# Patient Record
Sex: Male | Born: 1940 | Race: White | Hispanic: No | Marital: Married
Health system: Midwestern US, Community
[De-identification: ages and names within clinical notes are randomized; demographics above are authoritative.]

## PROBLEM LIST (undated history)

## (undated) DIAGNOSIS — J439 Emphysema, unspecified: Secondary | ICD-10-CM

## (undated) DIAGNOSIS — C61 Malignant neoplasm of prostate: Secondary | ICD-10-CM

## (undated) DIAGNOSIS — J449 Chronic obstructive pulmonary disease, unspecified: Secondary | ICD-10-CM

## (undated) DIAGNOSIS — I499 Cardiac arrhythmia, unspecified: Secondary | ICD-10-CM

## (undated) DIAGNOSIS — I4891 Unspecified atrial fibrillation: Secondary | ICD-10-CM

## (undated) DIAGNOSIS — R0600 Dyspnea, unspecified: Secondary | ICD-10-CM

## (undated) DIAGNOSIS — N390 Urinary tract infection, site not specified: Secondary | ICD-10-CM

## (undated) DIAGNOSIS — R3915 Urgency of urination: Secondary | ICD-10-CM

## (undated) HISTORY — PX: TONSILLECTOMY: SUR1361

## (undated) HISTORY — PX: EYE SURGERY: SHX253

## (undated) HISTORY — PX: RADIOACTIVE SEED IMPLANT: SHX5150

## (undated) HISTORY — PX: HAND SURGERY: SHX662

## (undated) HISTORY — PX: COLONOSCOPY: SHX174

---

## 2007-10-07 LAB — PSA SCREENING (SCREENING): Prostate Specific Ag: 3.06

## 2009-02-21 LAB — PSA SCREENING (SCREENING)

## 2009-06-19 LAB — CBC W/O DIFF
HCT: 46.5 % (ref 37.0–49.0)
HGB: 15.9 g/dL (ref 13.0–16.0)
MCH: 31.8 PG (ref 25.0–35.0)
MCHC: 34.2 g/dL (ref 31.0–37.0)
MCV: 93.1 FL (ref 78.0–98.0)
MPV: 7 FL — ABNORMAL LOW (ref 7.4–10.4)
PLATELET: 269 10*3/uL (ref 130–400)
RBC: 4.99 M/uL (ref 4.50–5.30)
RDW: 13.4 % (ref 11.5–14.5)
WBC: 5.9 10*3/uL (ref 4.5–13.0)

## 2009-06-19 LAB — METABOLIC PANEL, BASIC
Anion gap: 6 mmol/L (ref 5–15)
BUN/Creatinine ratio: 17 (ref 12–20)
BUN: 15 MG/DL (ref 7–18)
CO2: 27 MMOL/L (ref 21–32)
Calcium: 9 MG/DL (ref 8.4–10.4)
Chloride: 104 MMOL/L (ref 100–108)
Creatinine: 0.9 MG/DL (ref 0.6–1.3)
GFR est AA: >60 mL/min/{1.73_m2} (ref >60)
GFR est non-AA: >60 mL/min/{1.73_m2} (ref >60)
Glucose: 93 MG/DL (ref 74–99)
Potassium: 4.1 MMOL/L (ref 3.5–5.5)
Sodium: 137 MMOL/L (ref 136–145)

## 2009-06-25 NOTE — Op Note (Unsigned)
[facility]   [address] [place], [place] [address]     OPERATIVE REPORT    PATIENT: Cory Meyers, Cory Meyers  MRN [id number] DATE: 06/24/2009  BILLING: [id number] LOCATION: [id number]  ATTENDING: Donah Driver, MD  SURGEON: Donah Driver, MD      PREOPERATIVE DIAGNOSIS: Adenocarcinoma of the prostate, Gleason 6.    POSTOPERATIVE DIAGNOSIS: Adenocarcinoma of the prostate, Gleason 6.    PROCEDURE: Transperineal, percutaneous, ultrasound-guided radioactive seed  implant prostate utilizing I-125 seeds.    SURGEON: Donah Driver, MD    ANESTHESIA: General.    COMPLICATIONS: None.    SPECIMENS: None.    ESTIMATED BLOOD LOSS: Less than 25 mL.    DRAINS: None.    INTRAOPERATIVE FINDINGS: I-125 prostate seed implant performed. The office  volume was 46 cc, the OR volume was 41.6 cc. A total of 84 seeds were  placed via 26 needles for a total energy of 32.256 millicuries. There were  no seeds noted in the bladder, and there is good seed distribution noted on  biplanar fluoroscopy and prostate ultrasound. The patient tolerated the  procedure well.    INDICATIONS: This is an 69 year old male who has undergone evaluation by  his primary urologist, Dr. Mathews Argyle, and has decided on radioactive seed  implant. The patient now desires the seed implant today. He has been on  hormonal therapy to shrink his prostate and now presents for seed implant.    PROCEDURE IN DETAIL: The patient was taken to the cystoscopy suite, placed  on the cysto table in the supine position. General anesthesia was  administered. After adequate anesthesia, the patient was placed in the  dorsal lithotomy position, prepped and draped in the usual sterile fashion  using adequate padding for the extended dorsal lithotomy position and after  ensuring that there were no pressure points, transrectal ultrasonography  was performed using the [facility] transrectal ultrasound machine. After adequate   adjusting of the ultrasound armatures, and placing the needle guides for  the subsequent perineal placement, transverse imaging of the prostate was  performed from the base of the prostate to the apex. This gave [place] an  adequate volume and then longitudinal measurements were taken in 3 separate  positionings of the prostate to get the adequate numbers for dosimetry.    After adequate dosimetry, the 84 implants were prepared. Using the  ultrasound in longitudinal and transverse plane, the needles were first  placed peripherally in the peripheral zone of the prostate. Care was taken  to avoid rectal injury. After adequate needle placement, the seeds were  then implanted by Dr. Delton Prairie, per his separate dictation. Following this,  internal seeds were placed in a U-shape fashion with care to avoid the  urethra. The seeds were then implanted there as well. After adequate seed  placement, fluoroscopic visualization of the seeds was performed. There was  adequate seed placement with no obvious cold spots. With the Foley catheter  in place, dynamic cystogram was performed showing no evidence of seeds in  the bladder. The bladder was then drained. The Foley catheter was removed  and the patient was taken to the recovery room in stable condition.                               Date:______Time:______Signature________________________________   Donah Driver, MD    WHR:wmx  D: 06/24/2009 5:45 P T: 06/25/2009 2:48 A  Job#: [id number]  CScriptDoc #: [id number]  cc: Page Spiro, MD   Donah Driver, MD

## 2009-10-10 LAB — PSA SCREENING (SCREENING): Prostate Specific Ag: 0.03

## 2010-01-20 LAB — PSA SCREENING (SCREENING): Prostate Specific Ag: 0.13

## 2010-05-05 LAB — PSA, DIAGNOSTIC (PROSTATE SPECIFIC AG): Prostate Specific Ag: 0.18

## 2010-07-31 ENCOUNTER — Telehealth

## 2010-07-31 NOTE — Telephone Encounter (Signed)
Needs order for PSA mailed to patient

## 2010-08-13 LAB — PSA, DIAGNOSTIC (PROSTATE SPECIFIC AG): Prostate Specific Ag: 0.27

## 2010-08-25 LAB — AMB POC URINALYSIS DIP STICK AUTO W/O MICRO
Bilirubin (UA POC): NEGATIVE
Blood (UA POC): NEGATIVE
Glucose (UA POC): NEGATIVE
Ketones (UA POC): NEGATIVE
Nitrites (UA POC): NEGATIVE
Protein (UA POC): NEGATIVE mg/dL
Specific gravity (UA POC): 1.025 (ref 1.001–1.035)
Urobilinogen (UA POC): 0.2
pH (UA POC): 5.5 (ref 4.6–8.0)

## 2010-08-25 MED ORDER — TAMSULOSIN SR 0.4 MG 24 HR CAP
0.4 mg | ORAL_CAPSULE | Freq: Every day | ORAL | Status: DC
Start: 2010-08-25 — End: 2011-02-18

## 2010-08-25 NOTE — Patient Instructions (Signed)
MyChart Activation    Thank you for requesting access to MyChart. Please follow the instructions below to securely access and download your online medical record. MyChart allows you to send messages to your doctor, view your test results, renew your prescriptions, schedule appointments, and more.    How Do I Sign Up?    1. In your internet browser, go to [email or web address]  2. Click on the First Time User? Click Here link in the Sign In box. You will be redirect to the New Member Sign Up page.  3. Enter your MyChart Access Code exactly as it appears below. You will not need to use this code after you???ve completed the sign-up process. If you do not sign up before the expiration date, you must request a new code.    MyChart Access Code: [email or web address]  Expires: 11/23/2010 11:01 AM (This is the date your MyChart access code will expire)    4. Enter the last four digits of your Social Security Number (xxxx) and Date of Birth (mm/dd/yyyy) as indicated and click Submit. You will be taken to the next sign-up page.  5. Create a MyChart ID. This will be your MyChart login ID and cannot be changed, so think of one that is secure and easy to remember.  6. Create a MyChart password. You can change your password at any time.  7. Enter your Password Reset Question and Answer. This can be used at a later time if you forget your password.   8. Enter your e-mail address. You will receive e-mail notification when new information is available in MyChart.  9. Click Sign Up. You can now view and download portions of your medical record.  10. Click the Download Summary menu link to download a portable copy of your medical information.    Additional Information    If you have questions, please call [phone]. Remember, MyChart is NOT to be used for urgent needs. For medical emergencies, dial 911.

## 2010-08-25 NOTE — Progress Notes (Signed)
Assessment/Plan:             1. Malignant neoplasm of prostate  AMB POC URINALYSIS DIP STICK AUTO W/O MICRO   2. Urinary urgency  tamsulosin (FLOMAX) 0.4 mg capsule   3. Impotence of organic origin  AMB POC URINALYSIS DIP STICK AUTO W/O MICRO   4. Urine leukocytes  CULTURE, URINE     Return to clinic 3 month(s) for PSA    Discussion:   Can stop ditropan and reduced flomax to every day.        Subjective:   Cory Meyers is a 70 y.o. Caucasian male who is seen for follow up of CAP s/p I 125 seeds on 06/24/09.  Received LHRH 22.5mg  on 05/27/10.    Since the last visit, voiding without problems.    Associated Signs/Symptoms: nocturia x  1.  Minimal urgency symptoms.  On flomax bid and ditropan XL 5mg  every day.    Used Staxyn for ED but caused ? Tachycardia.      Past Medical History   Diagnosis Date   ??? Hypercholesteremia    ??? Hypertension    ??? COPD (chronic obstructive pulmonary disease)    ??? Malignant neoplasm of prostate 04/01/09           ??? Emphysema    ??? Recurrent UTI    ??? Slow urinary stream    ??? Urinary frequency      Past Surgical History   Procedure Date   ??? Hx septoplasty    ??? Pr finger tendon transfer,4-5 fingrs    ??? Biopsy prostate 04/01/09   ??? Pr surg expos,prostate,radioactiv insrt    ??? Hx tonsillectomy      No family history on file.  History     Social History   ??? Marital Status: Unknown     Spouse Name: N/A     Number of Children: N/A   ??? Years of Education: N/A     Occupational History   ??? Not on file.     Social History Main Topics   ??? Smoking status: Former Smoker -- 3.0 packs/day for 38 years     Quit date: 06/09/1995   ??? Smokeless tobacco: Never Used   ??? Alcohol Use: Yes   ??? Drug Use: No   ??? Sexually Active: Not on file     Other Topics Concern   ??? Not on file     Social History Narrative   ??? No narrative on file     Current Outpatient Prescriptions   Medication Sig   ??? diltiazem hcl 120 mg Tb24 Take  by mouth.     ??? diltiazem CD (CARDIZEM CD) 120 mg ER capsule Take  by mouth daily.      ??? tamsulosin (FLOMAX) 0.4 mg capsule Take 1 Cap by mouth daily.   ??? irbesartan (AVAPRO) 150 mg tablet Take 150 mg by mouth nightly.     ??? ezetimibe-simvastatin (VYTORIN 10/40) 10-40 mg per tablet Take 1 Tab by mouth nightly.     ??? tiotropium (SPIRIVA WITH HANDIHALER) 18 mcg inhalation capsule Take 1 Cap by inhalation daily.     ??? ASCORBATE CALCIUM (VITAMIN C PO) Take  by mouth.     ??? GLUC SU/CHONDRO SU A/VIT C/MN (GLUCOSAMINE CHONDROITIN MAXSTR PO) Take  by mouth.     ??? CA CITRATE/MGOX/VIT D3/B6/MIN (CALCIUM CITRATE + D WITH MAG PO) Take  by mouth.     ??? FOLIC ACID PO Take  by mouth.     ???  MULTIVITS-MINERALS/FA/LYCOPENE (ONE-A-DAY MEN'S PO) Take  by mouth.     ??? MILK THISTLE PO Take  by mouth.       No Known Allergies    Review of Systems    Constitutional: negative for fever, malaise/fatigue, weakness and weight loss  Eyes: negative for blurred vision and double vision  Ears, nose, mouth, throat, and face: negative for congestion and headaches  Respiratory: negative for cough, shortness of breath or sputum production  Cardiovascular: negative for chest pain and leg swelling  Gastrointestinal: negative for abdominal pain, diarrhea, nausea and vomiting  Skin: negative for itching and rash  Hematologic/lymphatic: negative for easy bruising/bleeding  Musculoskeletal:negative for joint pain and myalgias  Neurological: negative for dizziness, focal weakness and LOC      Objective:     Estimated Body mass index is 26.58 kg/(m^2) as calculated from the following:    Height as of this encounter: 5\' 9" (1.753 m).    Weight as of this encounter: 180 lb(81.647 kg).   BP 140/78   Ht 5\' 9"  (1.753 m)   Wt 180 lb (81.647 kg)   BMI 26.58 kg/m2  General appearance: alert, cooperative, no distress, appears stated age  Head: Normocephalic, without obvious abnormality, atraumatic  Male genitalia: normal  Rectal: Normal tone, fla firm prostate, no masses or tenderness  Extremities: extremities normal, atraumatic, no cyanosis or edema   Skin: Skin color, texture, turgor normal. No rashes or lesions    Results for orders placed in visit on 08/25/10   AMB POC URINALYSIS DIP STICK AUTO W/O MICRO       Component Value Range    Color Yellow  (none)     Clarity Clear  (none)     Glucose Negative  (none)     Bilirubin Negative  (none)     Ketones Negative  (none)     Spec.Grav. 1.025  1.001 - 1.035     Blood Negative  (none)     pH 5.5  4.6 - 8.0     Protein Neg  NWG:NFAOZHYQ(MV/HQ)    Urobilinogen 0.2 mg/dL      Nitrites Negative  (none)     Leukocyte esterase Trace  (none)          PSA /TESTOSTERONE - BSHSI PSA   Latest Ref Rng    02/21/2009 6.63(A)   06/19/2009    10/10/2009 0.03   01/20/2010 0.13   08/13/10    0.27    This note has been sent to the referring physician, with findings and recommendations.      Page Spiro, MD, MD, FACS          CC:  Page Spiro, MD  [facility] [address]  [place] [address]

## 2010-08-27 MED ORDER — NITROFURANTOIN (25% MACROCRYSTAL FORM) 100 MG CAP
100 mg | ORAL_CAPSULE | Freq: Two times a day (BID) | ORAL | Status: AC
Start: 2010-08-27 — End: 2010-09-03

## 2010-08-27 NOTE — Telephone Encounter (Signed)
Enterococcus UTI,  macrobid bid x 7 days.  Rx faxed to pharmacy of choice, please notify patient to pick up and take as directed

## 2010-08-28 NOTE — Telephone Encounter (Signed)
Pt told

## 2010-09-25 NOTE — Telephone Encounter (Signed)
Patient called wanting to let us know that he had a bladder infection and that he went to [facility]  Was given Cipro and they will forward a copy of the result to [place]

## 2010-10-03 ENCOUNTER — Telehealth

## 2010-10-03 NOTE — Telephone Encounter (Signed)
Pt called and left a message stating that the Cipro did not help with his UTI, he is still having same sx's (back pain, bladder pressure).  Tried calling pt back, will try again on Monday - pt needs a repeat culture per Dr Verdie Mosher (his uti was sensitive to Cipro and the Cipro should have worked).

## 2010-10-06 NOTE — Telephone Encounter (Signed)
Spoke to patient and faxed over a request for urine culture to be done at [facility].  Patient is concern about how freq. He is going to the bathroom.  I instructed the patient get a urine culture at Dr. Theodora Blow request and try AZO for symptoms until we can get the results back.  Patient understood.

## 2010-10-10 MED ORDER — AMPICILLIN 500 MG CAP
500 mg | ORAL_CAPSULE | Freq: Four times a day (QID) | ORAL | Status: AC
Start: 2010-10-10 — End: 2010-10-20

## 2010-10-10 NOTE — Telephone Encounter (Signed)
Called pt with urine cx results reviewed by Dr Janalyn Rouse - faxed Ampicillin 500 mg qid x 10 days.

## 2010-10-22 LAB — AMB POC URINALYSIS DIP STICK AUTO W/O MICRO
Bilirubin (UA POC): NEGATIVE
Blood (UA POC): NEGATIVE
Glucose (UA POC): NEGATIVE
Ketones (UA POC): NEGATIVE
Leukocyte esterase (UA POC): NEGATIVE
Nitrites (UA POC): NEGATIVE
Protein (UA POC): NEGATIVE mg/dL
Specific gravity (UA POC): 1.02 (ref 1.001–1.035)
Urobilinogen (UA POC): 1
pH (UA POC): 7 (ref 4.6–8.0)

## 2010-10-22 LAB — AMB POC PVR, MEAS,POST-VOID RES,US,NON-IMAGING: PVR: 60 cc

## 2010-10-22 MED ORDER — OXYBUTYNIN CHLORIDE 5 MG TAB
5 mg | ORAL_TABLET | Freq: Two times a day (BID) | ORAL | Status: DC
Start: 2010-10-22 — End: 2010-12-29

## 2010-10-22 NOTE — Progress Notes (Signed)
Progress Notes     Page Spiro, MD  01/29/10 11:15 AM  Signed  70 y.o. White Male follow up for his prostate cancer.  H/o elevated PSA 6.63 on 02/21/09. Underwent PNBx by Dr. Lillia Corporal on 04/01/09 and found to have CaP gleason 3+3 from left mid gland, clinical T1c.  S/p I 152 seeds on 06/24/09.   C/o persistent increased urinary frequency/urgency with bothersome urgency to urinate or deficate.  Nocturia x 1 On flomax bid.        Had enterococcus UTI's in March treated with macrobid and treated for another UTI per PCP approx 3 weeks ago for dysuria/urgency .  S/p unremarkable  diagnostic cysto 10/18/2009.            Past Medical History    Diagnosis  Date    ???  Hypercholesteremia      ???  Hypertension      ???  COPD      ???  Malignant neoplasm of prostate  04-01-09        Gleason 6(3+3) T1c    ???  Emphysema      ???  Recurrent UTI      ???  Slow urinary stream      ???  Urinary frequency                Past Surgical History    Procedure  Date    ???  Hx septoplasty      ???  Pr finger tendon transfer,4-5 fingrs          right    ???  Biopsy prostate  04-01-09        Gleason 6(3+3)Left mid, Dr Onalee Hua    ???  Pr surg expos,prostate,radioactiv insrt          [facility], I-125 seed implant, Dr Janalyn Rouse    ???  Hx tonsillectomy                Family History    Problem  Relation  Age of Onset    ???  Heart Disease  Father      ???  Stroke  Father      ???  Cancer  Mother      ???  Cancer  Sister      ???  Cancer, Breast  Sister                History    Social History    ???  Marital Status:  N/A        Spouse Name:  N/A        Number of Children:  N/A    ???  Years of Education:  N/A    Occupational History    ???  Not on file.    Social History Main Topics    ???  Tobacco Use:  Not on file    ???  Alcohol Use:  Not on file    ???  Drug Use:  Not on file    ???  Sexually Active:  Not on file    Other Topics  Concern    ???  Not on file    Social History Narrative    ???  No narrative on file              Current outpatient prescriptions    Medication  Sig  Dispense  Refill     ???  irbesartan (AVAPRO) 150 mg PO TABS  Take 150 mg by  Mouth Once a Day.          ???  tamsulosin (FLOMAX) 0.4 mg PO CP24  Take 1 Cap by Mouth 2 Times Daily After Meals. One capsule daily 30 minutes after supper   180 Cap   3    ???  Ezetimibe-Simvastatin (VYTORIN 10/40) 10-40 mg PO TABS  Take 1 Tab by Mouth.          ???  tiotropium (SPIRIVA WITH HANDIHALER) 18 mcg INH CpDv  Take 1 Cap inhaled by mouth Once a Day.          ???  ASCORBIC ACID (VITAMIN C PO)  Take  by Mouth.          ???  Glucosamine-Chondroit-Vit C-Mn (GLUCOSAMINE CHONDROITIN MAXSTR) 500-400 mg PO CAPS  Take 1 Cap by Mouth Twice Daily.          ???  CA CITRATE/MGOX/VIT D3/B6/MIN (CALCIUM CITRATE + D WITH MAG PO)  Take  by Mouth.          ???  FOLIC ACID PO  Take  by Mouth.          ???  MULTIVITAMIN (ONE-A-DAY MENS PO)  Take  by Mouth.          ???  MILK THISTLE PO  Take  by Mouth.                        No Known Allergies          Review of System:      Constitutional: negative for fever, malaise/fatigue, weakness and weight loss  Eyes: negative for blurred vision and double vision  Ears, nose, mouth, throat, and face: negative for congestion and headaches  Respiratory: negative for cough, shortness of breath or sputum production  Cardiovascular: negative for chest pain and leg swelling  Gastrointestinal: negative for abdominal pain, diarrhea, nausea and vomiting  Integument/breast: negative for itching and rash  Hematologic/lymphatic: negative for easy bruising/bleeding  Musculoskeletal:negative for falls and myalgias  Neurological: negative for dizziness, focal weakness and LOC          Physical Exam:      Constitutional: WDWN, Pleasant and appropriate affect and No acute distress  HEENT:  Symmetric and Supple  Resp: Normal effort  CV:  No peripheral swelling noted  Abdomen:  No abdominal masses or tenderness.  GU: normal circ phallus and testes  DRE: firm nontender prostate, 20g   Skin: Normal color and texture and No rashes or erythema noted   Neuro/Psych:  Alert and Oriented x3, affect appropriate.                  Data:  PSA DIAGNOSTIC    Date  Value  Range  Status    01/20/2010  0.13   0.0-4.0 (ng/mL)  Final    10/10/2009  0.03   0.0-4.0 (ng/mL)  Final    02/21/2009  6.63*  0.0-4.0 (ng/mL)  Final    10/07/2007  3.06   0.0-4.0 (ng/mL)  Final          PSA  0.03       Results for orders placed in visit on 01/29/10    URINALYSIS    Component  Value  Range    ???  SPECIMEN TYPE  urine       ???  URINE COLOR  yellow       ???  URINE TURBIDITY          ???  URINE GLUCOSE  neg   Negative - Negative (mg/dL)    ???  URINE BILIRUBIN  neg   Negative - Negative (Positive/Negative)    ???  URINE KETONES  neg   Negative - Negative (mg/dL)    ???  URINE SPECIFIC GRAVITY  1.020   1.003 - 1.030     ???  URINE OCCULT BLOOD  neg   Negative - Negative (Positive/Negative)    ???  URINE pH  7.5 (*)  5.0 - 7.0     ???  URINE PROTEIN SCREEN  neg   Negative - Negative (mg/dL)    ???  URINE UROBILINOGEN  1.0   0.3 - 2.0 (mg/dL)    ???  URINE NITRITE  neg   Negative - Negative (Positive/Negative)    ???  URINE LEUKOCYTE ESTERASE  neg   Negative - Negative (Positive/Negative)    UVA SYMPTOMS SCORE    Component  Value  Range    ???  IBE UVA  2   0 - 5     ???  FREQUENCY UVA  4   0 - 5     ???  INTERMITTENCY UVA  3   0 - 5     ???  URGENCY UVA  5   0 - 5     ???  WEAK STREAM UVA  1   0 - 5     ???  STRANGURIA UVA  0   0 - 5     ???  NOCTURIA UVA  1   0 - >=5 (times)    ???  TOTAL UVA SYMPTOMS SCORE  16   0 - 35     ???  BOTHER UVA  4   0 - 6     PR MEAS, POST-VOID RES, [place], NON-IMAGING    Component  Value  Range    ???  PVR  33 ml             08/26/09 CULTURE FLAG (Abnormal): FINAL REPORT: GREATER THAN 100,000 ORG/ML ENTEROCOCCUS SPECIES ORGANISM: ENTEROCOCCUS SPECIES      01/13/10  Urine cultureEnterococcus          Impression/Plan:      1.  Malignant neoplasm of prostate (185)   Machine Read Dip W/O Micro    2.  Urinary urgency (788.63A)   Machine Read Dip W/O Micro, UVA Symptoms Score, Measure RU Non-Image U/S     3.  Urinary frequency (788.41)   Machine Read Dip W/O Micro, UVA Symptoms Score, Measure RU Non-Image U/S          CaP gleason 3+3  1/12 cores from left mid, clinical T1c  S/p I 125 seeds on 06/24/09.   postop LUTS on flomax bid.  Will add vesicare 5mg  qd for irritative symptoms  F/u in 3 months with repeat PSA      H/o recurrent enterococcus UT, unremarkable cysto 10/18/09.  Will check CT urogram                 CC:  Diona Fanti  [address]  [place] [address]

## 2010-10-22 NOTE — Progress Notes (Signed)
Assessment/Plan:             1. Recurrent UTI  CULTURE, URINE   2. Dysuria  AMB POC URINALYSIS DIP STICK AUTO W/O MICRO, AMB POC PVR, MEAS,POST-VOID RES,US,NON-IMAGING, CULTURE, URINE   3. Urinary frequency  AMB POC URINALYSIS DIP STICK AUTO W/O MICRO, AMB POC PVR, MEAS,POST-VOID RES,US,NON-IMAGING   4. Urgency of urination  AMB POC URINALYSIS DIP STICK AUTO W/O MICRO, AMB POC PVR, MEAS,POST-VOID RES,US,NON-IMAGING   5. Personal history of malignant neoplasm of prostate         Recurrent enterococcus UTI's.  Finished ampicillin.  Repeat urine culture today.  Can use ditropan 5mg  bid for urgency symptoms.  Continue flomax every day.  Prior negative CTU and cysto in 2011.    F/u in July with repeat PSA as planned.        Subjective:   Cory Meyers is a 70 y.o. Caucasian male who is seen for follow up of CAP s/p I 125 seeds on 06/24/09.  Received LHRH 22.5mg  on 05/27/10.    Postop irritative voiding symptoms had imrproved with nocturia down to 1x/night.  Minimal urgency symptoms.  Had stopped ditropan and redcued  flomax daily    Now presents with recurrent UTI's.  C/o increased urinary urgency with urge incontinence.  Dysuria resolved after finishing ampicillin.  H/o recurrent enterococcus UTI treated with macrobid in March and finished ampicillin yesterday for positive urine culture 10/06/10.  Negative office cysto 10/18/09 and had CT in 02/2010 which only showed right renal cyst        Past Medical History   Diagnosis Date   ??? Hypercholesteremia    ??? Hypertension    ??? COPD (chronic obstructive pulmonary disease)    ??? 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 Emphysema    ??? Recurrent UTI    ??? Slow urinary stream    ??? Urinary frequency      Past Surgical History   Procedure Date   ??? Hx septoplasty    ??? Pr finger tendon transfer,4-5 fingrs    ??? Biopsy prostate 04/01/09     Gleason 6(3+3)Left mid, Dr Onalee Hua    ??? Pr surg expos,prostate,radioactiv insrt       [facility], I-125 seed implant, Dr Janalyn Rouse    ??? Hx tonsillectomy      No family history on file.  History     Social History   ??? Marital Status: Unknown     Spouse Name: N/A     Number of Children: N/A   ??? Years of Education: N/A     Occupational History   ??? Not on file.     Social History Main Topics   ??? Smoking status: Former Smoker -- 3.0 packs/day for 38 years     Quit date: 06/09/1995   ??? Smokeless tobacco: Never Used   ??? Alcohol Use: Yes   ??? Drug Use: No   ??? Sexually Active: Not on file     Other Topics Concern   ??? Not on file     Social History Narrative   ??? No narrative on file     Current Outpatient Prescriptions   Medication Sig   ??? simvastatin (ZOCOR) 40 mg tablet Take  by mouth nightly.     ??? losartan (COZAAR) 50 mg tablet Take  by mouth daily.     ??? oxybutynin (DITROPAN) 5 mg tablet Take  1 Tab by mouth two (2) times a day.   ??? tiotropium (SPIRIVA WITH HANDIHALER) 18 mcg inhalation capsule Take 1 Cap by inhalation daily.     ??? ASCORBATE CALCIUM (VITAMIN C PO) Take  by mouth.     ??? GLUC SU/CHONDRO SU A/VIT C/MN (GLUCOSAMINE CHONDROITIN MAXSTR PO) Take  by mouth.     ??? CA CITRATE/MGOX/VIT D3/B6/MIN (CALCIUM CITRATE + D WITH MAG PO) Take  by mouth.     ??? FOLIC ACID PO Take  by mouth.     ??? MULTIVITS-MINERALS/FA/LYCOPENE (ONE-A-DAY MEN'S PO) Take  by mouth.     ??? MILK THISTLE PO Take  by mouth.     ??? tamsulosin (FLOMAX) 0.4 mg capsule Take 1 Cap by mouth daily.     No Known Allergies    Review of Systems    Constitutional: negative for fever, malaise/fatigue, weakness and weight loss  Eyes: negative for blurred vision and double vision  Ears, nose, mouth, throat, and face: negative for congestion and headaches  Respiratory: negative for cough, shortness of breath or sputum production  Cardiovascular: negative for chest pain and leg swelling  Gastrointestinal: negative for abdominal pain, diarrhea, nausea and vomiting  Skin: negative for itching and rash   Hematologic/lymphatic: negative for easy bruising/bleeding  Musculoskeletal:negative for joint pain and myalgias  Neurological: negative for dizziness, focal weakness and LOC      Objective:     Estimated Body mass index is 25.84 kg/(m^2) as calculated from the following:    Height as of this encounter: 5\' 9" (1.753 m).    Weight as of this encounter: 175 lb(79.379 kg).   BP 152/86   Ht 5\' 9"  (1.753 m)   Wt 175 lb (79.379 kg)   BMI 25.84 kg/m2  General appearance: alert, cooperative, no distress, appears stated age  Head: Normocephalic, without obvious abnormality, atraumatic  Abdom: soft and benign  Extremities: extremities normal, atraumatic, no cyanosis or edema  Skin: Skin color, texture, turgor normal. No rashes or lesions    Results for orders placed in visit on 10/22/10   AMB POC URINALYSIS DIP STICK AUTO W/O MICRO       Component Value Range    Color Yellow  (none)     Clarity Clear  (none)     Glucose Negative  (none)     Bilirubin Negative  (none)     Ketones Negative  (none)     Spec.Grav. 1.020  1.001 - 1.035     Blood Negative  (none)     pH 7.0  4.6 - 8.0     Protein Neg  ZOX:[id number](JW/JX)    Urobilinogen 1 mg/dL      Nitrites Negative  (none)     Leukocyte esterase Negative  (none)    AMB POC PVR, MEAS,POST-VOID RES,US,NON-IMAGING       Component Value Range    PVR 60           PSA /TESTOSTERONE - BSHSI PSA   Latest Ref Rng    02/21/2009 6.63(A)   06/19/2009    10/10/2009 0.03   01/20/2010 0.13    08/13/10    0.27    This note has been sent to the referring physician, with findings and recommendations.      Page Spiro, MD, MD, FACS          CC:  Andee Lineman, MD  [address] [place] [address]

## 2010-12-19 ENCOUNTER — Telehealth

## 2010-12-19 NOTE — Telephone Encounter (Signed)
Patient called me back faxed order to [facility] at [phone]

## 2010-12-19 NOTE — Telephone Encounter (Signed)
Patient called wanting to know if he can get his PSA drawn on his appointment date on July 23 or will need to get it before hand.  I called patient and left a message to return my call on where he wants to go for his PSA

## 2010-12-22 LAB — PSA SCREENING (SCREENING): Prostate Specific Ag: 0.92

## 2010-12-29 LAB — AMB POC URINALYSIS DIP STICK AUTO W/O MICRO
Bilirubin (UA POC): NEGATIVE
Glucose (UA POC): NEGATIVE
Ketones (UA POC): NEGATIVE
Leukocyte esterase (UA POC): NEGATIVE
Nitrites (UA POC): NEGATIVE
Protein (UA POC): NEGATIVE mg/dL
Specific gravity (UA POC): 1.025 (ref 1.001–1.035)
Urobilinogen (UA POC): 1
pH (UA POC): 6.5 (ref 4.6–8.0)

## 2010-12-29 LAB — AMB POC PVR, MEAS,POST-VOID RES,US,NON-IMAGING: PVR: 83 cc

## 2010-12-29 MED ORDER — SOLIFENACIN 10 MG TAB
10 mg | ORAL_TABLET | Freq: Every day | ORAL | Status: AC
Start: 2010-12-29 — End: 2011-01-28

## 2010-12-29 NOTE — Patient Instructions (Signed)
MyChart Activation    Thank you for requesting access to MyChart. Please follow the instructions below to securely access and download your online medical record. MyChart allows you to send messages to your doctor, view your test results, renew your prescriptions, schedule appointments, and more.    How Do I Sign Up?    1. In your internet browser, go to [email or web address]  2. Click on the First Time User? Click Here link in the Sign In box. You will be redirect to the New Member Sign Up page.  3. Enter your MyChart Access Code exactly as it appears below. You will not need to use this code after you???ve completed the sign-up process. If you do not sign up before the expiration date, you must request a new code.    MyChart Access Code: Not generated  Current MyChart Status: Active (This is the date your MyChart access code will expire)    4. Enter the last four digits of your Social Security Number (xxxx) and Date of Birth (mm/dd/yyyy) as indicated and click Submit. You will be taken to the next sign-up page.  5. Create a MyChart ID. This will be your MyChart login ID and cannot be changed, so think of one that is secure and easy to remember.  6. Create a MyChart password. You can change your password at any time.  7. Enter your Password Reset Question and Answer. This can be used at a later time if you forget your password.   8. Enter your e-mail address. You will receive e-mail notification when new information is available in MyChart.  9. Click Sign Up. You can now view and download portions of your medical record.  10. Click the Download Summary menu link to download a portable copy of your medical information.    Additional Information    If you have questions, please visit the Frequently Asked Questions section of the MyChart website at [email or web address] Remember, MyChart is NOT to be used for urgent needs. For medical emergencies, dial 911.

## 2010-12-29 NOTE — Progress Notes (Signed)
Assessment/Plan:             1. Personal history of malignant neoplasm of prostate  AMB POC URINALYSIS DIP STICK AUTO W/O MICRO   2. Recurrent UTI  AMB POC URINALYSIS DIP STICK AUTO W/O MICRO   3. Microscopic hematuria     4. Urinary frequency  AMB POC PVR, MEAS,POST-VOID RES,US,NON-IMAGING, solifenacin (VESICARE) 10 mg tablet   5. Urinary urgency  AMB POC URINALYSIS DIP STICK AUTO W/O MICRO, AMB POC PVR, MEAS,POST-VOID RES,US,NON-IMAGING, solifenacin (VESICARE) 10 mg tablet   6. Dysuria  CULTURE, URINE       h/o enterococcus UTI's.   No recurrence over past year.  Mild dysuria. Repeat culture todaay  Persistent bothersome urgency symptoms on ditropan.  Will try switching to vesicare 10mg  daily  Continue flomax 0.4mg  every day.  Prior negative CTU and cysto in 2011.    S/p I 125 seeds 06/24/09.  PSA rise.  Will follow conservatively for now and repeat PSA in 4 months.  Will consider PNBx for local recurrence if persistent rise.        Subjective:   Cory Meyers is a 70 y.o. Caucasian male who is seen for follow up of CAP s/p I 125 seeds on 06/24/09.  Received LHRH 22.5mg  on 05/27/10.  Pt has had bothersom urgency, dysuria, urge incontinence and sense PVR.  Day freq q < one hour, most frequent when first gets up, in afternoon voiding q 2 hours, nocturia X1 and dry when wakes in am. No documented UTI since  09/2010. Moderate urinary stream, No straining to start stream.     Currently on ditropan 5mg  daily and reduced  flomax to 0.4mg  daily    H/o recurrent enterococcus UTI treated with macrobid in March and finished ampicillin yesterday for positive urine culture 10/06/10.  Negative office cysto 10/18/09 and had CT in 02/2010 which only showed right renal cyst      Past Medical History   Diagnosis Date   ??? Hypercholesteremia    ??? Hypertension    ??? COPD (chronic obstructive pulmonary disease)    ??? 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 Emphysema    ??? Recurrent UTI       negative CTU and cysto in 2011   ??? Slow urinary stream    ??? Urinary frequency    ??? Melanoma in situ of back      Past Surgical History   Procedure Date   ??? Hx septoplasty    ??? Pr finger tendon transfer,4-5 fingrs    ??? Biopsy prostate 04/01/09     Gleason 6(3+3)Left mid, Dr Onalee Hua    ??? Pr surg expos,prostate,radioactiv insrt      [facility], I-125 seed implant, Dr Janalyn Rouse    ??? Hx tonsillectomy    ??? Hx other surgical 12-25-10     Melanoma removed from back     No family history on file.  History     Social History   ??? Marital Status: Married     Spouse Name: N/A     Number of Children: N/A   ??? Years of Education: N/A     Occupational History   ??? Not on file.     Social History Main Topics   ??? Smoking status: Former Smoker -- 3.0 packs/day for 38 years     Quit date: 06/09/1995   ??? Smokeless tobacco: Never Used   ???  Alcohol Use: Yes      A lttle everyday.   ??? Drug Use: No   ??? Sexually Active: Not on file     Other Topics Concern   ??? Not on file     Social History Narrative   ??? No narrative on file     Current Outpatient Prescriptions   Medication Sig   ??? solifenacin (VESICARE) 10 mg tablet Take 1 Tab by mouth daily for 30 doses.   ??? simvastatin (ZOCOR) 40 mg tablet Take  by mouth nightly.     ??? losartan (COZAAR) 50 mg tablet Take  by mouth daily.     ??? tamsulosin (FLOMAX) 0.4 mg capsule Take 1 Cap by mouth daily.   ??? tiotropium (SPIRIVA WITH HANDIHALER) 18 mcg inhalation capsule Take 1 Cap by inhalation daily.     ??? ASCORBATE CALCIUM (VITAMIN C PO) Take  by mouth.     ??? GLUC SU/CHONDRO SU A/VIT C/MN (GLUCOSAMINE CHONDROITIN MAXSTR PO) Take  by mouth.     ??? CA CITRATE/MGOX/VIT D3/B6/MIN (CALCIUM CITRATE + D WITH MAG PO) Take  by mouth.     ??? FOLIC ACID PO Take  by mouth.     ??? MULTIVITS-MINERALS/FA/LYCOPENE (ONE-A-DAY MEN'S PO) Take  by mouth.     ??? MILK THISTLE PO Take  by mouth.       No Known Allergies    Review of Systems    Constitutional: negative for fever, malaise/fatigue, weakness and weight loss   Eyes: negative for blurred vision and double vision  Ears, nose, mouth, throat, and face: negative for congestion and headaches  Respiratory: negative for cough, shortness of breath or sputum production  Cardiovascular: negative for chest pain and leg swelling  Gastrointestinal: negative for abdominal pain, diarrhea, nausea and vomiting  Skin: negative for itching and rash  Hematologic/lymphatic: negative for easy bruising/bleeding  Musculoskeletal:negative for joint pain and myalgias  Neurological: negative for dizziness, focal weakness and LOC      Objective:     Estimated Body mass index is 24.02 kg/(m^2) as calculated from the following:    Height as of this encounter: 5' 9.5"(1.765 m).    Weight as of this encounter: 165 lb(74.844 kg).   BP 144/86   Ht 5' 9.5" (1.765 m)   Wt 165 lb (74.844 kg)   BMI 24.02 kg/m2      General appearance: alert, cooperative, no distress, appears stated age  Head: Normocephalic, without obvious abnormality, atraumatic  Chest: normal resp effort  Abdom: soft and benign  DRE: 20g firm smooth gland  Extremities: extremities normal, atraumatic, no cyanosis or edema  Skin: Skin color, texture, turgor normal. No rashes or lesions  Neuro:  AAO x 3      Results for orders placed in visit on 12/29/10   AMB POC URINALYSIS DIP STICK AUTO W/O MICRO       Component Value Range    Color Yellow  (none)     Clarity Clear  (none)     Glucose Negative  (none)     Bilirubin Negative  (none)     Ketones Negative  (none)     Spec.Grav. 1.025  1.001 - 1.035     Blood 1+  (none)     pH 6.5  4.6 - 8.0     Protein Neg  LOV:FIEPPIRJ(JO/AC)    Urobilinogen 1 mg/dL      Nitrites Negative  (none)     Leukocyte esterase Negative  (none)  AMB POC PVR, MEAS,POST-VOID RES,US,NON-IMAGING       Component Value Range    PVR 83       Component       Prostate Specific Ag   Latest Ref Rng          08/13/2010      12:00 AM 0.27   05/05/2010      12:00 AM 0.18   01/20/2010      4:10 PM 0.13   10/10/2009      4:10 PM 0.03    02/21/2009      4:09 PM 6.63(A)   10/07/2007      4:09 PM 3.06         This note has been sent to the referring physician, with findings and recommendations.    Mathews Argyle, MD, FACS          CC:  Wynelle Link JR  [address]  [place] [address]

## 2010-12-31 ENCOUNTER — Telehealth

## 2010-12-31 MED ORDER — CIPROFLOXACIN 500 MG TAB
500 mg | ORAL_TABLET | Freq: Two times a day (BID) | ORAL | Status: AC
Start: 2010-12-31 — End: 2011-01-07

## 2010-12-31 NOTE — Telephone Encounter (Signed)
Recurrent enterococcus UTI's.  cipro bid x 7 days.  May need repeat cysto under anesthesia and retrograde pyelograms if UTi recurs again.

## 2011-01-01 NOTE — Telephone Encounter (Signed)
Pt told

## 2011-01-29 MED ORDER — SOLIFENACIN 10 MG TAB
10 mg | ORAL_TABLET | Freq: Every day | ORAL | Status: DC
Start: 2011-01-29 — End: 2011-02-18

## 2011-01-29 NOTE — Telephone Encounter (Signed)
Patient called requesting a 90 day script be called into [facility] mail order pharmacy. Vesicar 10 mg e-faxed to [facility] per Dr. Theodora Blow order. Patient made aware.

## 2011-02-18 LAB — AMB POC URINALYSIS DIP STICK AUTO W/O MICRO
Bilirubin (UA POC): NEGATIVE
Blood (UA POC): NEGATIVE
Glucose (UA POC): NEGATIVE
Ketones (UA POC): NEGATIVE
Leukocyte esterase (UA POC): NEGATIVE
Nitrites (UA POC): NEGATIVE
Protein (UA POC): NEGATIVE mg/dL
Specific gravity (UA POC): 1.02 (ref 1.001–1.035)
Urobilinogen (UA POC): 0.2
pH (UA POC): 6.5 (ref 4.6–8.0)

## 2011-02-18 LAB — AMB POC UROFLOWMETRY
AVG FLOW RATE: 2 ml/Seconds
FLOW TIME: 10 Seconds
MAX FLOW RATE: 1 ml/Seconds
TIME TO MAX, POC: 0 Seconds
VOIDED VOLUME: 21 ml
VOIDING TIME: 14 Seconds

## 2011-02-18 LAB — AMB POC PVR, MEAS,POST-VOID RES,US,NON-IMAGING: PVR: 463 cc

## 2011-02-18 MED ORDER — TAMSULOSIN SR 0.4 MG 24 HR CAP
0.4 mg | ORAL_CAPSULE | Freq: Two times a day (BID) | ORAL | Status: DC
Start: 2011-02-18 — End: 2012-05-09

## 2011-02-18 NOTE — Patient Instructions (Signed)
MyChart Activation    Thank you for requesting access to MyChart. Please follow the instructions below to securely access and download your online medical record. MyChart allows you to send messages to your doctor, view your test results, renew your prescriptions, schedule appointments, and more.    How Do I Sign Up?    1. In your internet browser, go to [email or web address]  2. Click on the First Time User? Click Here link in the Sign In box. You will be redirect to the New Member Sign Up page.  3. Enter your MyChart Access Code exactly as it appears below. You will not need to use this code after you???ve completed the sign-up process. If you do not sign up before the expiration date, you must request a new code.    MyChart Access Code: Not generated  Current MyChart Status: Active (This is the date your MyChart access code will expire)    4. Enter the last four digits of your Social Security Number (xxxx) and Date of Birth (mm/dd/yyyy) as indicated and click Submit. You will be taken to the next sign-up page.  5. Create a MyChart ID. This will be your MyChart login ID and cannot be changed, so think of one that is secure and easy to remember.  6. Create a MyChart password. You can change your password at any time.  7. Enter your Password Reset Question and Answer. This can be used at a later time if you forget your password.   8. Enter your e-mail address. You will receive e-mail notification when new information is available in MyChart.  9. Click Sign Up. You can now view and download portions of your medical record.  10. Click the Download Summary menu link to download a portable copy of your medical information.    Additional Information    If you have questions, please visit the Frequently Asked Questions section of the MyChart website at [email or web address] Remember, MyChart is NOT to be used for urgent needs. For medical emergencies, dial 911.

## 2011-02-18 NOTE — Progress Notes (Signed)
Assessment/Plan:             1. Recurrent UTI  ciprofloxacin (CIPRO) 500 mg tablet, AMB POC URINALYSIS DIP STICK AUTO W/O MICRO, PROSTATE SPECIFIC AG (PSA), METABOLIC PANEL, BASIC   2. Personal history of malignant neoplasm of prostate  AMB POC URINALYSIS DIP STICK AUTO W/O MICRO   3. Urinary frequency  AMB POC URINALYSIS DIP STICK AUTO W/O MICRO, AMB POC UROFLOWMETRY, AMB POC PVR, MEAS,POST-VOID RES,US,NON-IMAGING   4. Incomplete bladder emptying  AMB POC URINALYSIS DIP STICK AUTO W/O MICRO, AMB POC UROFLOWMETRY, AMB POC PVR, MEAS,POST-VOID RES,US,NON-IMAGING   5. Urinary urgency  tamsulosin (FLOMAX) 0.4 mg capsule       Persistent enterococcus UTI's.   Prior negative CTU and cysto in 2011.   Persistent bothersome urgency symptoms on ditropan.  Will try switching to vesicare 10mg  daily  resume flomax 0.4mg  bid.  Need to stop ditropan and vesicare.  Will plan on repeat cysto with retrogrades and RUG/cystogram under anesthesia.  R/o rectourethral fistula.  Risks and benefits explained  Repeat urine culture today.  Finish current Rx cipro.    S/p I 125 seeds 06/24/09.  PSA rise.  Following conservatively for now.    Will consider PNBx for local recurrence if persistent rise.        Subjective:   Cory Meyers is a 70 y.o. Caucasian male who is seen for follow up of CAP s/p I 125 seeds on 06/24/09.  Received LHRH 22.5mg  on 05/27/10.  Pt has had bothersom urgency, dysuria, urge incontinence and sense PVR.  Day freq q < one hour, most frequent when first gets up, in afternoon voiding q 2 hours, nocturia X1 and dry when wakes in am. No documented UTI since  09/2010. Moderate urinary stream, No straining to start stream.     Currently on ditropan 5mg  bid and reduced  flomax to 0.4mg  daily    H/o recurrent enterococcus UTI treated with macrobid in March and finished ampicillin yesterday for positive urine culture 10/06/10.  Negative office cysto 10/18/09 and had CT in 02/2010 which only showed right renal cyst   Documented enterococcus UTI again on 02/10/11 with dysuria, currently on cipro.    Past Medical History   Diagnosis Date   ??? Hypercholesteremia    ??? Hypertension    ??? COPD (chronic obstructive pulmonary disease)    ??? 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 Emphysema    ??? Recurrent UTI      negative CTU and cysto in 2011   ??? Slow urinary stream    ??? Urinary frequency    ??? Melanoma in situ of back    ??? Dysuria    ??? Weight loss, unintentional      -20lbs since February     Past Surgical History   Procedure Date   ??? Hx septoplasty    ??? Pr finger tendon transfer,4-5 fingrs    ??? Biopsy prostate 04/01/09     Gleason 6(3+3)Left mid, Dr Onalee Hua    ??? Pr surg expos,prostate,radioactiv insrt      [facility], I-125 seed implant, Dr Janalyn Rouse    ??? Hx tonsillectomy    ??? Hx other surgical 12-25-10     Melanoma removed from back     No family history on file.  History     Social History   ??? Marital Status: Married     Spouse Name:  N/A     Number of Children: N/A   ??? Years of Education: N/A     Occupational History   ??? Not on file.     Social History Main Topics   ??? Smoking status: Former Smoker -- 3.0 packs/day for 38 years     Quit date: 06/09/1995   ??? Smokeless tobacco: Never Used   ??? Alcohol Use: Yes      A lttle everyday.   ??? Drug Use: No   ??? Sexually Active: Not on file     Other Topics Concern   ??? Not on file     Social History Narrative   ??? No narrative on file     Current Outpatient Prescriptions   Medication Sig   ??? ciprofloxacin (CIPRO) 500 mg tablet Take  by mouth two (2) times a day.     ??? tamsulosin (FLOMAX) 0.4 mg capsule Take 1 Cap by mouth two (2) times daily (after meals).   ??? simvastatin (ZOCOR) 40 mg tablet Take  by mouth nightly.     ??? losartan (COZAAR) 50 mg tablet Take  by mouth daily.     ??? tiotropium (SPIRIVA WITH HANDIHALER) 18 mcg inhalation capsule Take 1 Cap by inhalation daily.     ??? ASCORBATE CALCIUM (VITAMIN C PO) Take  by mouth.      ??? GLUC SU/CHONDRO SU A/VIT C/MN (GLUCOSAMINE CHONDROITIN MAXSTR PO) Take  by mouth.     ??? CA CITRATE/MGOX/VIT D3/B6/MIN (CALCIUM CITRATE + D WITH MAG PO) Take  by mouth.     ??? FOLIC ACID PO Take  by mouth.     ??? MULTIVITS-MINERALS/FA/LYCOPENE (ONE-A-DAY MEN'S PO) Take  by mouth.     ??? MILK THISTLE PO Take  by mouth.       No Known Allergies    Review of Systems    Constitutional: negative for fever, malaise/fatigue, weakness.   20 lbs weight loss since 07/2010  Eyes: negative for blurred vision and double vision  Ears, nose, mouth, throat, and face: negative for congestion and headaches  Respiratory: negative for cough, shortness of breath or sputum production  Cardiovascular: negative for chest pain and leg swelling  Gastrointestinal: negative for abdominal pain, diarrhea, nausea and vomiting  Skin: negative for itching and rash  Hematologic/lymphatic: negative for easy bruising/bleeding  Musculoskeletal:negative for joint pain and myalgias  Neurological: negative for dizziness, focal weakness and LOC      Objective:     Estimated Body mass index is 24.37 kg/(m^2) as calculated from the following:    Height as of this encounter: 5\' 9" (1.753 m).    Weight as of this encounter: 165 lb(74.844 kg).   BP 142/88   Ht 5\' 9"  (1.753 m)   Wt 165 lb (74.844 kg)   BMI 24.37 kg/m2      General appearance: alert, cooperative, no distress, appears stated age  Head: Normocephalic, without obvious abnormality, atraumatic  Chest: normal resp effort  Abdom: soft and benign  DRE: 20g firm smooth gland  Extremities: extremities normal, atraumatic, no cyanosis or edema  Skin: Skin color, texture, turgor normal. No rashes or lesions  Neuro:  AAO x 3      Results for orders placed in visit on 02/18/11   AMB POC URINALYSIS DIP STICK AUTO W/O MICRO       Component Value Range    Color Yellow  (none)     Clarity Clear  (none)     Glucose Negative  (none)  Bilirubin Negative  (none)     Ketones Negative  (none)      Spec.Grav. 1.020  1.001 - 1.035     Blood Negative  (none)     pH 6.5  4.6 - 8.0     Protein Neg  ZOX:WRUEAVWU(JW/JX)    Urobilinogen 0.2 mg/dL      Nitrites Negative  (none)     Leukocyte esterase Negative  (none)    AMB POC UROFLOWMETRY       Component Value Range    VOIDING TIME 14      FLOW TIME 10      TIME TO MAX 0      MAX FLOW RATE 1      AVG FLOW RATE 2      VOIDED VOLUME 21     AMB POC PVR, MEAS,POST-VOID RES,US,NON-IMAGING       Component Value Range    PVR 463     catheterized for 440cc urine        PSA /TESTOSTERONE - [facility] PSA   Latest Ref Rng    12/22/2010 0.92   08/13/2010 0.27   05/05/2010 0.18   01/20/2010 0.13   10/10/2009 0.03   06/19/2009    02/21/2009 6.63(A)       This note has been sent to the referring physician, with findings and recommendations.    Mathews Argyle, MD, FACS          CC:  Wynelle Link JR  [address]  [place] [address]

## 2011-02-19 LAB — METABOLIC PANEL, BASIC
BUN/Creatinine ratio: 13 (ref 10–22)
BUN: 9 mg/dL (ref 8–27)
CO2: 22 mmol/L (ref 20–32)
Calcium: 9.6 mg/dL (ref 8.6–10.2)
Chloride: 105 mmol/L (ref 97–108)
Creatinine: 0.7 mg/dL — ABNORMAL LOW (ref 0.76–1.27)
GFR est non-AA: 96 mL/min/{1.73_m2} (ref >59)
Glucose: 85 mg/dL (ref 65–99)
Potassium: 4.3 mmol/L (ref 3.5–5.2)
Sodium: 141 mmol/L (ref 134–144)
eGFR If African American: 111 mL/min/{1.73_m2} (ref >59)

## 2011-02-19 LAB — PLEASE NOTE

## 2011-02-19 LAB — PSA, DIAGNOSTIC (PROSTATE SPECIFIC AG): Prostate Specific Ag: 0.546 ng/ml (ref 0.00–4.00)

## 2011-02-20 NOTE — Progress Notes (Signed)
Addended by: Hulda Humphrey on: 02/20/2011 11:13 AM     Modules accepted: Orders

## 2011-02-24 NOTE — Op Note (Signed)
[facility]   Operation Report   NAME:  Cory Meyers, Cory Meyers   SEX:   M   DATE: 02/24/2011   DOB: August 13, 1940   MR#    [id number]   ROOM:     ACCT#  [id number]               PREOPERATIVE DIAGNOSES:   1.  History of prostate cancer status post iodine 125 seed implantation on    06/24/2009.     2.  Incomplete bladder emptying with recurrent enterococcus urinary tract    infection.       POSTOPERATIVE DIAGNOSIS:   1.  History of prostate cancer status post iodine 125 seed implantation on    06/24/2009.     2.  Incomplete bladder emptying with recurrent enterococcus urinary tract    infection.       PROCEDURE PERFORMED:   Cystourethroscopy, bilateral retrograde pyelograms and cystogram       INTRAOPERATIVE FINDINGS:   Normal retrograde pyelograms and normal cystogram.  No evidence of reflux or    fistula formation.       SURGEON:   Mathews Argyle, MD       ANESTHESIA:   General.       COMPLICATIONS:   None.       BLOOD LOSS:   Minimal.       DESCRIPTION OF PROCEDURE:   The patient was identified in the holding area and brought into the cysto    room.  He received 2 grams of ampicillin preoperatively.  After induction of    anesthesia, he was put up into lithotomy position on the cysto table.  His    lower abdomen and genitalia were prepped and draped in a sterile fashion.     After appropriate time-out scout fluoroscopy was used.  This showed previously    placed iodine seeds in the prostate.  The remainder of the urinary tract    infection was free of any abnormal densities.  I passed a 21-French cystoscope    under direct visualization into the urethra.  This was advanced all the way    into the bladder.  The patient had no evidence of any urethral stricture    disease.  He had a relatively short nonobstructive prostate.  The bladder neck    was only minimally elevated.  The prostatic urethra has some superficial    spidery veins due to his previous radiation treatment.  This appears to be     slightly friable but I carefully inspected the prostatic urethra and saw no    evidence of any exposed seeds.  The bladder was then also carefully inspected    with both the 30-degree and 70-degree lens.  It was mild to moderately    trabeculated but no urothelial lesions were seen.  Both ureteral orifices were    located in their orthotopic positions.  There appears to be a pocket or a    diverticular-like collection just posterior to the mid trigone.  This area    appears to collect with fluid and contrast when we injected contrast into the    bladder.  I first cannulated the ureteral orifices with a 5-French open-ended    catheter.  Bilateral retrograde pyelograms were performed.  These showed    normal ureters and renal collecting system with no filling defects and no    obstruction.  The contrast drained briskly from the upper tracts into the    bladder and as the  contrast drained from the bladder I could see that there    was a pocket of contrast that collected in the mid-posterior wall of the    bladder.  This corresponds to this pocket just proximal to the interureteric    ridge.  It did not appear to be true diverticulum but it did have a slightly    abnormal appearance on cystoscopy, almost like a wide mouth diverticulum in    the location of the mid posterior bladder wall.  The remainder of the bladder    was unremarkable.         After completion of the retrograde pyelogram and watching the contrast drain    from the upper tract, I then put a 16-French Foley catheter into the bladder.     I drained out all of the irrigation fluid and contrast from the bladder.  I    then performed a cystogram by filling the bladder with approximately 500 mL of    Cystografin under gravity.  The bladder capacity was reached at approximately    500 mL during the filling phase.  The bladder wall appears to be quite smooth    and there was no obvious diverticulum on the cystogram, although we were only     looking at him on an AP view.  There was no evidence of any vesicoureteral    reflux.  After the bladder capacity was reached I then slowly drained the    contrast from the bladder via the Foley catheter.  Again, there was good    drainage of contrast.  The wall of the bladder appears to be relatively    smooth.  The contrast completely drained.  There is no evidence of any    fistulization and no evidence of any vesicoureteral reflux and this pocket in    the mid posterior bladder wall easily evacuated the contrast via the Foley    catheter.  I do not believe that there is any clinical significance to this    finding on the cystoscopy.  After completion of the cystogram I removed the    Foley catheter from the bladder.  The patient was extubated and taken to the    recovery room in good condition.         The plan is to have the patient follow back up.  We will monitor his bladder    emptying.  Previously, he had been on anticholinergic medications due to some    irritative voiding symptoms.  We have stopped these medications.  The patient    is now on Flomax b.i.d. and we will continue to ensure that he is emptying    his bladder adequately.  There does not appear to be any other obvious source    for his recurrent urinary tract infections.           ___________________   Page Spiro MD   Dictated By:.    ST   D:02/24/2011   T: 02/24/2011 14:38:55   [id number]

## 2011-02-24 NOTE — Op Note (Signed)
[facility]   Operation Report   NAME:  Cory, Meyers   SEX:   M   DATE: 02/24/2011   DOB: 1940/11/22   MR#    [id number]   ROOM:     ACCT#  [id number]               PREOPERATIVE DIAGNOSES:   1.  History of prostate cancer status post iodine 125 seed implantation on    06/24/2009.     2.  Incomplete bladder emptying with recurrent enterococcus urinary tract    infection.       POSTOPERATIVE DIAGNOSIS:   1.  History of prostate cancer status post iodine 125 seed implantation on    06/24/2009.     2.  Incomplete bladder emptying with recurrent enterococcus urinary tract    infection.       PROCEDURE PERFORMED:   Cystourethroscopy, bilateral retrograde pyelograms and cystogram       INTRAOPERATIVE FINDINGS:   Normal retrograde pyelograms and normal cystogram.  No evidence of reflux or    fistula formation.       SURGEON:   Mathews Argyle, MD       ANESTHESIA:   General.       COMPLICATIONS:   None.       BLOOD LOSS:   Minimal.       DESCRIPTION OF PROCEDURE:   The patient was identified in the holding area and brought into the cysto    room.  He received 2 grams of ampicillin preoperatively.  After induction of    anesthesia, he was put up into lithotomy position on the cysto table.  His    lower abdomen and genitalia were prepped and draped in a sterile fashion.     After appropriate time-out scout fluoroscopy was used.  This showed previously    placed iodine seeds in the prostate.  The remainder of the urinary tract    infection was free of any abnormal densities.  I passed a 21-French cystoscope    under direct visualization into the urethra.  This was advanced all the way    into the bladder.  The patient had no evidence of any urethral stricture    disease.  He had a relatively short nonobstructive prostate.  The bladder neck    was only minimally elevated.  The prostatic urethra has some superficial    spidery veins due to his previous radiation treatment.  This appears to be    slightly friable but I  carefully inspected the prostatic urethra and saw no    evidence of any exposed seeds.  The bladder was then also carefully inspected    with both the 30-degree and 70-degree lens.  It was mild to moderately    trabeculated but no urothelial lesions were seen.  Both ureteral orifices were    located in their orthotopic positions.  There appears to be a pocket or a    diverticular-like collection just posterior to the mid trigone.  This area    appears to collect with fluid and contrast when we injected contrast into the    bladder.  I first cannulated the ureteral orifices with a 5-French open-ended    catheter.  Bilateral retrograde pyelograms were performed.  These showed    normal ureters and renal collecting system with no filling defects and no    obstruction.  The contrast drained briskly from the upper tracts into the    bladder and as the  contrast drained from the bladder I could see that there    was a pocket of contrast that collected in the mid-posterior wall of the    bladder.  This corresponds to this pocket just proximal to the interureteric    ridge.  It did not appear to be true diverticulum but it did have a slightly    abnormal appearance on cystoscopy, almost like a wide mouth diverticulum in    the location of the mid posterior bladder wall.  The remainder of the bladder    was unremarkable.         After completion of the retrograde pyelogram and watching the contrast drain    from the upper tract, I then put a 16-French Foley catheter into the bladder.     I drained out all of the irrigation fluid and contrast from the bladder.  I    then performed a cystogram by filling the bladder with approximately 500 mL of    Cystografin under gravity.  The bladder capacity was reached at approximately    500 mL during the filling phase.  The bladder wall appears to be quite smooth    and there was no obvious diverticulum on the cystogram, although we were only    looking at him on an AP view.  There was  no evidence of any vesicoureteral    reflux.  After the bladder capacity was reached I then slowly drained the    contrast from the bladder via the Foley catheter.  Again, there was good    drainage of contrast.  The wall of the bladder appears to be relatively    smooth.  The contrast completely drained.  There is no evidence of any    fistulization and no evidence of any vesicoureteral reflux and this pocket in    the mid posterior bladder wall easily evacuated the contrast via the Foley    catheter.  I do not believe that there is any clinical significance to this    finding on the cystoscopy.  After completion of the cystogram I removed the    Foley catheter from the bladder.  The patient was extubated and taken to the    recovery room in good condition.         The plan is to have the patient follow back up.  We will monitor his bladder    emptying.  Previously, he had been on anticholinergic medications due to some    irritative voiding symptoms.  We have stopped these medications.  The patient    is now on Flomax b.i.d. and we will continue to ensure that he is emptying    his bladder adequately.  There does not appear to be any other obvious source    for his recurrent urinary tract infections.           ___________________   Page Spiro MD   Dictated By:.    ST   D:02/24/2011   T: 02/24/2011 14:38:55   [id number]

## 2011-03-25 LAB — AMB POC URINALYSIS DIP STICK AUTO W/O MICRO
Bilirubin (UA POC): NEGATIVE
Blood (UA POC): NEGATIVE
Glucose (UA POC): NEGATIVE
Leukocyte esterase (UA POC): NEGATIVE
Nitrites (UA POC): NEGATIVE
Protein (UA POC): NEGATIVE mg/dL
Specific gravity (UA POC): 1.025 (ref 1.001–1.035)
Urobilinogen (UA POC): 0.2 (ref 0.2–1)
pH (UA POC): 5.5 (ref 4.6–8.0)

## 2011-03-25 LAB — AMB POC PVR, MEAS,POST-VOID RES,US,NON-IMAGING: PVR: 92 cc

## 2011-03-25 LAB — AMB POC UROFLOWMETRY
AVG FLOW RATE: 3 ml/Seconds
FLOW TIME: 15 Seconds
MAX FLOW RATE: 5 ml/Seconds
TIME TO MAX, POC: 5 Seconds
VOIDED VOLUME: 49 ml
VOIDING TIME: 15 Seconds

## 2011-03-25 NOTE — Progress Notes (Signed)
Assessment/Plan:             1. Malignant neoplasm of prostate  PROSTATE SPECIFIC AG (PSA)   2. Urinary frequency  AMB POC PVR, MEAS,POST-VOID RES,US,NON-IMAGING, AMB POC URINALYSIS DIP STICK AUTO W/O MICRO, AMB POC UROFLOWMETRY   3. Urinary urgency     4. Incomplete emptying of bladder  AMB POC PVR, MEAS,POST-VOID RES,US,NON-IMAGING, AMB POC URINALYSIS DIP STICK AUTO W/O MICRO, AMB POC UROFLOWMETRY   5. Recurrent UTI  AMB POC URINALYSIS DIP STICK AUTO W/O MICRO       Recurrent  enterococcus UTI's.   Negative CTU and cysto/retrogrades.   Irritative voiding symptoms but stopped anticholinergic meds due to elevated PVR's.  Continue  flomax 0.4mg  bid.  Improved bladder emptying.    S/p I 125 seeds 06/24/09.  PSA stable.  Repeat PSA in 6 months.           Subjective:   Cory Meyers is a 70 y.o. Caucasian male who is seen for follow up of CAP s/p I 125 seeds on 06/24/09.  Received LHRH 22.5mg  on 05/27/10.     H/o recurrent enterococcus UTI treated with macrobid in March and finished ampicillin yesterday for positive urine culture 10/06/10.  Negative office cysto 10/18/09 and had CT in 02/2010 which only showed right renal cyst  Documented enterococcus UTI again on 02/10/11 with dysuria treated with cipro.    Had unremarkable cysto/retrogrades on 02/24/11.  Currently on flomax to 0.4mg  bid and stopped vesicare due to documented elevated PVR 400cc on last visit.  Patient still having bothersome urinary urgency symptoms but denies dysuria or urgency incontinence.  Slight decreased FOS but no straining to void.    Past Medical History   Diagnosis Date   ??? Hypercholesteremia    ??? Hypertension    ??? COPD (chronic obstructive pulmonary disease)    ??? 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 Emphysema    ??? Recurrent UTI      negative CTU and cysto in 2011   ??? Slow urinary stream    ??? Urinary frequency    ??? Melanoma in situ of back    ??? Dysuria    ??? Weight loss, unintentional      -20lbs since February      Past Surgical History   Procedure Date   ??? Hx septoplasty    ??? Pr finger tendon transfer,4-5 fingrs    ??? Biopsy prostate 04/01/09     Gleason 6(3+3)Left mid, Dr Onalee Hua    ??? Pr surg expos,prostate,radioactiv insrt      [facility], I-125 seed implant, Dr Janalyn Rouse    ??? Hx tonsillectomy    ??? Hx other surgical 12-25-10     Melanoma removed from back   ??? Hx urological 02-24-11     [facility], Cysto-Bilat retrograde pyelograms & cystogram, Dr Verdie Mosher     No family history on file.  History     Social History   ??? Marital Status: Married     Spouse Name: N/A     Number of Children: N/A   ??? Years of Education: N/A     Occupational History   ??? Not on file.     Social History Main Topics   ??? Smoking status: Former Smoker -- 3.0 packs/day for 38 years     Quit date: 06/09/1995   ??? Smokeless tobacco: Never Used   ??? Alcohol Use:  Yes      A lttle everyday.   ??? Drug Use: No   ??? Sexually Active: Not on file     Other Topics Concern   ??? Not on file     Social History Narrative   ??? No narrative on file     Current Outpatient Prescriptions   Medication Sig   ??? tamsulosin (FLOMAX) 0.4 mg capsule Take 1 Cap by mouth two (2) times daily (after meals).   ??? simvastatin (ZOCOR) 40 mg tablet Take  by mouth nightly.     ??? losartan (COZAAR) 50 mg tablet Take  by mouth daily.     ??? tiotropium (SPIRIVA WITH HANDIHALER) 18 mcg inhalation capsule Take 1 Cap by inhalation daily.     ??? ASCORBATE CALCIUM (VITAMIN C PO) Take  by mouth.     ??? GLUC SU/CHONDRO SU A/VIT C/MN (GLUCOSAMINE CHONDROITIN MAXSTR PO) Take  by mouth.     ??? CA CITRATE/MGOX/VIT D3/B6/MIN (CALCIUM CITRATE + D WITH MAG PO) Take  by mouth.     ??? FOLIC ACID PO Take  by mouth.     ??? MULTIVITS-MINERALS/FA/LYCOPENE (ONE-A-DAY MEN'S PO) Take  by mouth.     ??? MILK THISTLE PO Take  by mouth.     ??? ciprofloxacin (CIPRO) 500 mg tablet Take  by mouth two (2) times a day.       No Known Allergies    Review of Systems     Constitutional: negative for fever, malaise/fatigue, weakness.   20 lbs weight loss since 07/2010  Eyes: negative for blurred vision and double vision  Ears, nose, mouth, throat, and face: negative for congestion and headaches  Respiratory: negative for cough, shortness of breath or sputum production  Cardiovascular: negative for chest pain and leg swelling  Gastrointestinal: negative for abdominal pain, diarrhea, nausea and vomiting  Skin: negative for itching and rash  Hematologic/lymphatic: negative for easy bruising/bleeding  Musculoskeletal:negative for joint pain and myalgias  Neurological: negative for dizziness, focal weakness and LOC      Objective:     Estimated Body mass index is 24.37 kg/(m^2) as calculated from the following:    Height as of this encounter: 5\' 9" (1.753 m).    Weight as of this encounter: 165 lb(74.844 kg).   BP 120/70   Ht 5\' 9"  (1.753 m)   Wt 165 lb (74.844 kg)   BMI 24.37 kg/m2      General appearance: alert, cooperative, no distress, appears stated age  Head: Normocephalic, without obvious abnormality, atraumatic  Chest: normal resp effort  Abdom: soft and benign  DRE: deferred  Extremities: extremities normal, atraumatic, no cyanosis or edema  Skin: Skin color, texture, turgor normal. No rashes or lesions  Neuro:  AAO x 3      Results for orders placed in visit on 03/25/11   AMB POC PVR, MEAS,POST-VOID RES,US,NON-IMAGING       Component Value Range    PVR 92     AMB POC URINALYSIS DIP STICK AUTO W/O MICRO       Component Value Range    Color Yellow  (none)    Clarity Clear  (none)    Glucose Negative  (none)    Bilirubin Negative  (none)    Ketones Trace  (none)    Spec.Grav. 1.025  1.001 - 1.035    Blood Negative  (none)    pH 5.5  4.6 - 8.0    Protein Negative  Negative mg/dL  Urobilinogen 0.2 mg/dL  0.2 - 1    Nitrites Negative  (none)    Leukocyte esterase Negative  (none)   AMB POC UROFLOWMETRY       Component Value Range    VOIDING TIME 15      FLOW TIME 15       TIME TO MAX 5      MAX FLOW RATE 5      AVG FLOW RATE 3      VOIDED VOLUME 49          PSA /TESTOSTERONE - BSHSI PSA   Latest Ref Rng 0.00 - 4.00 ng/ml   02/18/2011 0.546   12/22/2010 0.92   08/13/2010 0.27   05/05/2010 0.18   01/20/2010 0.13   10/10/2009 0.03   06/19/2009    02/21/2009 6.63(A)   10/07/2007 3.06         This note has been sent to the referring physician, with findings and recommendations.    Mathews Argyle, MD, FACS          CC:  Wynelle Link JR  [address]  [place] [address]

## 2011-03-25 NOTE — Patient Instructions (Signed)
MyChart Activation    Thank you for requesting access to MyChart. Please follow the instructions below to securely access and download your online medical record. MyChart allows you to send messages to your doctor, view your test results, renew your prescriptions, schedule appointments, and more.    How Do I Sign Up?    1. In your internet browser, go to [email or web address]  2. Click on the First Time User? Click Here link in the Sign In box. You will be redirect to the New Member Sign Up page.  3. Enter your MyChart Access Code exactly as it appears below. You will not need to use this code after you???ve completed the sign-up process. If you do not sign up before the expiration date, you must request a new code.    MyChart Access Code: Not generated  Current MyChart Status: Active (This is the date your MyChart access code will expire)    4. Enter the last four digits of your Social Security Number (xxxx) and Date of Birth (mm/dd/yyyy) as indicated and click Submit. You will be taken to the next sign-up page.  5. Create a MyChart ID. This will be your MyChart login ID and cannot be changed, so think of one that is secure and easy to remember.  6. Create a MyChart password. You can change your password at any time.  7. Enter your Password Reset Question and Answer. This can be used at a later time if you forget your password.   8. Enter your e-mail address. You will receive e-mail notification when new information is available in MyChart.  9. Click Sign Up. You can now view and download portions of your medical record.  10. Click the Download Summary menu link to download a portable copy of your medical information.    Additional Information    If you have questions, please visit the Frequently Asked Questions section of the MyChart website at [email or web address] Remember, MyChart is NOT to be used for urgent needs. For medical emergencies, dial 911.

## 2011-09-23 NOTE — Telephone Encounter (Signed)
Returning pt phone call. Pt had a question regarding a RX for his PSA. Pt was told that he can have his PSA done the day of his appt with Dr.Liu on 10-05-11

## 2011-10-05 LAB — PSA, DIAGNOSTIC (PROSTATE SPECIFIC AG): Prostate Specific Ag: 0.309 ng/mL (ref 0.00–4.00)

## 2011-10-05 LAB — AMB POC URINALYSIS DIP STICK AUTO W/O MICRO
Bilirubin (UA POC): NEGATIVE
Blood (UA POC): NEGATIVE
Glucose (UA POC): NEGATIVE
Leukocyte esterase (UA POC): NEGATIVE
Nitrites (UA POC): NEGATIVE
Protein (UA POC): NEGATIVE mg/dL
Specific gravity (UA POC): 1.015 (ref 1.001–1.035)
Urobilinogen (UA POC): 0.2 (ref 0.2–1)
pH (UA POC): 7 (ref 4.6–8.0)

## 2011-10-05 LAB — AMB POC PVR, MEAS,POST-VOID RES,US,NON-IMAGING: PVR: 39 cc

## 2011-10-05 NOTE — Progress Notes (Signed)
Assessment/Plan:             1. Malignant neoplasm of prostate  PROSTATE SPECIFIC AG (PSA), PR COLLECTION VENOUS BLOOD,VENIPUNCTURE, AMB POC URINALYSIS DIP STICK AUTO W/O MICRO   2. Urinary urgency  AMB POC URINALYSIS DIP STICK AUTO W/O MICRO, AMB POC PVR, MEAS,POST-VOID RES,US,NON-IMAGING   3. History of recurrent UTIs  AMB POC URINALYSIS DIP STICK AUTO W/O MICRO         S/p I 125 seeds 06/24/09.  PSA stable.  Repeat PSA today and f/u in 6 months.    H/o Recurrent  enterococcus UTI's.   Negative CTU and cysto/retrogrades.  Normal UA today.  Irritative voiding symptoms stable/improved  Continue  flomax 0.4mg  bid.  Good  bladder emptying today.        Subjective:   Cory Meyers is a 71 y.o. Caucasian male who is seen for follow up of CAP s/p I 125 seeds on 06/24/09.  Received LHRH 22.5mg  on 05/27/10.     H/o recurrent enterococcus UTI treated with macrobid in March and finished ampicillin for positive urine culture 10/06/10.  Negative office cysto 10/18/09 and had CT in 02/2010 which only showed right renal cyst  Documented enterococcus UTI again on 02/10/11 with dysuria treated with cipro.  Had unremarkable cysto/retrogrades on 02/24/11.      Currently on flomax to 0.4mg  bid and stopped vesicare due to documented elevated PVR 400cc.    Patient still having bothersome urinary urgency symptoms but denies dysuria or urgency incontinence.  Slight decreased FOS but no straining to void.      Past Medical History   Diagnosis Date   ??? Hypercholesteremia    ??? Hypertension    ??? COPD (chronic obstructive pulmonary disease)    ??? 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 Emphysema    ??? Recurrent UTI      negative CTU and cysto in 2011   ??? Slow urinary stream    ??? Urinary frequency    ??? Melanoma in situ of back    ??? Dysuria    ??? Weight loss, unintentional      -20lbs since February     Past Surgical History   Procedure Date   ??? Hx septoplasty    ??? Pr finger tendon transfer,4-5 fingrs    ??? Biopsy  prostate 04/01/09     Gleason 6(3+3)Left mid, Dr Onalee Hua    ??? Pr surg expos,prostate,radioactiv insrt      [facility], I-125 seed implant, Dr Janalyn Rouse    ??? Hx tonsillectomy    ??? Hx other surgical 12-25-10     Melanoma removed from back   ??? Hx urological 02-24-11     [facility], Cysto-Bilat retrograde pyelograms & cystogram, Dr Verdie Mosher     No family history on file.  History     Social History   ??? Marital Status: MARRIED     Spouse Name: N/A     Number of Children: N/A   ??? Years of Education: N/A     Occupational History   ??? Not on file.     Social History Main Topics   ??? Smoking status: Former Smoker -- 3.0 packs/day for 38 years     Quit date: 06/09/1995   ??? Smokeless tobacco: Never Used   ??? Alcohol Use: Yes      A lttle everyday.   ??? Drug Use: No   ???  Sexually Active: Not on file     Other Topics Concern   ??? Not on file     Social History Narrative   ??? No narrative on file     Current Outpatient Prescriptions   Medication Sig   ??? simvastatin (ZOCOR) 40 mg tablet Take  by mouth nightly.     ??? losartan (COZAAR) 50 mg tablet Take  by mouth daily.     ??? tiotropium (SPIRIVA WITH HANDIHALER) 18 mcg inhalation capsule Take 1 Cap by inhalation daily.     ??? ASCORBATE CALCIUM (VITAMIN C PO) Take  by mouth.     ??? GLUC SU/CHONDRO SU A/VIT C/MN (GLUCOSAMINE CHONDROITIN MAXSTR PO) Take  by mouth.     ??? CA CITRATE/MGOX/VIT D3/B6/MIN (CALCIUM CITRATE + D WITH MAG PO) Take  by mouth.     ??? FOLIC ACID PO Take  by mouth.     ??? MULTIVITS-MINERALS/FA/LYCOPENE (ONE-A-DAY MEN'S PO) Take  by mouth.     ??? MILK THISTLE PO Take  by mouth.     ??? ciprofloxacin (CIPRO) 500 mg tablet Take  by mouth two (2) times a day.     ??? tamsulosin (FLOMAX) 0.4 mg capsule Take 1 Cap by mouth two (2) times daily (after meals).     No Known Allergies    Review of Systems    Constitutional: negative for fever, malaise/fatigue, weakness.   20 lbs weight loss since 07/2010  Eyes: negative for blurred vision and double vision  Ears, nose, mouth, throat, and face: negative for  congestion and headaches  Respiratory: negative for cough, shortness of breath or sputum production  Cardiovascular: negative for chest pain and leg swelling  Gastrointestinal: negative for abdominal pain, diarrhea, nausea and vomiting  Skin: negative for itching and rash  Hematologic/lymphatic: negative for easy bruising/bleeding  Musculoskeletal:negative for joint pain and myalgias  Neurological: negative for dizziness, focal weakness and LOC      Objective:     Estimated Body mass index is 25.10 kg/(m^2) as calculated from the following:    Height as of this encounter: 5\' 9" (1.753 m).    Weight as of this encounter: 170 lb(77.111 kg).   BP 130/80   Ht 5\' 9"  (1.753 m)   Wt 170 lb (77.111 kg)   BMI 25.10 kg/m2      General appearance: alert, cooperative, no distress, appears stated age  Head: Normocephalic, without obvious abnormality, atraumatic  Chest: normal resp effort  Abdom: soft and benign  DRE: smooth firm prostate, slight asymmetry R>L  Extremities: extremities normal, atraumatic, no cyanosis or edema  Skin: Skin color, texture, turgor normal. No rashes or lesions  Neuro:  AAO x 3      Results for orders placed in visit on 10/05/11   PROSTATE SPECIFIC AG (PSA)       Component Value Range    Prostate Specific Ag 0.309  0.00 - 4.00 ng/mL   AMB POC URINALYSIS DIP STICK AUTO W/O MICRO       Component Value Range    Color Yellow  (none)    Clarity Clear  (none)    Glucose Negative  (none)    Bilirubin Negative  (none)    Ketones    (none)    Spec.Grav. 1.015  1.001 - 1.035    Blood Negative  (none)    pH 7.0  4.6 - 8.0    Protein, Urine Negative  Negative mg/dL    Urobilinogen 0.2 mg/dL  0.2 - 1  Nitrites Negative  (none)    Leukocyte esterase Negative  (none)   AMB POC PVR, MEAS,POST-VOID RES,US,NON-IMAGING       Component Value Range    PVR 39 mls          PSA /TESTOSTERONE - BSHSI PSA   Latest Ref Rng 0.00 - 4.00 ng/ml   02/18/2011 0.546   12/22/2010 0.92   08/13/2010 0.27   05/05/2010 0.18   01/20/2010 0.13    10/10/2009 0.03   06/19/2009    02/21/2009 6.63(A)   10/07/2007 3.06         This note has been sent to the referring physician, with findings and recommendations.    Mathews Argyle, MD, FACS          CC:  Andee Lineman, MD  [address] [place] [address]

## 2011-10-05 NOTE — Progress Notes (Signed)
Quick Note:      Mail psa result to patient    ______

## 2011-10-05 NOTE — Progress Notes (Signed)
Same day PSA obtained via venipuncture without difficulty per Dr. Liu's order. Patient to receive results today in office.

## 2012-03-09 LAB — AMB POC URINALYSIS DIP STICK AUTO W/O MICRO
Bilirubin (UA POC): NEGATIVE
Blood (UA POC): NEGATIVE
Glucose (UA POC): NEGATIVE
Leukocyte esterase (UA POC): NEGATIVE
Nitrites (UA POC): NEGATIVE
Specific gravity (UA POC): 1.025 (ref 1.001–1.035)
Urobilinogen (UA POC): 4 (ref 0.2–1)
pH (UA POC): 7 (ref 4.6–8.0)

## 2012-03-09 LAB — AMB POC PVR, MEAS,POST-VOID RES,US,NON-IMAGING: PVR: 21 cc

## 2012-03-09 LAB — PSA, DIAGNOSTIC (PROSTATE SPECIFIC AG): Prostate Specific Ag: 0.13 ng/mL (ref 0.00–4.00)

## 2012-03-09 NOTE — Progress Notes (Signed)
Assessment/Plan:             1. Malignant neoplasm of prostate  AMB POC URINALYSIS DIP STICK AUTO W/O MICRO, PROSTATE SPECIFIC AG (PSA), PR COLLECTION VENOUS BLOOD,VENIPUNCTURE   2. Hx: UTI (urinary tract infection)  AMB POC URINALYSIS DIP STICK AUTO W/O MICRO   3. Nocturia  AMB POC URINALYSIS DIP STICK AUTO W/O MICRO, AMB POC PVR, MEAS,POST-VOID RES,US,NON-IMAGING         S/p I 125 seeds 06/24/09.  Low PSA's stable.  Repeat PSA today and f/u in 6 months.    H/o Recurrent  enterococcus UTI's.   Negative CTU and cysto/retrogrades.  Normal UA today.  Irritative voiding symptoms stable/improved.  Good  bladder emptying today.  Can reduce flomax to daily.        Subjective:   Cory Meyers is a 71 y.o. Caucasian male who is seen for follow up of CAP s/p I 125 seeds on 06/24/09.  Received LHRH 22.5mg  on 05/27/10.     H/o recurrent enterococcus UTI treated with macrobid in March and finished ampicillin for positive urine culture 10/06/10.  Negative office cysto 10/18/09 and had CT in 02/2010 which only showed right renal cyst  Documented enterococcus UTI again on 02/10/11 with dysuria treated with cipro.  Had unremarkable cysto/retrogrades on 02/24/11.      Currently on flomax to 0.4mg  bid and stopped vesicare due to documented elevated PVR 400cc.    Patient now without bothersome daytime frequency/ugency, nocturia x 0-1.  Denies bothersome hesitancy or slow stream.    Past Medical History   Diagnosis Date   ??? Hypercholesteremia    ??? Hypertension    ??? COPD (chronic obstructive pulmonary disease)    ??? 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 Emphysema    ??? Recurrent UTI      negative CTU and cysto in 2011   ??? Slow urinary stream    ??? Urinary frequency    ??? Melanoma in situ of back    ??? Dysuria    ??? Weight loss, unintentional      -20lbs since February     Past Surgical History   Procedure Date   ??? Hx septoplasty    ??? Pr finger tendon transfer,4-5 fingrs    ??? Biopsy prostate 04/01/09     Gleason  6(3+3)Left mid, Dr Onalee Hua    ??? Pr surg expos,prostate,radioactiv insrt      [facility], I-125 seed implant, Dr Janalyn Rouse    ??? Hx tonsillectomy    ??? Hx other surgical 12-25-10     Melanoma removed from back   ??? Hx urological 02-24-11     [facility], Cysto-Bilat retrograde pyelograms & cystogram, Dr Verdie Mosher     History reviewed. No pertinent family history.  History     Social History   ??? Marital Status: MARRIED     Spouse Name: N/A     Number of Children: N/A   ??? Years of Education: N/A     Occupational History   ??? Not on file.     Social History Main Topics   ??? Smoking status: Former Smoker -- 3.0 packs/day for 38 years     Quit date: 06/09/1995   ??? Smokeless tobacco: Never Used   ??? Alcohol Use: Yes      A lttle everyday.   ??? Drug Use: No   ??? Sexually Active: Not on  file     Other Topics Concern   ??? Not on file     Social History Narrative   ??? No narrative on file     Current Outpatient Prescriptions   Medication Sig   ??? tamsulosin (FLOMAX) 0.4 mg capsule Take 1 Cap by mouth two (2) times daily (after meals).   ??? simvastatin (ZOCOR) 40 mg tablet Take  by mouth nightly.     ??? losartan (COZAAR) 50 mg tablet Take  by mouth daily.     ??? tiotropium (SPIRIVA WITH HANDIHALER) 18 mcg inhalation capsule Take 1 Cap by inhalation daily.     ??? ASCORBATE CALCIUM (VITAMIN C PO) Take  by mouth.     ??? GLUC SU/CHONDRO SU A/VIT C/MN (GLUCOSAMINE CHONDROITIN MAXSTR PO) Take  by mouth.     ??? CA CITRATE/MGOX/VIT D3/B6/MIN (CALCIUM CITRATE + D WITH MAG PO) Take  by mouth.     ??? FOLIC ACID PO Take  by mouth.     ??? MULTIVITS-MINERALS/FA/LYCOPENE (ONE-A-DAY MEN'S PO) Take  by mouth.     ??? MILK THISTLE PO Take  by mouth.       No Known Allergies    Review of Systems    Constitutional: negative for fever, malaise/fatigue, weakness.   20 lbs weight loss since 07/2010  Eyes: negative for blurred vision and double vision  Ears, nose, mouth, throat, and face: negative for congestion and headaches  Respiratory: negative for cough, shortness of breath or sputum  production  Cardiovascular: negative for chest pain and leg swelling  Gastrointestinal: negative for abdominal pain, diarrhea, nausea and vomiting  Skin: negative for itching and rash  Hematologic/lymphatic: negative for easy bruising/bleeding  Musculoskeletal:negative for joint pain and myalgias  Neurological: negative for dizziness, focal weakness and LOC      Objective:     Estimated Body mass index is 25.10 kg/(m^2) as calculated from the following:    Height as of this encounter: 5\' 9" (1.753 m).    Weight as of this encounter: 170 lb(77.111 kg).   BP 172/90   Ht 5\' 9"  (1.753 m)   Wt 170 lb (77.111 kg)   BMI 25.10 kg/m2  General appearance: alert, cooperative, no distress, appears stated age  Head: Normocephalic, without obvious abnormality, atraumatic  Neck: supple  Chest: normal resp effort  Abdom: soft and benign  DRE: smooth firm prostate  Extremities: extremities normal, atraumatic, no cyanosis or edema  Skin: Skin color, texture, turgor normal. No rashes or lesions  Neuro:  AAO x 3    LABS:  Results for orders placed in visit on 03/09/12   AMB POC URINALYSIS DIP STICK AUTO W/O MICRO       Component Value Range    Color (UA POC) Yellow  (none)    Clarity (UA POC) Clear  (none)    Glucose (UA POC) Negative  (none)    Bilirubin (UA POC) Negative  (none)    Ketones (UA POC) 1+  (none)    Specific gravity (UA POC) 1.025  1.001 - 1.035    Blood (UA POC) Negative  (none)    pH (UA POC) 7.0  4.6 - 8.0    Protein (UA POC) Trace  Negative mg/dL    Urobilinogen (UA POC) 4 mg/dL  0.2 - 1    Nitrites (UA POC) Negative  (none)    Leukocyte esterase (UA POC) Negative  (none)   AMB POC PVR, MEAS,POST-VOID RES,US,NON-IMAGING       Component Value Range  PVR 21          PSA /TESTOSTERONE - BSHSI PSA   Latest Ref Rng 0.00 - 4.00 ng/mL   10/05/2011 0.309   02/18/2011 0.546   12/22/2010 0.92   08/13/2010 0.27   05/05/2010 0.18   01/20/2010 0.13   10/10/2009 0.03   06/19/2009    02/21/2009 6.63(A)         This note has been sent to the  referring physician, with findings and recommendations.    Mathews Argyle, MD, FACS          CC:  Andee Lineman, MD  [address] [place] [address]

## 2012-03-09 NOTE — Progress Notes (Signed)
Quick Note:      Mail PSA result to patient    ______

## 2012-03-09 NOTE — Telephone Encounter (Signed)
Called patient to inform PSA of 0.13 today.

## 2012-03-09 NOTE — Progress Notes (Signed)
Same day PSA obtained via venipuncture without difficulty per Dr. Liu's order. Patient to receive results today in office.

## 2012-03-10 NOTE — Progress Notes (Signed)
Quick Note:    Mailed result letter to patient kh     ______

## 2012-05-09 ENCOUNTER — Encounter

## 2012-05-09 NOTE — Telephone Encounter (Signed)
Pt called in regards to needing a Rx refill for flomax. Per Dr. Theodora Blow last note Rx sent to pharmacy.

## 2012-09-15 LAB — AMB POC URINALYSIS DIP STICK AUTO W/O MICRO
Bilirubin (UA POC): NEGATIVE
Blood (UA POC): NEGATIVE
Glucose (UA POC): NEGATIVE
Ketones (UA POC): NEGATIVE
Leukocyte esterase (UA POC): NEGATIVE
Nitrites (UA POC): NEGATIVE
Protein (UA POC): NEGATIVE mg/dL
Specific gravity (UA POC): 1.01 (ref 1.001–1.035)
Urobilinogen (UA POC): 0.2 (ref 0.2–1)
pH (UA POC): 7 (ref 4.6–8.0)

## 2012-09-15 NOTE — Progress Notes (Signed)
Same day PSA obtained via venipuncture without difficulty per Dr. Liu's order. Patient to receive results today in office.

## 2012-09-15 NOTE — Progress Notes (Signed)
Assessment/Plan:               ICD-9-CM    1. Lower urinary tract symptoms (LUTS) 788.99    2. Malignant neoplasm of prostate 185 PROSTATE SPECIFIC AG (PSA)     AMB POC URINALYSIS DIP STICK AUTO W/O MICRO   3. Post-void dribbling 788.35    4. Personal history of malignant neoplasm of prostate V10.46    5. History of recurrent UTIs V13.02          S/p I 125 seeds 06/24/09.  Doing great with nondetectable PSA.    Repeat PSA today and f/u in 6 months.    H/o Recurrent  enterococcus UTI's.   Negative CTU and cysto/retrogrades.  Normal UA today.  Irritative voiding symptoms stable/improved.  Continue flomax daily.        Subjective:   Cory Meyers is a 72 y.o. Caucasian male who is seen for follow up of CAP s/p I 125 seeds on 06/24/09.  Received LHRH 22.5mg  on 05/27/10.     H/o recurrent enterococcus UTI treated with macrobid in March and finished ampicillin for positive urine culture 10/06/10.  Negative office cysto 10/18/09 and had CT in 02/2010 which only showed right renal cyst.  Documented enterococcus UTI again on 02/10/11 with dysuria treated with cipro.  Had unremarkable cysto/retrogrades on 02/24/11.      Currently on flomax to 0.4mg  daily.  Denies  bothersome daytime frequency/ugency, nocturia x 0-1.  Denies bothersome hesitancy or slow stream.  Some postvoid dribbling.    Past Medical History   Diagnosis Date   ??? Hypercholesteremia    ??? Hypertension    ??? COPD (chronic obstructive pulmonary disease)    ??? 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 Emphysema    ??? Recurrent UTI      negative CTU and cysto in 2011   ??? Slow urinary stream    ??? Urinary frequency    ??? Melanoma in situ of back    ??? Dysuria    ??? Weight loss, unintentional      -20lbs since February     Past Surgical History   Procedure Laterality Date   ??? Hx septoplasty     ??? Pr finger tendon transfer,4-5 fingrs     ??? Biopsy prostate  04/01/09     Gleason 6(3+3)Left mid, Dr Onalee Hua    ??? Pr surg expos,prostate,radioactiv insrt        [facility], I-125 seed implant, Dr Janalyn Rouse    ??? Hx tonsillectomy     ??? Hx other surgical  12-25-10     Melanoma removed from back   ??? Hx urological  02-24-11     [facility], Cysto-Bilat retrograde pyelograms & cystogram, Dr Verdie Mosher     No family history on file.  History     Social History   ??? Marital Status: MARRIED     Spouse Name: N/A     Number of Children: N/A   ??? Years of Education: N/A     Occupational History   ??? Not on file.     Social History Main Topics   ??? Smoking status: Former Smoker -- 3.00 packs/day for 38 years     Quit date: 06/09/1995   ??? Smokeless tobacco: Never Used   ??? Alcohol Use: Yes      Comment: A lttle everyday.   ??? Drug Use: No   ???  Sexually Active: Not on file     Other Topics Concern   ??? Not on file     Social History Narrative   ??? No narrative on file     Current Outpatient Prescriptions   Medication Sig   ??? triamterene-hydrochlorothiazide (DYAZIDE) 37.5-25 mg per capsule Take  by mouth daily.   ??? tamsulosin (FLOMAX) 0.4 mg capsule Take 1 Cap by mouth two (2) times daily (after meals).   ??? simvastatin (ZOCOR) 40 mg tablet Take  by mouth nightly.     ??? losartan (COZAAR) 50 mg tablet Take  by mouth daily.     ??? tiotropium (SPIRIVA WITH HANDIHALER) 18 mcg inhalation capsule Take 1 Cap by inhalation daily.     ??? ASCORBATE CALCIUM (VITAMIN C PO) Take  by mouth.     ??? GLUC SU/CHONDRO SU A/VIT C/MN (GLUCOSAMINE CHONDROITIN MAXSTR PO) Take  by mouth.     ??? CA CITRATE/MGOX/VIT D3/B6/MIN (CALCIUM CITRATE + D WITH MAG PO) Take  by mouth.     ??? FOLIC ACID PO Take  by mouth.     ??? MULTIVITS-MINERALS/FA/LYCOPENE (ONE-A-DAY MEN'S PO) Take  by mouth.     ??? MILK THISTLE PO Take  by mouth.       No current facility-administered medications for this visit.     No Known Allergies    Review of Systems    Constitutional: negative for fever, malaise/fatigue, weakness.   20 lbs weight loss since 07/2010  Eyes: negative for blurred vision and double vision  Ears, nose, mouth, throat, and face: negative for congestion and  headaches  Respiratory: negative for cough, shortness of breath or sputum production  Cardiovascular: negative for chest pain and leg swelling  Gastrointestinal: negative for abdominal pain, diarrhea, nausea and vomiting  Skin: negative for itching and rash  Hematologic/lymphatic: negative for easy bruising/bleeding  Musculoskeletal:negative for joint pain and myalgias  Neurological: negative for dizziness, focal weakness and LOC      Objective:     Estimated body mass index is 24.36 kg/(m^2) as calculated from the following:    Height as of this encounter: 5\' 9"  (1.753 m).    Weight as of this encounter: 165 lb (74.844 kg).   BP 108/64   Ht 5\' 9"  (1.753 m)   Wt 165 lb (74.844 kg)   BMI 24.36 kg/m2  General appearance: alert, cooperative, no distress, appears stated age  Head: Normocephalic, without obvious abnormality, atraumatic  Neck: supple, trachea midline  Chest: normal resp effort  Abdom: soft and nondistended, benign  DRE: smooth firm prostate  Extremities: extremities normal, atraumatic, no cyanosis or edema  Skin: Skin color, texture, turgor normal. No rashes or lesions  Neuro:  AAO x 3    LABS:  Results for orders placed in visit on 09/15/12   AMB POC URINALYSIS DIP STICK AUTO W/O MICRO       Result Value Range    Color (UA POC) Yellow      Clarity (UA POC) Clear      Glucose (UA POC) Negative  Negative    Bilirubin (UA POC) Negative  Negative    Ketones (UA POC) Negative  Negative    Specific gravity (UA POC) 1.010  1.001 - 1.035    Blood (UA POC) Negative  Negative    pH (UA POC) 7.0  4.6 - 8.0    Protein (UA POC) Negative  Negative mg/dL    Urobilinogen (UA POC) 0.2 mg/dL  0.2 - 1  Nitrites (UA POC) Negative  Negative    Leukocyte esterase (UA POC) Negative  Negative        09/15/12  PSA 0.09  PSA /TESTOSTERONE - BSHSI PSA   Latest Ref Rng 0.00 - 4.00 ng/mL   03/09/2012 0.13   10/05/2011 0.309   02/18/2011 0.546   12/22/2010 0.92   08/13/2010 0.27   05/05/2010 0.18   01/20/2010 0.13   10/10/2009 0.03    06/19/2009    02/21/2009 6.63(A)       This note has been sent to the referring physician, with findings and recommendations.    Mathews Argyle, MD, FACS          CC:  Andee Lineman, MD  [address] [place] [address]

## 2012-09-16 LAB — PSA, DIAGNOSTIC (PROSTATE SPECIFIC AG): Prostate Specific Ag: 0.09 ng/mL (ref 0.00–4.00)

## 2013-04-05 LAB — AMB POC URINALYSIS DIP STICK AUTO W/O MICRO
Bilirubin (UA POC): NEGATIVE
Blood (UA POC): NEGATIVE
Glucose (UA POC): NEGATIVE
Ketones (UA POC): NEGATIVE
Nitrites (UA POC): NEGATIVE
Protein (UA POC): NEGATIVE mg/dL
Specific gravity (UA POC): 1.015 (ref 1.001–1.035)
Urobilinogen (UA POC): 1 (ref 0.2–1)
pH (UA POC): 7 (ref 4.6–8.0)

## 2013-04-05 NOTE — Progress Notes (Signed)
AUA Symptom Score Index Questionnaire Results  1. Incomplete Emptying        0  2. Frequency                         1  3. Intermittency                     0  4. Urgency                            1    5. Weak Stream                   5  6. Straining                            0  7. Nocturia                            1    Total Score                          8      Quality of Life                         2

## 2013-04-05 NOTE — Progress Notes (Signed)
Same day PSA obtained via venipuncture without difficulty per Dr. Liu's order. Patient to receive results today in office.

## 2013-04-05 NOTE — Progress Notes (Signed)
Assessment/Plan:               ICD-9-CM    1. Malignant neoplasm of prostate 185 PROSTATE SPECIFIC AG (PSA)     AMB POC URINALYSIS DIP STICK AUTO W/O MICRO   2. 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  Doing great with nondetectable PSA.    F/u in 12 months Repeat PSA     H/o Recurrent  enterococcus UTI's.   Negative CTU and cysto/retrogrades.  Asymptomatic urine leuk today.  Check urine culture.    Decreased intensity of orgasm.  Will check serum TST.  Explained to patient that lack of semen production related to use of flomax and radiation of prostate.      Subjective:   Cory Meyers is a 72 y.o. Caucasian male who is seen for follow up of CAP s/p I 125 seeds on 06/24/09.  Received LHRH 22.5mg  on 05/27/10.     H/o recurrent enterococcus UTI treated with macrobid in March and finished ampicillin for positive urine culture 10/06/10.  Negative office cysto 10/18/09 and had CT in 02/2010 which only showed right renal cyst.  Documented enterococcus UTI again on 02/10/11 with dysuria treated with cipro.  Had unremarkable cysto/retrogrades on 02/24/11.      Currently on flomax to 0.4mg  daily.  Denies bothersome daytime frequency/ugency, nocturia x 1.  Still c/o slow stream with some postvoid dribbling.    C/o lack of semen production for several years but intensity of orgasm diminished over past 2-3 months.  Normal libido.    Past Medical History   Diagnosis Date   ??? Hypercholesteremia    ??? Hypertension    ??? COPD (chronic obstructive pulmonary disease)    ??? 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 Emphysema    ??? Recurrent UTI      negative CTU and cysto in 2011   ??? Slow urinary stream    ??? Urinary frequency    ??? Melanoma in situ of back    ??? Dysuria    ??? Weight loss, unintentional      -20lbs since February     Past Surgical History   Procedure  Laterality Date   ??? Hx septoplasty     ??? Pr finger tendon transfer,4-5 fingrs     ??? Biopsy prostate  04/01/09     Gleason 6(3+3)Left mid, Dr Onalee Hua    ??? Pr surg expos,prostate,radioactiv insrt       [facility], I-125 seed implant, Dr Janalyn Rouse    ??? Hx tonsillectomy     ??? Hx other surgical  12-25-10     Melanoma removed from back   ??? Hx urological  02-24-11     [facility], Cysto-Bilat retrograde pyelograms & cystogram, Dr Verdie Mosher     History reviewed. No pertinent family history.  History     Social History   ??? Marital Status: MARRIED     Spouse Name: N/A     Number of Children: N/A   ??? Years of Education: N/A     Occupational History   ??? Not on file.     Social History Main Topics   ??? Smoking status: Former Smoker -- 3.00 packs/day for 38 years  Quit date: 06/09/1995   ??? Smokeless tobacco: Never Used   ??? Alcohol Use: Yes      Comment: A lttle everyday.   ??? Drug Use: No   ??? Sexually Active: Not on file     Other Topics Concern   ??? Not on file     Social History Narrative   ??? No narrative on file     Current Outpatient Prescriptions   Medication Sig   ??? losartan-hydrochlorothiazide (HYZAAR) 100-25 mg per tablet Take 1 tablet by mouth daily.   ??? tiotropium (SPIRIVA WITH HANDIHALER) 18 mcg inhalation capsule Take 1 capsule by inhalation daily.   ??? tamsulosin (FLOMAX) 0.4 mg capsule Take 1 Cap by mouth two (2) times daily (after meals).   ??? simvastatin (ZOCOR) 40 mg tablet Take  by mouth nightly.     ??? ASCORBATE CALCIUM (VITAMIN C PO) Take  by mouth.     ??? GLUC SU/CHONDRO SU A/VIT C/MN (GLUCOSAMINE CHONDROITIN MAXSTR PO) Take  by mouth.     ??? CA CITRATE/MGOX/VIT D3/B6/MIN (CALCIUM CITRATE + D WITH MAG PO) Take  by mouth.     ??? FOLIC ACID PO Take  by mouth.     ??? MULTIVITS-MINERALS/FA/LYCOPENE (ONE-A-DAY MEN'S PO) Take  by mouth.     ??? MILK THISTLE PO Take  by mouth.       No current facility-administered medications for this visit.     No Known Allergies    Review of Systems  Constitutional: Fever: No  Skin: Rash: No  HEENT: Hearing  difficulty: Yes  Eyes: Blurred vision: No  Cardiovascular: Chest pain: No  Respiratory: Shortness of breath: Yes  Gastrointestinal: Nausea/vomiting: No  Musculoskeletal: Back pain: No  Neurological: Weakness: No  Psychological: Memory loss: No  Comments/additional findings:       Objective:     Estimated body mass index is 24.36 kg/(m^2) as calculated from the following:    Height as of this encounter: 5\' 9"  (1.753 m).    Weight as of this encounter: 165 lb (74.844 kg).   BP 110/80   Ht 5\' 9"  (1.753 m)   Wt 165 lb (74.844 kg)   BMI 24.36 kg/m2  General appearance: alert, cooperative, no distress, appears stated age  Head: Normocephalic, without obvious abnormality, atraumatic  Neck: supple, trachea midline  Chest: normal resp effort  Abdom: soft and nondistended, benign  DRE: smooth firm prostate  Extremities: extremities normal, atraumatic, no cyanosis or edema  Skin: Skin color, texture, turgor normal. No rashes or lesions  Neuro:  AAO x 3    LABS:  Results for orders placed in visit on 04/05/13   AMB POC URINALYSIS DIP STICK AUTO W/O MICRO       Result Value Range    Color (UA POC) Yellow      Clarity (UA POC) Clear      Glucose (UA POC) Negative  Negative    Bilirubin (UA POC) Negative  Negative    Ketones (UA POC) Negative  Negative    Specific gravity (UA POC) 1.015  1.001 - 1.035    Blood (UA POC) Negative  Negative    pH (UA POC) 7.0  4.6 - 8.0    Protein (UA POC) Negative  Negative mg/dL    Urobilinogen (UA POC) 1 mg/dL  0.2 - 1    Nitrites (UA POC) Negative  Negative    Leukocyte esterase (UA POC) Trace  Negative        04/05/13  PSA  0.05  PSA /TESTOSTERONE - BSHSI PSA   Latest Ref Rng 0.00 - 4.00 ng/mL   09/15/2012 0.09   03/09/2012 0.13   10/05/2011 0.309   02/18/2011 0.546   12/22/2010 0.92   08/13/2010 0.27   05/05/2010 0.18   01/20/2010 0.13   10/10/2009 0.03   06/19/2009          This note has been sent to the referring physician, with findings and recommendations.    Mathews Argyle, MD, FACS          CC:  Andee Lineman, MD  [address] [place] [address]

## 2013-04-06 LAB — PSA, DIAGNOSTIC (PROSTATE SPECIFIC AG): Prostate Specific Ag: 0.05 ng/mL (ref 0.00–4.00)

## 2013-04-06 LAB — TESTOSTERONE, TOTAL, ADULT MALE: Testosterone, Serum (Total): 194 ng/dL — AB (ref 348–1197)

## 2013-04-07 NOTE — Telephone Encounter (Signed)
Called Pt Cory Meyers ( ok per pt to leave on VM) to inform him of UTI and ABX Augmentin BID for 7 days has been called in. Pt to pick up today and take has directed with plenty of fluid and a snack or meal.  Chevis Weisensel L. Burnard Enis, LPN

## 2013-04-10 ENCOUNTER — Telehealth

## 2013-04-10 NOTE — Addendum Note (Signed)
Addended by: Velia Meyer L on: 04/10/2013 10:14 AM     Modules accepted: Orders

## 2013-04-10 NOTE — Telephone Encounter (Signed)
Pt called back Rx was sent to wrong Pharmacy

## 2013-04-10 NOTE — Telephone Encounter (Signed)
Enterococcus UTI, treat with macrobid x 7 days.  Rx faxed to pharmacy of choice, please notify patient to pick up and take as directed

## 2013-06-21 ENCOUNTER — Encounter

## 2013-06-21 NOTE — Telephone Encounter (Signed)
Patient phoned the office from [phone] (home)  stating that he would like a refill on his tamsulosin 0.4 mg #90 sig:one tab daily after meals, I agreed and the RX was done with 3 refills and [facility]

## 2013-06-21 NOTE — Telephone Encounter (Signed)
Pt will call back and try to get a hold of a nurse instead

## 2014-03-08 ENCOUNTER — Ambulatory Visit: Admit: 2014-03-08 | Discharge: 2014-03-08 | Payer: MEDICARE | Attending: Urology | Primary: Family Medicine

## 2014-03-08 DIAGNOSIS — C61 Malignant neoplasm of prostate: Secondary | ICD-10-CM

## 2014-03-08 LAB — AMB POC URINALYSIS DIP STICK AUTO W/O MICRO
Bilirubin (UA POC): NEGATIVE
Glucose (UA POC): NEGATIVE
Ketones (UA POC): NEGATIVE
Leukocyte esterase (UA POC): NEGATIVE
Nitrites (UA POC): NEGATIVE
Protein (UA POC): NEGATIVE mg/dL
Specific gravity (UA POC): 1.02 (ref 1.001–1.035)
Urobilinogen (UA POC): 1 (ref 0.2–1)
pH (UA POC): 7 (ref 4.6–8.0)

## 2014-03-08 LAB — PROSTATE SPECIFIC ANTIGEN, TOTAL (PSA): Prostate Specific Ag: 0.06 ng/mL (ref 0.00–4.00)

## 2014-03-08 MED ORDER — TAMSULOSIN SR 0.4 MG 24 HR CAP
0.4 mg | ORAL_CAPSULE | Freq: Every day | ORAL | Status: AC
Start: 2014-03-08 — End: ?

## 2014-03-08 NOTE — Progress Notes (Signed)
Cory Meyers presents today for lab draw per Dr. Keturah Shavers order.    Patient will be notified by the physician with lab results.    PSA obtained via venipuncture without any difficulty.      Orders Placed This Encounter   ??? PROSTATE SPECIFIC ANTIGEN, TOTAL (PSA)   ??? PR COLLECTION VENOUS BLOOD,VENIPUNCTURE       Cory Meyers on 03/08/2014

## 2014-03-08 NOTE — Progress Notes (Signed)
Assessment/Plan:               ICD-10-CM ICD-9-CM    1. Malignant neoplasm of prostate (HCC) C61 185 PROSTATE SPECIFIC ANTIGEN, TOTAL (PSA)      PR COLLECTION VENOUS BLOOD,VENIPUNCTURE      AMB POC URINALYSIS DIP STICK AUTO W/O MICRO   2. Lower urinary tract symptoms (LUTS) R39.9 788.99 AMB POC URINALYSIS DIP STICK AUTO W/O MICRO      tamsulosin (FLOMAX) 0.4 mg capsule   3. Urinary urgency R39.15 788.63 AMB POC URINALYSIS DIP STICK AUTO W/O MICRO      tamsulosin (FLOMAX) 0.4 mg capsule   4. Microscopic hematuria R31.2 599.72 AMB POC URINALYSIS DIP STICK AUTO W/O MICRO         S/p I 125 seeds 06/24/09.  Doing well with nondetectable PSA.   PSA 0.06 03/08/14.    Pt moving to [place], [place].  Will get f/u with local urologist to continue yearly PSA surveillance.    Mild LUTS, stable on flomax daily.  Rx refilled.    Asymptomatic microhematuria.  Prior H/o recurrent enterococcus UTI's.   Negative CTU and cysto/retrogrades 02/2011.  Check urine culture today.       Decreased intensity of orgasm.  TST of 194 on 04/05/13.  Explained to patient that lack of semen production related to use of flomax and radiation of prostate.      Subjective:   Cory Meyers is a 73 y.o. Caucasian male who is seen for follow up of CAP s/p I 125 seeds on 06/24/09.  Received LHRH 22.5mg  on 05/27/10.     H/o recurrent enterococcus UTI treated with macrobid in March and finished ampicillin for positive urine culture 10/06/10.  Negative office cysto 10/18/09 and had CT in 02/2010 which only showed right renal cyst.  Documented enterococcus UTI again on 02/10/11 with dysuria treated with cipro.  Had unremarkable cysto/retrogrades on 02/24/11.  Pt is without sxs at todays visit.     Currently on flomax to 0.4mg  daily.  Denies bothersome daytime frequency with mild urgency, not bothersome. Nocturia x 1.  Still c/o slow stream with some postvoid dribbling.    C/o lack of semen production for several years but intensity of orgasm  diminished over past 2-3 months.  Normal libido. TST of 194 on 04/05/13.     Past Medical History   Diagnosis Date   ??? Hypercholesteremia    ??? Hypertension    ??? COPD (chronic obstructive pulmonary disease) ([place])    ??? Malignant neoplasm of prostate ([place]) 04/01/09             Gleason 6(3+3) T1c    ??? Emphysema    ??? Recurrent UTI      negative CTU and cysto in 2011   ??? Slow urinary stream    ??? Urinary frequency    ??? Melanoma in situ of back ([place])    ??? Dysuria    ??? Weight loss, unintentional      -20lbs since February     Past Surgical History   Procedure Laterality Date   ??? Hx septoplasty     ??? Pr finger tendon transfer,4-5 fingrs     ??? Biopsy prostate  04/01/09     Gleason 6(3+3)Left mid, Dr Philbert Riser    ??? Pr surg expos,prostate,radioactiv insrt       [place], I-125 seed implant, Dr Wynona Luna    ??? Hx tonsillectomy     ??? Hx other surgical  12-25-10  Melanoma removed from back   ??? Hx urological  02-24-11     [facility], Cysto-Bilat retrograde pyelograms & cystogram, Dr Oleta Mouse     Family History   Problem Relation Age of Onset   ??? Family history unknown: Yes     History     Social History   ??? Marital Status: MARRIED     Spouse Name: N/A     Number of Children: N/A   ??? Years of Education: N/A     Occupational History   ??? Not on file.     Social History Main Topics   ??? Smoking status: Former Smoker -- 3.00 packs/day for 38 years     Quit date: 06/09/1995   ??? Smokeless tobacco: Never Used   ??? Alcohol Use: Yes      Comment: A lttle everyday.   ??? Drug Use: No   ??? Sexual Activity: Not on file     Other Topics Concern   ??? Not on file     Social History Narrative     Current Outpatient Prescriptions   Medication Sig   ??? losartan-hydrochlorothiazide (HYZAAR) 100-12.5 mg per tablet    ??? pravastatin (PRAVACHOL) 40 mg tablet    ??? tamsulosin (FLOMAX) 0.4 mg capsule Take 1 Cap by mouth daily (after dinner).   ??? tiotropium (SPIRIVA WITH HANDIHALER) 18 mcg inhalation capsule Take 1 capsule by inhalation daily.    ??? simvastatin (ZOCOR) 40 mg tablet Take  by mouth nightly.     ??? ASCORBATE CALCIUM (VITAMIN C PO) Take  by mouth.     ??? GLUC SU/CHONDRO SU A/VIT C/MN (GLUCOSAMINE CHONDROITIN MAXSTR PO) Take  by mouth.     ??? CA CITRATE/MGOX/VIT D3/B6/MIN (CALCIUM CITRATE + D WITH MAG PO) Take  by mouth.     ??? FOLIC ACID PO Take  by mouth.     ??? MULTIVITS-MINERALS/FA/LYCOPENE (ONE-A-DAY MEN'S PO) Take  by mouth.     ??? MILK THISTLE PO Take  by mouth.       No current facility-administered medications for this visit.     No Known Allergies    Review of Systems  Constitutional: Fever: No;  Skin: Rash: No;  HEENT: Hearing difficulty: Yes;  Eyes: Blurred vision: No;  Cardiovascular: Chest pain: No;  Respiratory: Shortness of breath: No;  Gastrointestinal: Nausea/vomiting: No;  Musculoskeletal: Back pain: No;  Neurological: Weakness: No;  Psychological: Memory loss: Yes;  Comments/additional findings:       Objective:     Estimated body mass index is 24.36 kg/(m^2) as calculated from the following:    Height as of this encounter: 5\' 9"  (1.753 m).    Weight as of this encounter: 165 lb (74.844 kg).   BP 144/96 mmHg   Ht 5\' 9"  (1.753 m)   Wt 165 lb (74.844 kg)   BMI 24.36 kg/m2  General appearance: alert, cooperative, no distress, appears stated age  Head: Normocephalic, without obvious abnormality, atraumatic  Neck: supple, trachea midline  Chest: normal resp effort  Abdom: soft and nondistended, benign  DRE: smooth firm prostate  Extremities: extremities normal, atraumatic, no cyanosis or edema  Skin: Skin color, texture, turgor normal. No rashes or lesions  Neuro:  AAO x 3    LABS:  Results for orders placed or performed in visit on 03/08/14   AMB POC URINALYSIS DIP STICK AUTO W/O MICRO   Result Value Ref Range    Color (UA POC) Yellow     Clarity (UA POC) Clear  Glucose (UA POC) Negative Negative    Bilirubin (UA POC) Negative Negative    Ketones (UA POC) Negative Negative    Specific gravity (UA POC) 1.020 1.001 - 1.035     Blood (UA POC) Trace Negative    pH (UA POC) 7.0 4.6 - 8.0    Protein (UA POC) Negative Negative mg/dL    Urobilinogen (UA POC) 1 mg/dL 0.2 - 1    Nitrites (UA POC) Negative Negative    Leukocyte esterase (UA POC) Negative Negative      PSA /TESTOSTERONE - [facility] PSA PSA   Latest Ref Rng 0.00 - 4.00 ng/mL 0.00 - 4.00 ng/mL   03/08/2014 0.06    04/05/2013  0.05   09/15/2012  0.09   03/09/2012  0.13   10/05/2011  0.309   02/18/2011  0.546   12/22/2010  0.92   08/13/2010  0.27   05/05/2010  0.18     PSA /TESTOSTERONE - [facility] Creatinine Testosterone, total   Latest Ref Rng 0.76 - 1.27 mg/dL 348 - 1197 ng/dL   03/08/2014     04/05/2013  194 (A)   09/15/2012     03/09/2012     10/05/2011     02/18/2011 0.70 (L)    12/22/2010     08/13/2010     05/05/2010         This note has been sent to the referring physician, with findings and recommendations.    Crist Infante, MD, FACS          CC:  Debbe Bales, MD  [place] [address]

## 2014-04-04 ENCOUNTER — Encounter: Attending: Urology | Primary: Family Medicine

## 2014-06-13 DIAGNOSIS — J439 Emphysema, unspecified: Secondary | ICD-10-CM | POA: Insufficient documentation

## 2014-06-13 DIAGNOSIS — J432 Centrilobular emphysema: Secondary | ICD-10-CM | POA: Insufficient documentation

## 2014-06-13 DIAGNOSIS — I10 Essential (primary) hypertension: Secondary | ICD-10-CM | POA: Insufficient documentation

## 2014-06-13 DIAGNOSIS — E782 Mixed hyperlipidemia: Secondary | ICD-10-CM | POA: Insufficient documentation

## 2014-06-13 DIAGNOSIS — C61 Malignant neoplasm of prostate: Secondary | ICD-10-CM | POA: Insufficient documentation

## 2014-08-06 DIAGNOSIS — Z87898 Personal history of other specified conditions: Secondary | ICD-10-CM | POA: Insufficient documentation

## 2014-08-13 DIAGNOSIS — I480 Paroxysmal atrial fibrillation: Secondary | ICD-10-CM | POA: Insufficient documentation

## 2014-09-27 ENCOUNTER — Emergency Department
Admit: 2014-09-27 | Disposition: A | Discharge status: Home / Self Care | Payer: Self-pay | Admitting: Emergency Medicine

## 2014-10-12 ENCOUNTER — Other Ambulatory Visit: Payer: Self-pay | Admitting: Cardiology

## 2014-10-24 ENCOUNTER — Encounter: Payer: Self-pay | Admitting: *Deleted

## 2014-10-24 ENCOUNTER — Inpatient Hospital Stay
Admission: AD | Admit: 2014-10-24 | Discharge: 2014-10-26 | DRG: 310 | Disposition: A | Discharge status: Home / Self Care | Payer: Medicare Other | Source: Ambulatory Visit | Attending: Cardiology | Admitting: Cardiology

## 2014-10-24 DIAGNOSIS — I10 Essential (primary) hypertension: Secondary | ICD-10-CM | POA: Diagnosis present

## 2014-10-24 DIAGNOSIS — J439 Emphysema, unspecified: Secondary | ICD-10-CM | POA: Diagnosis present

## 2014-10-24 DIAGNOSIS — I4891 Unspecified atrial fibrillation: Secondary | ICD-10-CM | POA: Diagnosis present

## 2014-10-24 HISTORY — DX: Cardiac arrhythmia, unspecified: I49.9

## 2014-10-24 HISTORY — DX: Chronic obstructive pulmonary disease, unspecified: J44.9

## 2014-10-24 LAB — BASIC METABOLIC PANEL
ANION GAP: 6 (ref 5–15)
BUN: 26 mg/dL — AB (ref 6–20)
CHLORIDE: 103 mmol/L (ref 101–111)
CO2: 27 mmol/L (ref 22–32)
CREATININE: 1.19 mg/dL (ref 0.61–1.24)
Calcium: 9.6 mg/dL (ref 8.9–10.3)
GFR calc Af Amer: >60 mL/min (ref >60)
GFR calc non Af Amer: 58 mL/min — ABNORMAL LOW (ref >60)
Glucose, Bld: 153 mg/dL — ABNORMAL HIGH (ref 65–99)
Potassium: 4.2 mmol/L (ref 3.5–5.1)
Sodium: 136 mmol/L (ref 135–145)

## 2014-10-24 LAB — MAGNESIUM: MAGNESIUM: 2.1 mg/dL (ref 1.7–2.4)

## 2014-10-24 MED ORDER — PRAVASTATIN SODIUM 20 MG PO TABS
40.0000 mg | ORAL_TABLET | Freq: Every day | ORAL | Status: DC
Start: 2014-10-24 — End: 2014-10-26
  Administered 2014-10-24 – 2014-10-25 (×2): 40 mg via ORAL
  Filled 2014-10-24 (×2): qty 2

## 2014-10-24 MED ORDER — METOPROLOL TARTRATE 25 MG PO TABS
25.0000 mg | ORAL_TABLET | Freq: Two times a day (BID) | ORAL | Status: DC
  Administered 2014-10-24 – 2014-10-26 (×4): 25 mg via ORAL
  Filled 2014-10-24 (×4): qty 1

## 2014-10-24 MED ORDER — DOFETILIDE 500 MCG PO CAPS
500.0000 ug | ORAL_CAPSULE | Freq: Two times a day (BID) | ORAL | Status: DC
  Filled 2014-10-24 (×3): qty 1

## 2014-10-24 MED ORDER — SODIUM CHLORIDE 0.9 % IJ SOLN
3.0000 mL | Freq: Two times a day (BID) | INTRAMUSCULAR | Status: DC
  Administered 2014-10-24 – 2014-10-26 (×5): 3 mL via INTRAVENOUS

## 2014-10-24 MED ORDER — TIOTROPIUM BROMIDE MONOHYDRATE 18 MCG IN CAPS
18.0000 ug | ORAL_CAPSULE | Freq: Every day | RESPIRATORY_TRACT | Status: DC
  Administered 2014-10-25 – 2014-10-26 (×2): 18 ug via RESPIRATORY_TRACT
  Filled 2014-10-24: qty 5

## 2014-10-24 MED ORDER — DOFETILIDE 500 MCG PO CAPS
500.0000 ug | ORAL_CAPSULE | Freq: Two times a day (BID) | ORAL | Status: DC
  Administered 2014-10-24 (×2): 500 ug via ORAL
  Filled 2014-10-24 (×4): qty 1

## 2014-10-24 MED ORDER — SODIUM CHLORIDE 0.9 % IJ SOLN: 3.0000 mL | INTRAMUSCULAR | Status: DC | PRN

## 2014-10-24 MED ORDER — SODIUM CHLORIDE 0.9 % IV SOLN: 250.0000 mL | INTRAVENOUS | Status: DC | PRN

## 2014-10-24 MED ORDER — TAMSULOSIN HCL 0.4 MG PO CAPS
0.4000 mg | ORAL_CAPSULE | Freq: Every day | ORAL | Status: DC
Start: 2014-10-24 — End: 2014-10-26
  Administered 2014-10-25 – 2014-10-26 (×2): 0.4 mg via ORAL
  Filled 2014-10-24 (×2): qty 1

## 2014-10-24 MED ORDER — RIVAROXABAN 20 MG PO TABS
20.0000 mg | ORAL_TABLET | Freq: Every day | ORAL | Status: DC
  Administered 2014-10-24 – 2014-10-25 (×2): 20 mg via ORAL
  Filled 2014-10-24 (×2): qty 1

## 2014-10-24 MED ORDER — LOSARTAN POTASSIUM 50 MG PO TABS
100.0000 mg | ORAL_TABLET | Freq: Every day | ORAL | Status: DC
  Administered 2014-10-25 – 2014-10-26 (×2): 100 mg via ORAL
  Filled 2014-10-24 (×2): qty 2

## 2014-10-24 MED ORDER — IPRATROPIUM-ALBUTEROL 0.5-2.5 (3) MG/3ML IN SOLN: 3.0000 mL | RESPIRATORY_TRACT | Status: DC | PRN

## 2014-10-24 MED ORDER — RIVAROXABAN 20 MG PO TABS: 20.0000 mg | ORAL_TABLET | Freq: Every day | ORAL | Status: DC

## 2014-10-24 NOTE — Progress Notes (Addendum)
Pharmacy Review for Dofetilide (Tikosyn) Initiation  Admit Complaint: 74 y.o. male admitted 10/24/2014 with atrial fibrillation to be initiated on dofetilide.   Assessment:  Patient Exclusion Criteria: If any screening criteria checked as "Yes", then  patient  should NOT receive dofetilide until criteria item is corrected. If "Yes" please indicate correction plan.  YES  NO Patient  Exclusion Criteria Correction Plan  []  [x]  Baseline QTc interval is greater than or equal to 440 msec. IF above YES box checked dofetilide contraindicated unless patient has ICD; then may proceed if QTc 500-550 msec or with known ventricular conduction abnormalities may proceed with QTc 550-600 msec. QTc = 431   []  [x]  Magnesium level is less than 1.8 mEq/l : Last magnesium:  Lab Results  Component Value Date   MG 2.1 10/24/2014         []  [x]  Potassium level is less than 4 mEq/l : Last potassium:  Lab Results  Component Value Date   K 4.2 10/24/2014         []  [x]  Patient is known or suspected to have a digoxin level greater than 2 ng/ml: No results found for: DIGOXIN    []  [x]  Creatinine clearance less than 20 ml/min (calculated using Cockcroft-Gault, actual body weight and serum creatinine): Estimated Creatinine Clearance: 54.5 mL/min (by C-G formula based on Cr of 1.19).    []  [x]  Patient has received drugs known to prolong the QT intervals within the last 48 hours (phenothiazines, tricyclics or tetracyclic antidepressants, erythromycin, H-1 antihistamines, cisapride, fluoroquinolones, azithromycin). Drugs not listed above may have an, as yet, undetected potential to prolong the QT interval, updated information on QT prolonging agents is available at this website:QT prolonging agents   []  [x]  Patient received a dose of hydrochlorothiazide (Oretic) alone or in any combination including triamterene (Dyazide, Maxzide) in the last 48 hours.   []  [x]  Patient received a medication known to increase  dofetilide plasma concentrations prior to initial dofetilide dose:  . Trimethoprim (Primsol, Proloprim) in the last 36 hours . Verapamil (Calan, Verelan) in the last 36 hours or a sustained release dose in the last 72 hours . Megestrol (Megace) in the last 5 days  . Cimetidine (Tagamet) in the last 6 hours . Ketoconazole (Nizoral) in the last 24 hours . Itraconazole (Sporanox) in the last 48 hours  . Prochlorperazine (Compazine) in the last 36 hours    []  [x]  Patient is known to have a history of torsades de pointes; congenital or acquired long QT syndromes.   []  [x]  Patient has received a Class 1 antiarrhythmic with less than 2 half-lives since last dose. (Disopyramide, Quinidine, Procainamide, Lidocaine, Mexiletine, Flecainide, Propafenone)   []  [x]  Patient has received amiodarone therapy in the past 3 months or amiodarone level is greater than 0.3 ng/ml.    Patient has been appropriately anticoagulated withXarelto 20 mg po daily.  Ordering provider was confirmed at [email or web address] if they are not listed on the [place] Prescribers list.  Goal of Therapy: Follow renal function, electrolytes, potential drug interactions, and dose adjustment. Provide education and 1 week supply at discharge.  Plan:  [x]   Physician selected initial dose within range recommended for patients level of renal function - will monitor for response.  []   Physician selected initial dose outside of range recommended for patients level of renal function - will discuss if the dose should be altered at this time.   Select One Calculated CrCl  Dose q12h  [x]  > 60 ml/min 500 mcg  []   40-60 ml/min 250 mcg  []  20-40 ml/min 125 mcg   2. Follow up QTc after the first 5 doses, renal function, electrolytes (K & Mg) daily x 3 days, dose adjustment, success of initiation and facilitate 1 week discharge supply as clinically indicated.  3. Initiate Tikosyn education video (Call [phone] and ask for video #  116).  4. Place Enrollment Form on the chart for discharge supply of dofetilide.   Per patient's CrCl (using actual body weight) = 59.5 mL/min. Will dose dofetilide 500 mcg po BID. Will order follow-up AM labs are coordinate QTc monitoring with nurse.  Jacobsen,Sonja M 1:05 PM 10/24/2014  QTC is 471. No dosage adjustment necessary. Will f/u in AM.

## 2014-10-24 NOTE — H&P (Signed)
  History and Physical  Patient ID: Logan Morgan MRN: [id number] DOB/AGE: 74/31/1942 74 y.o. Admit date: 10/24/2014  Primary Care Physician: Tracie Harrier, MD Primary Cardiologist Dr. Ubaldo Glassing  HPI:  Patient is a 74 year old male with history of emphysema as well as paroxysmally atrial fibrillation.  He has been treated with rivaroxaban and his rate is being controlled with metoprolol succinate.  He has had symptomatic atrial fibrillation.  He is now being admitted for loading with dofetilide and consideration for cardioversion after a 3 day load.  He denies syncope or presyncope.  He has some shortness of breath which is felt to be secondary to his emphysema as well as intermittent atrial fibrillation.  He remains quite active.  Review of systems complete and found to be negative unless listed above  No past medical history on file.  No family history on file.  History   Social History  . Marital Status: Married    Spouse Name: N/A  . Number of Children: N/A  . Years of Education: N/A   Occupational History  . Not on file.   Social History Main Topics  . Smoking status: Not on file  . Smokeless tobacco: Not on file  . Alcohol Use: Not on file  . Drug Use: Not on file  . Sexual Activity: Not on file   Other Topics Concern  . Not on file   Social History Narrative  . No narrative on file    No past surgical history on file.   No prescriptions prior to admission    Physical Exam: Blood pressure 109/68, pulse 80, temperature 97.7 F (36.5 C), temperature source Oral, resp. rate 18, SpO2 97 %.   This SmartLink requires parameters. Parameters are variables that are added to the [facility] name to request specific information. The parameter for lastwt is the number of readings to display.  For example: .lastwt[4  In this example, the [facility] displays the last four encounter readings.   General appearance: alert, cooperative and appears stated age Head: Normocephalic,  without obvious abnormality, atraumatic Eyes: conjunctivae/corneas clear. PERRL, EOM's intact. Fundi benign. Neck: no adenopathy, no carotid bruit, no JVD, supple, symmetrical, trachea midline and thyroid not enlarged, symmetric, no tenderness/mass/nodules Resp: clear to auscultation bilaterally Chest wall: no tenderness Cardio: S1, S2 normal GI: soft, non-tender; bowel sounds normal; no masses,  no organomegaly Extremities: extremities normal, atraumatic, no cyanosis or edema Pulses: 2+ and symmetric Neurologic: Grossly normal Labs:  No results found for: WBC, HGB, HCT, MCV, PLT No results for input(s): NA, K, CL, CO2, BUN, CREATININE, CALCIUM, PROT, BILITOT, ALKPHOS, ALT, AST, GLUCOSE in the last 168 hours.  Invalid input(s): LABALBU No results found for: CKTOTAL, CKMB, CKMBINDEX, TROPONINI No results found for: CHOL No results found for: HDL No results found for: LDLCALC No results found for: TRIG No results found for: CHOLHDL No results found for: LDLDIRECT     EKG: SR   ASSESSMENT AND PLAN:    Patient with history of paroxysmally atrial fibrillation admitted for loading with dofetilide.  He will be in the hospital for 2 nights.  Will consider cardioversion if not in sinus rhythm on hospital day 3.  Signed: Teodoro Spray MD, [facility] 10/24/2014, 9:20 AM

## 2014-10-24 NOTE — Care Management (Signed)
Patient is direct admit from [facility] for elective cardioversion.  Confirmed with Estill Bamberg from [facility].  At present, would not anticipate any discharge needs

## 2014-10-25 LAB — BASIC METABOLIC PANEL
ANION GAP: 7 (ref 5–15)
BUN: 19 mg/dL (ref 6–20)
CO2: 25 mmol/L (ref 22–32)
Calcium: 9 mg/dL (ref 8.9–10.3)
Chloride: 109 mmol/L (ref 101–111)
Creatinine, Ser: 0.81 mg/dL (ref 0.61–1.24)
GFR calc Af Amer: >60 mL/min (ref >60)
GFR calc non Af Amer: >60 mL/min (ref >60)
Glucose, Bld: 86 mg/dL (ref 65–99)
Potassium: 3.8 mmol/L (ref 3.5–5.1)
Sodium: 141 mmol/L (ref 135–145)

## 2014-10-25 LAB — MAGNESIUM: Magnesium: 1.9 mg/dL (ref 1.7–2.4)

## 2014-10-25 MED ORDER — DOFETILIDE 250 MCG PO CAPS
250.0000 ug | ORAL_CAPSULE | Freq: Two times a day (BID) | ORAL | Status: DC
  Administered 2014-10-25 – 2014-10-26 (×3): 250 ug via ORAL
  Filled 2014-10-25 (×4): qty 1

## 2014-10-25 MED ORDER — POTASSIUM CHLORIDE CRYS ER 20 MEQ PO TBCR
20.0000 meq | EXTENDED_RELEASE_TABLET | Freq: Every day | ORAL | Status: DC
  Administered 2014-10-25 – 2014-10-26 (×2): 20 meq via ORAL
  Filled 2014-10-25 (×2): qty 1

## 2014-10-25 NOTE — Progress Notes (Addendum)
Pharmacy Review for Dofetilide (Tikosyn) Initiation  Admit Complaint: 74 y.o. male admitted 10/24/2014 with atrial fibrillation to be initiated on dofetilide.   Assessment:  Patient Exclusion Criteria: If any screening criteria checked as "Yes", then  patient  should NOT receive dofetilide until criteria item is corrected. If "Yes" please indicate correction plan.  YES  NO Patient  Exclusion Criteria Correction Plan  []  [x]  Baseline QTc interval is greater than or equal to 440 msec. IF above YES box checked dofetilide contraindicated unless patient has ICD; then may proceed if QTc 500-550 msec or with known ventricular conduction abnormalities may proceed with QTc 550-600 msec. QTc = 431   []  [x]  Magnesium level is less than 1.8 mEq/l : Last magnesium:  Lab Results  Component Value Date   MG 1.9 10/25/2014         []  [x]  Potassium level is less than 4 mEq/l : Last potassium:  Lab Results  Component Value Date   K 3.8 10/25/2014       Given KCl 62mEq PO  []  [x]  Patient is known or suspected to have a digoxin level greater than 2 ng/ml: No results found for: DIGOXIN    []  [x]  Creatinine clearance less than 20 ml/min (calculated using Cockcroft-Gault, actual body weight and serum creatinine): Estimated Creatinine Clearance: 80 mL/min (by C-G formula based on Cr of 0.81).    []  [x]  Patient has received drugs known to prolong the QT intervals within the last 48 hours (phenothiazines, tricyclics or tetracyclic antidepressants, erythromycin, H-1 antihistamines, cisapride, fluoroquinolones, azithromycin). Drugs not listed above may have an, as yet, undetected potential to prolong the QT interval, updated information on QT prolonging agents is available at this website:QT prolonging agents   []  [x]  Patient received a dose of hydrochlorothiazide (Oretic) alone or in any combination including triamterene (Dyazide, Maxzide) in the last 48 hours.   []  [x]  Patient received a medication known to  increase dofetilide plasma concentrations prior to initial dofetilide dose:  . Trimethoprim (Primsol, Proloprim) in the last 36 hours . Verapamil (Calan, Verelan) in the last 36 hours or a sustained release dose in the last 72 hours . Megestrol (Megace) in the last 5 days  . Cimetidine (Tagamet) in the last 6 hours . Ketoconazole (Nizoral) in the last 24 hours . Itraconazole (Sporanox) in the last 48 hours  . Prochlorperazine (Compazine) in the last 36 hours    []  [x]  Patient is known to have a history of torsades de pointes; congenital or acquired long QT syndromes.   []  [x]  Patient has received a Class 1 antiarrhythmic with less than 2 half-lives since last dose. (Disopyramide, Quinidine, Procainamide, Lidocaine, Mexiletine, Flecainide, Propafenone)   []  [x]  Patient has received amiodarone therapy in the past 3 months or amiodarone level is greater than 0.3 ng/ml.    Patient has been appropriately anticoagulated with Xarelto 20 mg po daily.  Ordering provider was confirmed at [email or web address] if they are not listed on the [place] Prescribers list.  Goal of Therapy: Follow renal function, electrolytes, potential drug interactions, and dose adjustment. Provide education and 1 week supply at discharge.  Plan:  [x]   Physician selected initial dose within range recommended for patients level of renal function - will monitor for response.  []   Physician selected initial dose outside of range recommended for patients level of renal function - will discuss if the dose should be altered at this time.   Select One Calculated CrCl  Dose q12h  [x]  >  60 ml/min 500 mcg  []  40-60 ml/min 250 mcg  []  20-40 ml/min 125 mcg   2. Follow up QTc after the first 5 doses, renal function, electrolytes (K & Mg) daily x 3 days, dose adjustment, success of initiation and facilitate 1 week discharge supply as clinically indicated.  3. Initiate Tikosyn education video (Call [phone] and ask for video #  116).  4. Place Enrollment Form on the chart for discharge supply of dofetilide.   5/18: Per patient's CrCl (using actual body weight) = 59.5 mL/min. Will dose dofetilide 500 mcg po BID. Will order follow-up AM labs and coordinate QTc monitoring with nurse. --  QTC is 471. No dosage adjustment necessary. Will f/u in AM.  --  5/19: QTC is 507. AM Potassium is 3.8. Notified nurse. MD reduced dose to dofetilide 250 mcg PO bid to start today @ 0800 and initiated KCl 20 mEq PO daily. Will need to follow QTC 2-3 hours after next dofetilide dose. Follow-up AM labs.  -- 5/19 QTC at 12:30 = 481. Ok to continue with dofetilide 259mcg BID.  Will need to follow QTC 2-3 hours after next dofetilide dose scheduled for 20:00 this evening. Follow-up AM labs.  Pharmacy to follow per consult  Rexene Edison, PharmD Clinical Pharmacist 10/25/2014 4:04 PM     5/19 PM QTc 483 ms. Continue current regimen. Recheck in AM.  Sim Boast, PharmD, BCPS  10/26/2014

## 2014-10-25 NOTE — Progress Notes (Signed)
Pt up in the room. Tolerated meals well.  No verbalization of pain or discomfort..  Bed in low position, call bell within reach.

## 2014-10-25 NOTE — Progress Notes (Signed)
Subjective:  Doing well from a cardiac standpoint. Remains in sinus rhythm. QTC has prolonged to 507 ms. We will reduce dofetilide to 250 g twice daily and follow QTC. Serum potassium was 3.8. Will supplement with 20 mEq daily. We'll continue and follow. Plan discharge in a.m. if stable. We will defer consideration for cardioversion as he remains in sinus rhythm.  Objective:  Vital Signs in the last 24 hours: Temp:  [97.5 F (36.4 C)-97.7 F (36.5 C)] 97.5 F (36.4 C) (05/19 1102) Pulse Rate:  [65-72] 65 (05/19 1102) Resp:  [16-18] 16 (05/19 1102) BP: (134-162)/(76-94) 141/83 mmHg (05/19 1102) SpO2:  [93 %-95 %] 93 % (05/19 1102) Weight:  [75.751 kg (167 lb)] 75.751 kg (167 lb) (05/19 0526)  Intake/Output from previous day: 05/18 0701 - 05/19 0700 In: 483 [P.O.:480; I.V.:3] Out: 1100 [Urine:1100] Intake/Output from this shift: Total I/O In: 360 [P.O.:360] Out: -   Physical Exam: General appearance: alert, cooperative and appears stated age Lungs: clear to auscultation bilaterally Heart: regular rate and rhythm, S1, S2 normal, no murmur, click, rub or gallop Abdomen: soft, non-tender; bowel sounds normal; no masses,  no organomegaly Extremities: extremities normal, atraumatic, no cyanosis or edema Neurologic: Alert and oriented X 3, normal strength and tone. Normal symmetric reflexes. Normal coordination and gait  Lab Results: No results for input(s): WBC, HGB, PLT in the last 72 hours.  Recent Labs  10/24/14 1012 10/25/14 0420  NA 136 141  K 4.2 3.8  CL 103 109  CO2 27 25  GLUCOSE 153* 86  BUN 26* 19  CREATININE 1.19 0.81   No results for input(s): TROPONINI in the last 72 hours.  Invalid input(s): CK, MB Hepatic Function Panel No results for input(s): PROT, ALBUMIN, AST, ALT, ALKPHOS, BILITOT, BILIDIR, IBILI in the last 72 hours. No results for input(s): CHOL in the last 72 hours. No results for input(s): PROTIME in the last 72 hours.    Cardiac  Studies:EKG-SR with qtc of 507  Assessment/Plan:  Doing well from a cardiac standpoint. We'll continue with dofetilide however we'll reduce the dose to 250 g twice daily. We'll continue with xarelto 20 mEq daily and supplement serum potassium at 20 mEq daily. We will defer cardioversion  LOS: 1 day    Javid Kemler A. 10/25/2014, 12:36 PM

## 2014-10-25 NOTE — Progress Notes (Signed)
Pharmacy Review for Dofetilide (Tikosyn) Initiation  Admit Complaint: 75 y.o. male admitted 10/24/2014 with atrial fibrillation to be initiated on dofetilide.   Assessment:  Patient Exclusion Criteria: If any screening criteria checked as "Yes", then  patient  should NOT receive dofetilide until criteria item is corrected. If "Yes" please indicate correction plan.  YES  NO Patient  Exclusion Criteria Correction Plan  []  [x]  Baseline QTc interval is greater than or equal to 440 msec. IF above YES box checked dofetilide contraindicated unless patient has ICD; then may proceed if QTc 500-550 msec or with known ventricular conduction abnormalities may proceed with QTc 550-600 msec. QTc = 431   []  [x]  Magnesium level is less than 1.8 mEq/l : Last magnesium:  Lab Results  Component Value Date   MG 1.9 10/25/2014         []  [x]  Potassium level is less than 4 mEq/l : Last potassium:  Lab Results  Component Value Date   K 3.8 10/25/2014         []  [x]  Patient is known or suspected to have a digoxin level greater than 2 ng/ml: No results found for: DIGOXIN    []  [x]  Creatinine clearance less than 20 ml/min (calculated using Cockcroft-Gault, actual body weight and serum creatinine): Estimated Creatinine Clearance: 80 mL/min (by C-G formula based on Cr of 0.81).    []  [x]  Patient has received drugs known to prolong the QT intervals within the last 48 hours (phenothiazines, tricyclics or tetracyclic antidepressants, erythromycin, H-1 antihistamines, cisapride, fluoroquinolones, azithromycin). Drugs not listed above may have an, as yet, undetected potential to prolong the QT interval, updated information on QT prolonging agents is available at this website:QT prolonging agents   []  [x]  Patient received a dose of hydrochlorothiazide (Oretic) alone or in any combination including triamterene (Dyazide, Maxzide) in the last 48 hours.   []  [x]  Patient received a medication known to increase dofetilide  plasma concentrations prior to initial dofetilide dose:  . Trimethoprim (Primsol, Proloprim) in the last 36 hours . Verapamil (Calan, Verelan) in the last 36 hours or a sustained release dose in the last 72 hours . Megestrol (Megace) in the last 5 days  . Cimetidine (Tagamet) in the last 6 hours . Ketoconazole (Nizoral) in the last 24 hours . Itraconazole (Sporanox) in the last 48 hours  . Prochlorperazine (Compazine) in the last 36 hours    []  [x]  Patient is known to have a history of torsades de pointes; congenital or acquired long QT syndromes.   []  [x]  Patient has received a Class 1 antiarrhythmic with less than 2 half-lives since last dose. (Disopyramide, Quinidine, Procainamide, Lidocaine, Mexiletine, Flecainide, Propafenone)   []  [x]  Patient has received amiodarone therapy in the past 3 months or amiodarone level is greater than 0.3 ng/ml.    Patient has been appropriately anticoagulated with Xarelto 20 mg po daily.  Ordering provider was confirmed at [email or web address] if they are not listed on the [facility] Prescribers list.  Goal of Therapy: Follow renal function, electrolytes, potential drug interactions, and dose adjustment. Provide education and 1 week supply at discharge.  Plan:  [x]   Physician selected initial dose within range recommended for patients level of renal function - will monitor for response.  []   Physician selected initial dose outside of range recommended for patients level of renal function - will discuss if the dose should be altered at this time.   Select One Calculated CrCl  Dose q12h  [x]  > 60 ml/min 500  mcg  []  40-60 ml/min 250 mcg  []  20-40 ml/min 125 mcg   2. Follow up QTc after the first 5 doses, renal function, electrolytes (K & Mg) daily x 3 days, dose adjustment, success of initiation and facilitate 1 week discharge supply as clinically indicated.  3. Initiate Tikosyn education video (Call [phone] and ask for video # [phone]).  4. Place  Enrollment Form on the chart for discharge supply of dofetilide.   5/18: Per patient's CrCl (using actual body weight) = 59.5 mL/min. Will dose dofetilide 500 mcg po BID. Will order follow-up AM labs and coordinate QTc monitoring with nurse.  Joen Laura 8:02 AM 10/25/2014  QTC is 471. No dosage adjustment necessary. Will f/u in AM.    5/19: QTC is 507. AM Potassium is 3.8. Notified nurse. MD reduced dose to dofetilide 250 mcg PO bid to start today @ 0800 and initiated KCl 20 mEq PO daily. Will need to follow QTC 2-3 hours after next dofetilide dose. Follow-up AM labs.  Joen Laura 10/25/2014

## 2014-10-25 NOTE — Progress Notes (Signed)
Pt alert and oriented x4, no complaints of pain or discomfort.  Bed in low position, call bell within reach.  Bed alarms not on, not protocol for this pt.   Assessment done and charted.  Will continue to monitor and do hourly rounding throughout the shift.  Pt is up in the room ambulating.  Voiding without complaints.  Wife visiting the pt this am.  Will continue to monitor

## 2014-10-26 LAB — MAGNESIUM: Magnesium: 2 mg/dL (ref 1.7–2.4)

## 2014-10-26 LAB — BASIC METABOLIC PANEL
Anion gap: 5 (ref 5–15)
BUN: 17 mg/dL (ref 6–20)
CALCIUM: 9.4 mg/dL (ref 8.9–10.3)
CO2: 24 mmol/L (ref 22–32)
Chloride: 111 mmol/L (ref 101–111)
Creatinine, Ser: 0.8 mg/dL (ref 0.61–1.24)
GFR calc Af Amer: >60 mL/min (ref >60)
Glucose, Bld: 96 mg/dL (ref 65–99)
Potassium: 3.9 mmol/L (ref 3.5–5.1)
SODIUM: 140 mmol/L (ref 135–145)

## 2014-10-26 MED ORDER — DOFETILIDE 250 MCG PO CAPS: 250.0000 ug | ORAL_CAPSULE | Freq: Two times a day (BID) | ORAL | Status: DC

## 2014-10-26 MED ORDER — POTASSIUM CHLORIDE CRYS ER 20 MEQ PO TBCR: 20.0000 meq | EXTENDED_RELEASE_TABLET | Freq: Every day | ORAL | Status: AC

## 2014-10-26 NOTE — Progress Notes (Signed)
Spoke with Dr. Ubaldo Glassing, who was on the floor, re AM labs as follows, K: 3.9, Mg 2.0 and QTc per tele clerk is 0.46. Per Dr. Ubaldo Glassing, [place] to give Tikosyn as ordered.

## 2014-10-26 NOTE — Progress Notes (Signed)
Pt. Discharged to home. Discharge instructions and medication regimen reviewed at bedside with patient and wife. both verbalize understanding of instructions and medication regimen. Prescriptions have been called into pharmacies. Patient assessment unchanged from this morning. TELE and IV discontinued per policy.

## 2014-10-26 NOTE — Discharge Instructions (Signed)
Dofetilide capsules °What is this medicine? °DOFETILIDE (doe FET il ide) is an antiarrhythmic drug. It helps make your heart beat regularly. This medicine also helps to slow rapid heartbeats. °This medicine may be used for other purposes; ask your health care provider or pharmacist if you have questions. °COMMON BRAND NAME(S): Tikosyn °What should I tell my health care provider before I take this medicine? °They need to know if you have any of these conditions: °-heart disease or heart failure °-history of low levels of potassium or magnesium °-kidney disease °-liver disease °-an unusual or allergic reaction to dofetilide, other medicines, foods, dyes, or preservatives °-pregnant or trying to get pregnant °-breast-feeding °How should I use this medicine? °Take this medicine by mouth with a glass of water. Follow the directions on the prescription label. You can take this medicine with or without food. Do not drink grapefruit juice with this medicine. Take your doses at regular intervals. Do not take your medicine more often than directed. Do not stop taking this medicine suddenly. This may cause serious, heart-related side effects. Your doctor will tell you how much medicine to take. If your doctor wants you to stop the medicine, the dose will be slowly lowered over time to avoid any side effects. °Talk to your pediatrician regarding the use of this medicine in children. Special care may be needed. °Overdosage: If you think you have taken too much of this medicine contact a poison control center or emergency room at once. °NOTE: This medicine is only for you. Do not share this medicine with others. °What if I miss a dose? °If you miss a dose, take it as soon as you can. If it is almost time for your next dose, take only that dose. Do not take double or extra doses. °What may interact with this medicine? °Do not take this medicine with any of the following medications: °-arsenic trioxide °-certain macrolide  antibiotics °-certain medicines for fungal infections like ketoconazole and itraconazole °-certain medicines for HIV, AIDS called protease inhibitors like lopinavir; ritonavir, ritonavir, and saquinavir °-certain quinolone antibiotics °-cimetidine °-cisapride °-conivaptan °-cyclobenzaprine °-droperidol °-grapefruit juice °-haloperidol °-hawthorn °-imatinib °-levomethadyl °-medicines for depression, anxiety, or psychotic disturbances °-medicines for malaria like chloroquine and halofantrine °-megestrol °-memantine °-metformin °-methadone °-mibefradil °-mifepristone °-other medicines to control heart rhythm °-pentamidine °-phenothiazines like chlorpromazine, mesoridazine, prochlorperazine, thioridazine °-ranolazine °-sertindole °-telithromycin °-thiazide diuretics °-trimethoprim °-trospium °-vardenafil °-verapamil °-ziprasidone °This medicine may also interact with the following medications: °-antidepressants called SSRIs °-certain medicines for fungal infections like fluconazole or voriconazole °-certain heart medicines like digoxin or diltiazem °-diuretics °-dronabinol, THC °-norfloxacin °-other medicines for HIV, AIDS called protease inhibitors °-quinine °-zafirlukast °This list may not describe all possible interactions. Give your health care provider a list of all the medicines, herbs, non-prescription drugs, or dietary supplements you use. Also tell them if you smoke, drink alcohol, or use illegal drugs. Some items may interact with your medicine. °What should I watch for while using this medicine? °Visit your doctor or health care professional for regular checks on your progress. Wear a medical ID bracelet or chain, and carry a card that describes your disease and details of your medicine and dosage times. °Check your heart rate and blood pressure regularly while you are taking this medicine. Ask your doctor or health care professional what your heart rate and blood pressure should be, and when you should  contact him or her. Your doctor or health care professional also may schedule regular tests to check your progress. °You will be started   on this medicine in a specialized facility for at least three days. You will be monitored to find the right dose of medicine for you. It is very important that you take your medicine exactly as prescribed when you leave the hospital. The correct dosing of this medicine is very important to treat your condition and prevent possible serious side effects. °What side effects may I notice from receiving this medicine? °Side effects that you should report to your doctor or health care professional as soon as possible: °-allergic reactions like skin rash, itching or hives, swelling of the face, lips, or tongue °-breathing problems °-dizziness °-fast or rapid beating of the heart °-feeling faint or lightheaded °-swelling of the ankles °-unusually weak or tired °-vomiting °Side effects that usually do not require medical attention (report to your doctor or health care professional if they continue or are bothersome): °-cough °-diarrhea °-difficulty sleeping °-headache °-nausea °-stomach pain °This list may not describe all possible side effects. Call your doctor for medical advice about side effects. You may report side effects to FDA at 1-800-FDA-1088. °Where should I keep my medicine? °Keep out of the reach of children. °Store at room temperature between 15 and 30 degrees C (59 and 86 degrees F). Protect the medicine from moisture or humidity. Keep container tightly closed. Throw away any unused medicine after the expiration date. °NOTE: This sheet is a summary. It may not cover all possible information. If you have questions about this medicine, talk to your doctor, pharmacist, or health care provider. °© 2015, [employer]vier/[employer]. (2012-11-23 16:40:57) ° °

## 2014-10-26 NOTE — Care Management (Signed)
Informed that patient's pharmacy- [facility] will not have Tikosin until Monday.  Informed that [facility] pharmacy needed to speak with CM to provide patient medication.  CM spoke with [facility] and store does have medication in stock.  Informed Dr Ubaldo Glassing and script sent to [facility].  Patient informed.   Confirmed that pharmacy had the referral prior to patient leaving unit.   His maintenance scripts were sent to his mail order pharmacy per Dr Ubaldo Glassing (K+ and Tisosyn.)

## 2014-10-26 NOTE — Progress Notes (Signed)
Called [facility] pharmacy to ensure that prescription was called over before pt is discharged. Pharmacists confirmed that they have received prescription.

## 2014-10-26 NOTE — Discharge Summary (Signed)
Physician Discharge Summary  Patient ID: Logan Morgan MRN: [id number] DOB/AGE: 1940-10-08 74 y.o.  Admit date: 10/24/2014 Discharge date: 10/26/2014  Primary Discharge Diagnosis Atrial Fibrillation Secondary Discharge Diagnosis Hypertension  Significant Diagnostic Studies: cardiac graphics: ECG: SR with QTc at discharge less than 500 and yes  Consults: None  Hospital Course: Pt was admitted for loading with dofetilide. Initial ekg was afib with rvr. Converted to nsr prior to first dofetilide dose. Started at 569mcg bid with increase in qtc to 506. Dose reduced to 250 mcg bid with qtc less than 500. Remained in nsr. K was 3.9 and Mg 2.0 at discharge. No symptoms. Will continue admission meds with addition of dofetilide at 250 mcg bid and kdur 20 meq daily. Follow up in 1 week   Discharge Exam: Blood pressure 142/81, pulse 71, temperature 97.8 F (36.6 C), temperature source Oral, resp. rate 18, height 5\' 9"  (1.753 m), weight 74.844 kg (165 lb), SpO2 94 %.   General appearance: alert and cooperative Head: Normocephalic, without obvious abnormality, atraumatic Resp: clear to auscultation bilaterally Cardio: regular rate and rhythm, S1, S2 normal, no murmur, click, rub or gallop GI: soft, non-tender; bowel sounds normal; no masses,  no organomegaly Neurologic: Grossly normal Labs:  No results found for: WBC, HGB, HCT, MCV, PLT  Recent Labs Lab 10/26/14 0607  NA 140  K 3.9  CL 111  CO2 24  BUN 17  CREATININE 0.80  CALCIUM 9.4  GLUCOSE 96       EKG: nsr  FOLLOW UP PLANS AND APPOINTMENTS    Medication List    TAKE these medications        dofetilide 250 MCG capsule  Commonly known as:  TIKOSYN  Take 1 capsule (250 mcg total) by mouth 2 (two) times daily.     folic acid 782 MCG tablet  Commonly known as:  FOLVITE  Take 400 mcg by mouth daily.     glucosamine-chondroitin 500-400 MG tablet  Take 1 tablet by mouth 2 (two) times daily.     ipratropium-albuterol  0.5-2.5 (3) MG/3ML Soln  Commonly known as:  DUONEB  Take 3 mLs by nebulization every 4 (four) hours as needed (for shortness of breath).     losartan 100 MG tablet  Commonly known as:  COZAAR  Take 100 mg by mouth daily.     metoprolol succinate 25 MG 24 hr tablet  Commonly known as:  TOPROL-XL  Take 25 mg by mouth daily.     milk thistle 175 MG tablet  Take 175 mg by mouth daily.     potassium chloride SA 20 MEQ tablet  Commonly known as:  K-DUR,KLOR-CON  Take 1 tablet (20 mEq total) by mouth daily.     pravastatin 40 MG tablet  Commonly known as:  PRAVACHOL  Take 40 mg by mouth daily.     rivaroxaban 20 MG Tabs tablet  Commonly known as:  XARELTO  Take 20 mg by mouth daily with supper.     tamsulosin 0.4 MG Caps capsule  Commonly known as:  FLOMAX  Take 0.4 mg by mouth daily.     tiotropium 18 MCG inhalation capsule  Commonly known as:  SPIRIVA  Place 18 mcg into inhaler and inhale daily.     vitamin C 1000 MG tablet  Take 1,000 mg by mouth daily.           Follow-up Information    Follow up with Bartholome Bill A., MD. Call in 1 week.  Specialty:  Cardiology   Why:  for ekg recheck   Contact information:   [place] [address] [phone]       BRING ALL MEDICATIONS WITH YOU TO FOLLOW UP APPOINTMENTS   Signed:  Teodoro Spray MD, [facility] 10/26/2014, 7:42 AM

## 2014-11-26 ENCOUNTER — Ambulatory Visit
Admission: RE | Admit: 2014-11-26 | Payer: Managed Care, Other (non HMO) | Source: Ambulatory Visit | Admitting: Cardiology

## 2014-11-26 ENCOUNTER — Encounter: Admission: RE | Payer: Self-pay | Source: Ambulatory Visit

## 2014-11-26 SURGERY — CARDIOVERSION (CATH LAB)
Anesthesia: General

## 2015-05-30 ENCOUNTER — Emergency Department
Admission: EM | Admit: 2015-05-30 | Discharge: 2015-05-30 | Disposition: A | Discharge status: Home / Self Care | Payer: Medicare Other | Attending: Emergency Medicine | Admitting: Emergency Medicine

## 2015-05-30 DIAGNOSIS — R040 Epistaxis: Secondary | ICD-10-CM | POA: Insufficient documentation

## 2015-05-30 DIAGNOSIS — Z87891 Personal history of nicotine dependence: Secondary | ICD-10-CM | POA: Insufficient documentation

## 2015-05-30 DIAGNOSIS — Z79899 Other long term (current) drug therapy: Secondary | ICD-10-CM | POA: Diagnosis not present

## 2015-05-30 MED ORDER — CEPHALEXIN 500 MG PO CAPS: 500.0000 mg | ORAL_CAPSULE | Freq: Three times a day (TID) | ORAL | Status: DC

## 2015-05-30 MED ORDER — LIDOCAINE HCL 4 % EX SOLN
Freq: Once | CUTANEOUS | Status: AC
  Administered 2015-05-30: 05:00:00 via TOPICAL

## 2015-05-30 MED ORDER — CEPHALEXIN 500 MG PO CAPS
500.0000 mg | ORAL_CAPSULE | Freq: Once | ORAL | Status: AC
  Administered 2015-05-30: 500 mg via ORAL
  Filled 2015-05-30: qty 1

## 2015-05-30 MED ORDER — PHENYLEPHRINE HCL 0.5 % NA SOLN
2.0000 [drp] | Freq: Once | NASAL | Status: AC
  Administered 2015-05-30: 2 [drp] via NASAL

## 2015-05-30 MED ORDER — CEPHALEXIN 500 MG PO CAPS: 500.0000 mg | ORAL_CAPSULE | Freq: Four times a day (QID) | ORAL | Status: DC

## 2015-05-30 NOTE — ED Provider Notes (Signed)
[facility] Emergency Department Provider Note  ____________________________________________  Time seen: Approximately 5:16 AM  I have reviewed the triage vital signs and the nursing notes.   HISTORY  Chief Complaint Epistaxis    HPI Logan Morgan is a 74 y.o. male who presents to the ED with a chief complaint of nosebleed. Patient has frequent nosebleeds, especially in winter, history of cauterization although he is able to control most bleeds with direct pressure and ice at home. Patient is on Xarelto for atrial fibrillation. Complains of left-sided nosebleed starting approximately 1 AM while he was going to sleep. Denies complaints of trauma, headache, dizziness/lightheadedness. Denies chest pain, shortness of breath, abdominal pain, nausea, vomiting. Nothing makes symptoms better or worse. Nasal clamp applied at triage   Past Medical History  Diagnosis Date  . Dysrhythmia   . COPD (chronic obstructive pulmonary disease)     Patient Active Problem List   Diagnosis Date Noted  . A-fib ([place]) 10/24/2014  . AF (paroxysmal atrial fibrillation) ([place]) 08/13/2014  . 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 Essential (primary) hypertension 06/13/2014    No past surgical history on file.  Current Outpatient Rx  Name  Route  Sig  Dispense  Refill  . Ascorbic Acid (VITAMIN C) 1000 MG tablet   Oral   Take 1,000 mg by mouth daily.         Marland Kitchen dofetilide (TIKOSYN) 250 MCG capsule   Oral   Take 1 capsule (250 mcg total) by mouth 2 (two) times daily.   60 capsule   6   . folic acid (FOLVITE) [id number] MCG tablet   Oral   Take 400 mcg by mouth daily.         Marland Kitchen glucosamine-chondroitin 500-400 MG tablet   Oral   Take 1 tablet by mouth 2 (two) times daily.          Marland Kitchen ipratropium-albuterol (DUONEB) 0.5-2.5 (3) MG/3ML SOLN   Nebulization   Take 3 mLs by nebulization every 4 (four) hours as needed (for shortness of breath).          .  losartan (COZAAR) 100 MG tablet   Oral   Take 100 mg by mouth daily.         . metoprolol succinate (TOPROL-XL) 25 MG 24 hr tablet   Oral   Take 25 mg by mouth daily.         . milk thistle 175 MG tablet   Oral   Take 175 mg by mouth daily.         . potassium chloride SA (K-DUR,KLOR-CON) 20 MEQ tablet   Oral   Take 1 tablet (20 mEq total) by mouth daily.   30 tablet   6   . pravastatin (PRAVACHOL) 40 MG tablet   Oral   Take 40 mg by mouth daily.         . rivaroxaban (XARELTO) 20 MG TABS tablet   Oral   Take 20 mg by mouth daily with supper.         . tamsulosin (FLOMAX) 0.4 MG CAPS capsule   Oral   Take 0.4 mg by mouth daily.         Marland Kitchen tiotropium (SPIRIVA) 18 MCG inhalation capsule   Inhalation   Place 18 mcg into inhaler and inhale daily.           Allergies Review of patient's allergies indicates no known allergies.  No family history on file.  Social History Social  History  Substance Use Topics  . Smoking status: Former Research scientist (life sciences)  . Smokeless tobacco: Not on file  . Alcohol Use: Yes    Review of Systems Constitutional: No fever/chills Eyes: No visual changes. ENT: Positive for nosebleed. No sore throat. Cardiovascular: Denies chest pain. Respiratory: Denies shortness of breath. Gastrointestinal: No abdominal pain.  No nausea, no vomiting.  No diarrhea.  No constipation. Genitourinary: Negative for dysuria. Musculoskeletal: Negative for back pain. Skin: Negative for rash. Neurological: Negative for headaches, focal weakness or numbness.  10-point ROS otherwise negative.  ____________________________________________   PHYSICAL EXAM:  VITAL SIGNS: ED Triage Vitals  Enc Vitals Group     BP 05/30/15 0257 168/103 mmHg     Pulse Rate 05/30/15 0257 71     Resp 05/30/15 0257 18     Temp --      Temp Source 05/30/15 0257 Oral     SpO2 05/30/15 0257 95 %     Weight 05/30/15 0257 170 lb (77.111 kg)     Height 05/30/15 0257 5\' 9"  (1.753  m)     Head Cir --      Peak Flow --      Pain Score --      Pain Loc --      Pain Edu? --      Excl. in [place]? --     Constitutional: Alert and oriented. Well appearing and in no acute distress. Eyes: Conjunctivae are normal. PERRL. EOMI. Head: Atraumatic. Nose: Nasal clamp removed; active bleeding from left nare.  Mouth/Throat: Mucous membranes are moist.  Oropharynx non-erythematous. No bleeding in posterior oropharynx. Neck: No stridor.   Cardiovascular: Normal rate, regular rhythm. Grossly normal heart sounds.  Good peripheral circulation. Respiratory: Normal respiratory effort.  No retractions. Lungs CTAB. Gastrointestinal: Soft and nontender. No distention. No abdominal bruits. No CVA tenderness. Musculoskeletal: No lower extremity tenderness nor edema.  No joint effusions. Neurologic:  Normal speech and language. No gross focal neurologic deficits are appreciated. No gait instability. Skin:  Skin is warm, dry and intact. No rash noted. No petechiae. Psychiatric: Mood and affect are normal. Speech and behavior are normal.  ____________________________________________   LABS (all labs ordered are listed, but only abnormal results are displayed)  Labs Reviewed - No data to display ____________________________________________  EKG  None ____________________________________________  RADIOLOGY  None ____________________________________________   PROCEDURES  Procedure(s) performed:   Nasal packing with Merocel sponge Nasal packing with Rhino Rocket  Critical Care performed: No  ____________________________________________   INITIAL IMPRESSION / ASSESSMENT AND PLAN / ED COURSE  Pertinent labs & imaging results that were available during my care of the patient were reviewed by me and considered in my medical decision making (see chart for details).  74 year old male who presents with left-sided nosebleed, onset Xarelto. Nasal clamp was applied in triage. Upon my  removal, there is active bleeding from left near. Cotton ball dipped with lidocaine plus Afrin inserted into left nare, nasal clamp reapplied, ice applied to bridge of nose.  ----------------------------------------- 5:46 AM on 05/30/2015 -----------------------------------------  Cotton ball removed; mild active bleeding from left nare. There is no bleeding into posterior oropharynx. Small area of irritation noted to left medial septum. Merocel sponge applied.  ----------------------------------------- 5:58 AM on 05/30/2015 -----------------------------------------  Patient was bleeding around the sponge. Sponge removed and left nare repacked with Rhino Rocket.  ----------------------------------------- 6:20 AM on 05/30/2015 -----------------------------------------  Patient reexamined; there is no bleeding around the nasal packing.blood pressure improved from prior. There is no bleeding  and posterior oropharynx. Patient denies feeling lightheaded, dizzy, nauseous. Will place on Keflex and refer to ENT for outpatient follow-up in 5 days to remove nasal packing. Strict return precautions given. Patient and spouse verbalize understanding and agree with plan of care. ____________________________________________   FINAL CLINICAL IMPRESSION(S) / ED DIAGNOSES  Final diagnoses:  Epistaxis      Paulette Blanch, MD 05/30/15 [phone]

## 2015-05-30 NOTE — ED Notes (Signed)
Patient discharge and follow up information reviewed with patient by ED nursing staff and patient given the opportunity to ask questions pertaining to ED visit and discharge plan of care. Patient advised that should symptoms not continue to improve, resolve entirely, or should new symptoms develop then a follow up visit with their PCP or a return visit to the ED may be warranted. Patient verbalized consent and understanding of discharge plan of care including potential need for further evaluation. Patient being discharged in stable condition per attending ED physician on duty.  Patient sent home with nasal clamp and roll of tape per MD direction.

## 2015-05-30 NOTE — ED Notes (Signed)
Family member to desk reporting that patient is bleeding around sponge. MD and this RN to bedside - rhino-rocket inserted by Beather Arbour, MD. Will follow up on effectiveness in 20 minutes per MD direction.

## 2015-05-30 NOTE — ED Notes (Addendum)
Patient ambulatory to triage with steady gait, without difficulty or distress noted; pt reports left sided nosebleed x hr; denies any c/o HA or dizziness, st hx of same; denies any recent illness; currently taking xarelto; nose clamp applied

## 2015-05-30 NOTE — ED Notes (Signed)
Nosebleed kit mixed per protocol by MD. Filbert Schilder ball soaked in solution; packed into LEFT nare by Dr. Beather Arbour. Ice pack applied to nose by RN per MD direction.

## 2015-05-30 NOTE — ED Notes (Addendum)
MD packed LEFT nare with nasal sponge at this time.

## 2015-05-30 NOTE — Discharge Instructions (Signed)
1. Take antibiotic as prescribed (Keflex 500 mg 3 times daily 7 days). 2. Keep nasal packing in place. 3. Return to the ER for recurrent or worsening symptoms, fever, persistent vomiting, difficulty breathing or other concerns.  Nosebleed Nosebleeds are common. They are due to a crack in the inside lining of your nose (mucous membrane) or from a small blood vessel that starts to bleed. Nosebleeds can be caused by many conditions, such as injury, infections, dry mucous membranes or dry climate, medicines, nose picking, and home heating and cooling systems. Most nosebleeds come from blood vessels in the front of your nose. HOME CARE INSTRUCTIONS   Try controlling your nosebleed by pinching your nostrils gently and continuously for at least 10 minutes.  Avoid blowing or sniffing your nose for a number of hours after having a nosebleed.  Do not put gauze inside your nose yourself. If your nose was packed by your health care provider, try to maintain the pack inside of your nose until your health care provider removes it.  If a gauze pack was used and it starts to fall out, gently replace it or cut off the end of it.  If a balloon catheter was used to pack your nose, do not cut or remove it unless your health care provider has instructed you to do that.  Avoid lying down while you are having a nosebleed. Sit up and lean forward.  Use a nasal spray decongestant to help with a nosebleed as directed by your health care provider.  Do not use petroleum jelly or mineral oil in your nose. These can drip into your lungs.  Maintain humidity in your home by using less air conditioning or by using a humidifier.  Aspirinand blood thinners make bleeding more likely. If you are prescribed these medicines and you suffer from nosebleeds, ask your health care provider if you should stop taking the medicines or adjust the dose. Do not stop medicines unless directed by your health care provider  Resume your  normal activities as you are able, but avoid straining, lifting, or bending at the waist for several days.  If your nosebleed was caused by dry mucous membranes, use over-the-counter saline nasal spray or gel. This will keep the mucous membranes moist and allow them to heal. If you must use a lubricant, choose the water-soluble variety. Use it only sparingly, and do not use it within several hours of lying down.  Keep all follow-up visits as directed by your health care provider. This is important. SEEK MEDICAL CARE IF:  You have a fever.  You get frequent nosebleeds.  You are getting nosebleeds more often. SEEK IMMEDIATE MEDICAL CARE IF:  Your nosebleed lasts longer than 20 minutes.  Your nosebleed occurs after an injury to your face, and your nose looks crooked or broken.  You have unusual bleeding from other parts of your body.  You have unusual bruising on other parts of your body.  You feel light-headed or you faint.  You become sweaty.  You vomit blood.  Your nosebleed occurs after a head injury.   This information is not intended to replace advice given to you by your health care provider. Make sure you discuss any questions you have with your health care provider.   Document Released: 03/04/2005 Document Revised: 06/15/2014 Document Reviewed: 01/08/2014 Elsevier Interactive Patient Education [employer].

## 2016-06-29 ENCOUNTER — Encounter: Payer: Self-pay | Admitting: Emergency Medicine

## 2016-06-29 ENCOUNTER — Emergency Department
Admission: EM | Admit: 2016-06-29 | Discharge: 2016-06-29 | Disposition: A | Discharge status: Home / Self Care | Payer: Medicare HMO | Attending: Emergency Medicine | Admitting: Emergency Medicine

## 2016-06-29 DIAGNOSIS — K648 Other hemorrhoids: Secondary | ICD-10-CM | POA: Diagnosis not present

## 2016-06-29 DIAGNOSIS — K625 Hemorrhage of anus and rectum: Secondary | ICD-10-CM | POA: Diagnosis present

## 2016-06-29 DIAGNOSIS — I10 Essential (primary) hypertension: Secondary | ICD-10-CM | POA: Insufficient documentation

## 2016-06-29 DIAGNOSIS — Z79899 Other long term (current) drug therapy: Secondary | ICD-10-CM | POA: Insufficient documentation

## 2016-06-29 DIAGNOSIS — Z87891 Personal history of nicotine dependence: Secondary | ICD-10-CM | POA: Insufficient documentation

## 2016-06-29 DIAGNOSIS — J449 Chronic obstructive pulmonary disease, unspecified: Secondary | ICD-10-CM | POA: Diagnosis not present

## 2016-06-29 DIAGNOSIS — K644 Residual hemorrhoidal skin tags: Secondary | ICD-10-CM

## 2016-06-29 LAB — COMPREHENSIVE METABOLIC PANEL
ALBUMIN: 4.4 g/dL (ref 3.5–5.0)
ALT: 32 U/L (ref 17–63)
ANION GAP: 7 (ref 5–15)
AST: 34 U/L (ref 15–41)
Alkaline Phosphatase: 39 U/L (ref 38–126)
BILIRUBIN TOTAL: 0.7 mg/dL (ref 0.3–1.2)
BUN: 14 mg/dL (ref 6–20)
CO2: 25 mmol/L (ref 22–32)
Calcium: 9.2 mg/dL (ref 8.9–10.3)
Chloride: 108 mmol/L (ref 101–111)
Creatinine, Ser: 0.92 mg/dL (ref 0.61–1.24)
GFR calc Af Amer: >60 mL/min (ref >60)
GFR calc non Af Amer: >60 mL/min (ref >60)
GLUCOSE: 86 mg/dL (ref 65–99)
POTASSIUM: 4.1 mmol/L (ref 3.5–5.1)
SODIUM: 140 mmol/L (ref 135–145)
TOTAL PROTEIN: 7.1 g/dL (ref 6.5–8.1)

## 2016-06-29 LAB — CBC
HEMATOCRIT: 45.7 % (ref 40.0–52.0)
HEMOGLOBIN: 15.7 g/dL (ref 13.0–18.0)
MCH: 32 pg (ref 26.0–34.0)
MCHC: 34.4 g/dL (ref 32.0–36.0)
MCV: 93.1 fL (ref 80.0–100.0)
Platelets: 217 10*3/uL (ref 150–440)
RBC: 4.91 MIL/uL (ref 4.40–5.90)
RDW: 13.5 % (ref 11.5–14.5)
WBC: 4.9 10*3/uL (ref 3.8–10.6)

## 2016-06-29 LAB — TYPE AND SCREEN
ABO/RH(D): O POS
ANTIBODY SCREEN: NEGATIVE

## 2016-06-29 NOTE — ED Notes (Addendum)
Pt to ed with c/o rectal bleeding, bright red in color, reports one episode last night and 2 this am.  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 Pt skin warm and dry and good color.  Pt alert and oriented, appears in no acute distress.  Pt states recent emotional stress related to family issues and has recently had diarrhea.  Pt denies dizziness, denies fever, denies rectal pain.  Pt is currently on xarelto.

## 2016-06-29 NOTE — ED Provider Notes (Signed)
[facility] Emergency Department Provider Note  ____________________________________________  Time seen: Approximately 10:42 AM  I have reviewed the triage vital signs and the nursing notes.   HISTORY  Chief Complaint Rectal Bleeding    HPI Logan Morgan is a 76 y.o. male with a history of internal and external bleeding hemorrhoids, A. fib on Xarelto, presenting for bright red blood per rectum. The patient reports that he chronically has a "protruding hemorrhoid," and that it will occasionally cause blood streaks on the toilet paper, especially in the setting of hard stool or diarrhea. Over the past several days, the patient has had 3 daily loose stools due to increased stress without fever, abdominal pain. Several days ago he saw a blood streak on the toilet paper, and then last night developed frank bright red blood on the paper and in the toilet, with a small amount in his underwear. He denies any lightheadedness, chest pain, palpitations, fainting, abdominal pain or fever. He has no pain with defecation.   Past Medical History:  Diagnosis Date  . COPD (chronic obstructive pulmonary disease) ([place])   . Dysrhythmia     Patient Active Problem List   Diagnosis Date Noted  . A-fib ([place]) 10/24/2014  . AF (paroxysmal atrial fibrillation) ([place]) 08/13/2014  . 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 Essential (primary) hypertension 06/13/2014    History reviewed. No pertinent surgical history.  Current Outpatient Rx  . Order #: [id number] Class: Historical Med  . Order #: [id number] Class: Print  . Order #: [id number] Class: Print  . Order #: [id number] Class: Historical Med  . Order #: [id number] Class: Historical Med  . Order #: [id number] Class: Historical Med  . Order #: [id number] Class: Historical Med  . Order #: [id number] Class: Historical Med  . Order #: [id number] Class: Historical Med  . Order #: [id number] Class: Print  . Order #:  [id number] Class: Historical Med  . Order #: [id number] Class: Historical Med  . Order #: [id number] Class: Historical Med  . Order #: [id number] Class: Historical Med    Allergies Patient has no known allergies.  No family history on file.  Social History Social History  Substance Use Topics  . Smoking status: Former Research scientist (life sciences)  . Smokeless tobacco: Never Used  . Alcohol use Yes    Review of Systems Constitutional: No fever/chills. No lightheadedness or syncope. No general malaise. Eyes: No visual changes. ENT: . No congestion or rhinorrhea. Cardiovascular: Denies chest pain. Denies palpitations. Respiratory: Denies shortness of breath.  No cough. Gastrointestinal: No abdominal pain.  No nausea, no vomiting.  No diarrhea.  No constipation. Genitourinary: Negative for dysuria. Positive for bright red blood per rectum. Negative for pain with defecation. Musculoskeletal: Negative for back pain. Skin: Negative for rash. Neurological: Negative for headaches. No focal numbness, tingling or weakness.   10-point ROS otherwise negative.  ____________________________________________   PHYSICAL EXAM:  VITAL SIGNS: ED Triage Vitals [06/29/16 0838]  Enc Vitals Group     BP (!) 167/91     Pulse Rate 78     Resp 18     Temp 98.9 F (37.2 C)     Temp Source Oral     SpO2 96 %     Weight 170 lb (77.1 kg)     Height      Head Circumference      Peak Flow      Pain Score      Pain Loc      Pain Edu?      Excl. in [place]?  Constitutional: Alert and oriented. Well appearing and in no acute distress. Answers questions appropriately. Eyes: Conjunctivae are normalAnd without pallor.  EOMI. No scleral icterus. Head: Atraumatic. Nose: No congestion/rhinnorhea. Mouth/Throat: Mucous membranes are moist.  Neck: No stridor.  Supple.  No JVD. No meningismus. Cardiovascular: Normal rate, regular rhythm. No murmurs, rubs or gallops.  Respiratory: Normal respiratory effort.  No accessory muscle  use or retractions. Lungs CTAB.  No wheezes, rales or ronchi. Gastrointestinal: Soft, nontender and nondistended.  No guarding or rebound.  No peritoneal signs. Genitourinary: Large external hemorrhoid approximately 1 x 0.5 cm at 6:00 that is nonbleeding and non-thrombosed. Multiple palpable internal hemorrhoids. Brown stool mixed with minimal bright red blood on rectal examination. Guaiac positive. Musculoskeletal: No LE edema.  Neurologic:  A&Ox3.  Speech is clear.  Face and smile are symmetric.  EOMI.  Moves all extremities well. Skin:  Skin is warm, dry and intact. No rash noted. Psychiatric: Mood and affect are normal. Speech and behavior are normal.  Normal judgement. ____________________________________________   LABS (all labs ordered are listed, but only abnormal results are displayed)  Labs Reviewed  COMPREHENSIVE METABOLIC PANEL  CBC  POC OCCULT BLOOD, ED  TYPE AND SCREEN   ____________________________________________  EKG  ED ECG REPORT I, Eula Listen, the attending physician, personally viewed and interpreted this ECG.   Date: 06/29/2016  EKG Time: 914  Rate: 69  Rhythm: normal sinus rhythm  Axis: normal  Intervals:none  ST&T Change: Nonspecific T-wave inversion in V1. No STEMI.  ____________________________________________  RADIOLOGY  No results found.  ____________________________________________   PROCEDURES  Procedure(s) performed: None  Procedures  Critical Care performed: No ____________________________________________   INITIAL IMPRESSION / ASSESSMENT AND PLAN / ED COURSE  Pertinent labs & imaging results that were available during my care of the patient were reviewed by me and considered in my medical decision making (see chart for details).  76 y.o. male with a history of hemorrhoids on Xarelto for A. fib presenting with bright red blood per rectum. Overall, the patient's vital signs are reassuring and he is not having any  symptoms consistent with anemia or hypovolemia. He does have clinical exam findings that are consistent with internal bleeding hemorrhoids, although no evidence of a large blood loss. His hemoglobin and hematocrit are 15.7 and 45.7, and he is not hemodynamically unstable. I will plan to speak with his cardiologist, Dr. Ubaldo Glassing, about holding his Xarelto, as well as Dr. Allen Norris, the GI physician on-call for follow-up. At this time, the patient is stable for discharge, but he understands return precautions as well as follow-up instructions.  ----------------------------------------- 10:48 AM on 06/29/2016 -----------------------------------------  Dr. Allen Norris will plan to see the patient this week for follow up.  I have spoken with Dr. Gigi Gin, who is on call for Dr. Ubaldo Glassing and recommends holding Xarelto for 3d w/ f/u to discuss when to restart.  ____________________________________________  FINAL CLINICAL IMPRESSION(S) / ED DIAGNOSES  Final diagnoses:  Rectal bleeding  Internal hemorrhoids with complication  External hemorrhoids with complication         NEW MEDICATIONS STARTED DURING THIS VISIT:  New Prescriptions   No medications on file      Eula Listen, MD 06/29/16 1056

## 2016-06-29 NOTE — ED Notes (Signed)
MD at bedside. 

## 2016-06-29 NOTE — Discharge Instructions (Addendum)
Please drink plenty of fluid to stay well-hydrated.  Please do not take your Xarelto for up to 3 days, and make an appointment with Dr. Ubaldo Glassing to discuss when to restart this medication.  Return to the emergency department if you develop severe pain, increased bleeding, lightheadedness, palpitations, shortness of breath or chest pain, fever, abdominal pain, or any other symptoms concerning to you.

## 2016-06-29 NOTE — ED Triage Notes (Signed)
Pt presents with rectal bleeding one time last night and twice this am. Pt with hx of hemorrhoids and has had same issue in the past. Denies any other sx.

## 2016-07-02 ENCOUNTER — Other Ambulatory Visit: Payer: Self-pay

## 2016-07-02 ENCOUNTER — Encounter: Payer: Self-pay | Admitting: Gastroenterology

## 2016-07-02 ENCOUNTER — Ambulatory Visit (INDEPENDENT_AMBULATORY_CARE_PROVIDER_SITE_OTHER): Payer: Medicare HMO | Admitting: Gastroenterology

## 2016-07-02 VITALS — BP 157/84 | HR 72 | Temp 98.0°F | Ht 68.0 in | Wt 175.0 lb

## 2016-07-02 DIAGNOSIS — K921 Melena: Secondary | ICD-10-CM

## 2016-07-02 NOTE — Progress Notes (Signed)
Gastroenterology Consultation  Referring Provider:     Tracie Harrier, MD Primary Care Physician:  Tracie Harrier, MD Primary Gastroenterologist:  Dr. Allen Norris     Reason for Consultation:     Hematochezia        HPI:   Logan Morgan is a 76 y.o. y/o male referred for consultation & management of Hematochezia by Dr. Tracie Harrier, MD.  This patient comes in today after being seen in the emergency room recently for rectal bleeding. The patient has been on Xarelto for A. fib. The patient reports that a few days prior to his visit in the emergency department he had some streaks of blood. He also reports that he has protruding hemorrhoids. The patient was then worried when he had a large amount of blood per rectum. He states that his bleeding usually happens when he is constipated. The patient had a hemoglobin of 15.7 in the emergency department. The patient had no sign of any symptomatic GI bleed. There is no report of any dizziness chest pain shortness of breath or hypotension. The patient was recommended to follow-up with his cardiologist and with me. It appears that the patient had a colonoscopy in 2015.  Past Medical History:  Diagnosis Date  . COPD (chronic obstructive pulmonary disease) ([place])   . Dysrhythmia     No past surgical history on file.  Prior to Admission medications   Medication Sig Start Date End Date Taking? Authorizing Provider  Ascorbic Acid (VITAMIN C) 1000 MG tablet Take 1,000 mg by mouth daily.    Historical Provider, MD  cephALEXin (KEFLEX) 500 MG capsule Take 1 capsule (500 mg total) by mouth 3 (three) times daily. 05/30/15   Paulette Blanch, MD  dofetilide (TIKOSYN) 250 MCG capsule Take 1 capsule (250 mcg total) by mouth 2 (two) times daily. 10/26/14   Teodoro Spray, MD  folic acid (FOLVITE) Q000111Q MCG tablet Take 400 mcg by mouth daily.    Historical Provider, MD  glucosamine-chondroitin 500-400 MG tablet Take 1 tablet by mouth 2 (two) times daily.     Historical  Provider, MD  ipratropium-albuterol (DUONEB) 0.5-2.5 (3) MG/3ML SOLN Take 3 mLs by nebulization every 4 (four) hours as needed (for shortness of breath).     Historical Provider, MD  losartan (COZAAR) 100 MG tablet Take 100 mg by mouth daily.    Historical Provider, MD  metoprolol succinate (TOPROL-XL) 25 MG 24 hr tablet Take 25 mg by mouth daily.    Historical Provider, MD  metoprolol tartrate (LOPRESSOR) 25 MG tablet Take by mouth. 04/24/16 04/24/17  Historical Provider, MD  milk thistle 175 MG tablet Take 175 mg by mouth daily.    Historical Provider, MD  mometasone-formoterol (DULERA) 100-5 MCG/ACT AERO Inhale into the lungs. 07/08/15 07/07/16  Historical Provider, MD  potassium chloride SA (K-DUR,KLOR-CON) 20 MEQ tablet Take 1 tablet (20 mEq total) by mouth daily. 10/26/14   Teodoro Spray, MD  pravastatin (PRAVACHOL) 40 MG tablet Take 40 mg by mouth daily.    Historical Provider, MD  rivaroxaban (XARELTO) 20 MG TABS tablet Take 20 mg by mouth daily with supper.    Historical Provider, MD  sertraline (ZOLOFT) 50 MG tablet TAKE 1 TABLET ONCE DAILY 05/12/16   Historical Provider, MD  tamsulosin (FLOMAX) 0.4 MG CAPS capsule Take 0.4 mg by mouth daily.    Historical Provider, MD  tiotropium (SPIRIVA) 18 MCG inhalation capsule Place 18 mcg into inhaler and inhale daily.    Historical Provider, MD  No family history on file.   Social History  Substance Use Topics  . Smoking status: Former Research scientist (life sciences)  . Smokeless tobacco: Never Used  . Alcohol use Yes    Allergies as of 07/02/2016  . (No Known Allergies)    Review of Systems:    All systems reviewed and negative except where noted in HPI.   Physical Exam:  There were no vitals taken for this visit. No LMP for male patient. Psych:  Alert and cooperative. Normal mood and affect. General:   Alert,  Well-developed, well-nourished, pleasant and cooperative in NAD Head:  Normocephalic and atraumatic. Eyes:  Sclera clear, no icterus.    Conjunctiva pink. Ears:  Normal auditory acuity. Nose:  No deformity, discharge, or lesions. Mouth:  No deformity or lesions,oropharynx pink & moist. Neck:  Supple; no masses or thyromegaly. Lungs:  Respirations even and unlabored.  Clear throughout to auscultation.   No wheezes, crackles, or rhonchi. No acute distress. Heart:  Regular rate and rhythm; no murmurs, clicks, rubs, or gallops. Abdomen:  Normal bowel sounds.  No bruits.  Soft, non-tender and non-distended without masses, hepatosplenomegaly or hernias noted.  No guarding or rebound tenderness.  Negative Carnett sign.   Rectal:  Deferred.  Msk:  Symmetrical without gross deformities.  Good, equal movement & strength bilaterally. Pulses:  Normal pulses noted. Extremities:  No clubbing or edema.  No cyanosis. Neurologic:  Alert and oriented x3;  grossly normal neurologically. Skin:  Intact without significant lesions or rashes.  No jaundice. Lymph Nodes:  No significant cervical adenopathy. Psych:  Alert and cooperative. Normal mood and affect.  Imaging Studies: No results found.  Assessment and Plan:   Logan Morgan is a 76 y.o. y/o male who comes in with rectal bleeding. The patient had a colonoscopy 2 years ago at an outside facility without any worrisome findings. The patient has been under a lot of stress with the recent murder of his son and daughter-in-law. The patient has been told that since it was only 1 episode of rectal bleeding we would hold off on any further intervention unless he had another episode of bleeding. The likely cause of his bleeding were hemorrhoids. The patient has been explained the plan and agrees with it.    Lucilla Lame, MD. Marval Regal   Note: This dictation was prepared with Dragon dictation along with smaller phrase technology. Any transcriptional errors that result from this process are unintentional.

## 2016-07-28 ENCOUNTER — Inpatient Hospital Stay
Admission: EM | Admit: 2016-07-28 | Discharge: 2016-07-31 | DRG: 310 | Disposition: A | Discharge status: Home / Self Care | Payer: Medicare HMO | Attending: Internal Medicine | Admitting: Internal Medicine

## 2016-07-28 DIAGNOSIS — R0902 Hypoxemia: Secondary | ICD-10-CM | POA: Diagnosis present

## 2016-07-28 DIAGNOSIS — N400 Enlarged prostate without lower urinary tract symptoms: Secondary | ICD-10-CM | POA: Diagnosis present

## 2016-07-28 DIAGNOSIS — I472 Ventricular tachycardia, unspecified: Secondary | ICD-10-CM

## 2016-07-28 DIAGNOSIS — Z825 Family history of asthma and other chronic lower respiratory diseases: Secondary | ICD-10-CM

## 2016-07-28 DIAGNOSIS — Z87891 Personal history of nicotine dependence: Secondary | ICD-10-CM

## 2016-07-28 DIAGNOSIS — I480 Paroxysmal atrial fibrillation: Secondary | ICD-10-CM | POA: Diagnosis present

## 2016-07-28 DIAGNOSIS — I4891 Unspecified atrial fibrillation: Secondary | ICD-10-CM

## 2016-07-28 DIAGNOSIS — R7981 Abnormal blood-gas level: Secondary | ICD-10-CM

## 2016-07-28 DIAGNOSIS — J449 Chronic obstructive pulmonary disease, unspecified: Secondary | ICD-10-CM | POA: Diagnosis present

## 2016-07-28 DIAGNOSIS — I447 Left bundle-branch block, unspecified: Secondary | ICD-10-CM | POA: Diagnosis present

## 2016-07-28 DIAGNOSIS — Z79899 Other long term (current) drug therapy: Secondary | ICD-10-CM

## 2016-07-28 DIAGNOSIS — E785 Hyperlipidemia, unspecified: Secondary | ICD-10-CM | POA: Diagnosis present

## 2016-07-28 DIAGNOSIS — I4892 Unspecified atrial flutter: Secondary | ICD-10-CM | POA: Diagnosis present

## 2016-07-28 DIAGNOSIS — Z8546 Personal history of malignant neoplasm of prostate: Secondary | ICD-10-CM | POA: Diagnosis not present

## 2016-07-28 DIAGNOSIS — I10 Essential (primary) hypertension: Secondary | ICD-10-CM | POA: Diagnosis present

## 2016-07-28 DIAGNOSIS — F329 Major depressive disorder, single episode, unspecified: Secondary | ICD-10-CM | POA: Diagnosis present

## 2016-07-28 DIAGNOSIS — Z7901 Long term (current) use of anticoagulants: Secondary | ICD-10-CM

## 2016-07-28 DIAGNOSIS — R040 Epistaxis: Secondary | ICD-10-CM | POA: Diagnosis present

## 2016-07-28 LAB — COMPREHENSIVE METABOLIC PANEL
ALK PHOS: 39 U/L (ref 38–126)
ALT: 33 U/L (ref 17–63)
ANION GAP: 9 (ref 5–15)
AST: 38 U/L (ref 15–41)
Albumin: 4.3 g/dL (ref 3.5–5.0)
BILIRUBIN TOTAL: 0.7 mg/dL (ref 0.3–1.2)
BUN: 16 mg/dL (ref 6–20)
CALCIUM: 9.2 mg/dL (ref 8.9–10.3)
CO2: 21 mmol/L — ABNORMAL LOW (ref 22–32)
CREATININE: 0.79 mg/dL (ref 0.61–1.24)
Chloride: 109 mmol/L (ref 101–111)
GFR calc non Af Amer: >60 mL/min (ref >60)
Glucose, Bld: 89 mg/dL (ref 65–99)
Potassium: 4.1 mmol/L (ref 3.5–5.1)
Sodium: 139 mmol/L (ref 135–145)
TOTAL PROTEIN: 6.9 g/dL (ref 6.5–8.1)

## 2016-07-28 LAB — CBC
HEMATOCRIT: 45.5 % (ref 40.0–52.0)
HEMOGLOBIN: 15.7 g/dL (ref 13.0–18.0)
MCH: 32 pg (ref 26.0–34.0)
MCHC: 34.5 g/dL (ref 32.0–36.0)
MCV: 92.9 fL (ref 80.0–100.0)
Platelets: 238 10*3/uL (ref 150–440)
RBC: 4.9 MIL/uL (ref 4.40–5.90)
RDW: 13.2 % (ref 11.5–14.5)
WBC: 7.5 10*3/uL (ref 3.8–10.6)

## 2016-07-28 LAB — MRSA PCR SCREENING: MRSA by PCR: NEGATIVE

## 2016-07-28 LAB — TSH: TSH: 2.777 u[IU]/mL (ref 0.350–4.500)

## 2016-07-28 LAB — MAGNESIUM: Magnesium: 2 mg/dL (ref 1.7–2.4)

## 2016-07-28 LAB — APTT: aPTT: 44 seconds — ABNORMAL HIGH (ref 24–36)

## 2016-07-28 LAB — PROTIME-INR
INR: 2.17
PROTHROMBIN TIME: 24.5 s — AB (ref 11.4–15.2)

## 2016-07-28 LAB — GLUCOSE, CAPILLARY: Glucose-Capillary: 127 mg/dL — ABNORMAL HIGH (ref 65–99)

## 2016-07-28 LAB — TROPONIN I: Troponin I: <0.03 ng/mL (ref <0.03)

## 2016-07-28 MED ORDER — AMIODARONE HCL IN DEXTROSE 360-4.14 MG/200ML-% IV SOLN
60.0000 mg/h | INTRAVENOUS | Status: DC
  Filled 2016-07-28 (×2): qty 200

## 2016-07-28 MED ORDER — LOSARTAN POTASSIUM 50 MG PO TABS: 100.0000 mg | ORAL_TABLET | Freq: Every day | ORAL | Status: DC

## 2016-07-28 MED ORDER — METOPROLOL SUCCINATE ER 50 MG PO TB24
50.0000 mg | ORAL_TABLET | Freq: Once | ORAL | Status: AC
  Administered 2016-07-28: 50 mg via ORAL
  Filled 2016-07-28: qty 1

## 2016-07-28 MED ORDER — TAMSULOSIN HCL 0.4 MG PO CAPS
0.4000 mg | ORAL_CAPSULE | Freq: Every day | ORAL | Status: DC
  Administered 2016-07-28 – 2016-07-31 (×4): 0.4 mg via ORAL
  Filled 2016-07-28 (×4): qty 1

## 2016-07-28 MED ORDER — GLUCOSAMINE-CHONDROITIN 500-400 MG PO TABS: 1.0000 | ORAL_TABLET | Freq: Two times a day (BID) | ORAL | Status: DC

## 2016-07-28 MED ORDER — AMIODARONE IV BOLUS ONLY 150 MG/100ML
150.0000 mg | Freq: Once | INTRAVENOUS | Status: AC
  Administered 2016-07-28: 150 mg via INTRAVENOUS

## 2016-07-28 MED ORDER — ALBUTEROL SULFATE (2.5 MG/3ML) 0.083% IN NEBU: 2.5000 mg | INHALATION_SOLUTION | Freq: Every day | RESPIRATORY_TRACT | Status: DC | PRN

## 2016-07-28 MED ORDER — AMIODARONE HCL IN DEXTROSE 360-4.14 MG/200ML-% IV SOLN
60.0000 mg/h | Freq: Once | INTRAVENOUS | Status: AC
  Administered 2016-07-28: 60 mg/h via INTRAVENOUS
  Filled 2016-07-28: qty 200

## 2016-07-28 MED ORDER — OXYMETAZOLINE HCL 0.05 % NA SOLN
1.0000 | Freq: Once | NASAL | Status: AC
  Administered 2016-07-28: 1 via NASAL
  Filled 2016-07-28: qty 15

## 2016-07-28 MED ORDER — SODIUM CHLORIDE 0.9 % IV BOLUS (SEPSIS)
500.0000 mL | Freq: Once | INTRAVENOUS | Status: AC
  Administered 2016-07-28: 500 mL via INTRAVENOUS

## 2016-07-28 MED ORDER — PROCAINAMIDE HCL 100 MG/ML IJ SOLN
1000.0000 mg | Freq: Once | INTRAVENOUS | Status: AC
  Administered 2016-07-28: 1000 mg via INTRAVENOUS
  Filled 2016-07-28: qty 10

## 2016-07-28 MED ORDER — ONDANSETRON HCL 4 MG/2ML IJ SOLN: 4.0000 mg | Freq: Four times a day (QID) | INTRAMUSCULAR | Status: DC | PRN

## 2016-07-28 MED ORDER — MILK THISTLE 175 MG PO TABS: 175.0000 mg | ORAL_TABLET | Freq: Every day | ORAL | Status: DC

## 2016-07-28 MED ORDER — MOMETASONE FURO-FORMOTEROL FUM 100-5 MCG/ACT IN AERO
2.0000 | INHALATION_SPRAY | Freq: Two times a day (BID) | RESPIRATORY_TRACT | Status: DC
  Administered 2016-07-28 – 2016-07-31 (×6): 2 via RESPIRATORY_TRACT
  Filled 2016-07-28: qty 8.8

## 2016-07-28 MED ORDER — METOPROLOL TARTRATE 5 MG/5ML IV SOLN
5.0000 mg | Freq: Once | INTRAVENOUS | Status: AC
  Administered 2016-07-28: 5 mg via INTRAVENOUS
  Filled 2016-07-28: qty 5

## 2016-07-28 MED ORDER — PRAVASTATIN SODIUM 40 MG PO TABS
40.0000 mg | ORAL_TABLET | Freq: Every day | ORAL | Status: DC
  Administered 2016-07-28 – 2016-07-31 (×4): 40 mg via ORAL
  Filled 2016-07-28 (×4): qty 1

## 2016-07-28 MED ORDER — DOFETILIDE 250 MCG PO CAPS: 250.0000 ug | ORAL_CAPSULE | Freq: Two times a day (BID) | ORAL | Status: DC

## 2016-07-28 MED ORDER — SERTRALINE HCL 50 MG PO TABS
50.0000 mg | ORAL_TABLET | Freq: Every day | ORAL | Status: DC
  Administered 2016-07-28 – 2016-07-31 (×4): 50 mg via ORAL
  Filled 2016-07-28 (×5): qty 1

## 2016-07-28 MED ORDER — MAGNESIUM SULFATE 2 GM/50ML IV SOLN
2.0000 g | Freq: Once | INTRAVENOUS | Status: AC
  Administered 2016-07-28: 2 g via INTRAVENOUS
  Filled 2016-07-28: qty 50

## 2016-07-28 MED ORDER — METOPROLOL SUCCINATE ER 25 MG PO TB24
25.0000 mg | ORAL_TABLET | Freq: Every day | ORAL | Status: DC
  Administered 2016-07-28 – 2016-07-31 (×3): 25 mg via ORAL
  Filled 2016-07-28 (×3): qty 1

## 2016-07-28 MED ORDER — LOSARTAN POTASSIUM 50 MG PO TABS
100.0000 mg | ORAL_TABLET | Freq: Every day | ORAL | Status: DC
  Administered 2016-07-29: 100 mg via ORAL
  Filled 2016-07-28: qty 2

## 2016-07-28 MED ORDER — RIVAROXABAN 20 MG PO TABS
20.0000 mg | ORAL_TABLET | Freq: Every day | ORAL | Status: DC
  Administered 2016-07-28 – 2016-07-30 (×3): 20 mg via ORAL
  Filled 2016-07-28 (×3): qty 1

## 2016-07-28 MED ORDER — POTASSIUM CHLORIDE CRYS ER 20 MEQ PO TBCR
20.0000 meq | EXTENDED_RELEASE_TABLET | Freq: Every day | ORAL | Status: DC
  Administered 2016-07-28 – 2016-07-31 (×4): 20 meq via ORAL
  Filled 2016-07-28 (×4): qty 1

## 2016-07-28 MED ORDER — TIOTROPIUM BROMIDE MONOHYDRATE 18 MCG IN CAPS
18.0000 ug | ORAL_CAPSULE | Freq: Every day | RESPIRATORY_TRACT | Status: DC
  Administered 2016-07-28 – 2016-07-31 (×4): 18 ug via RESPIRATORY_TRACT
  Filled 2016-07-28: qty 5

## 2016-07-28 MED ORDER — TRAMADOL HCL 50 MG PO TABS
50.0000 mg | ORAL_TABLET | Freq: Once | ORAL | Status: AC
  Administered 2016-07-28: 50 mg via ORAL
  Filled 2016-07-28: qty 1

## 2016-07-28 MED ORDER — DEXTROSE 5 % IV SOLN: 60.0000 mg/h | Freq: Once | INTRAVENOUS | Status: DC

## 2016-07-28 MED ORDER — ACETAMINOPHEN 325 MG PO TABS
650.0000 mg | ORAL_TABLET | ORAL | Status: DC | PRN
Start: 2016-07-28 — End: 2016-07-31
  Administered 2016-07-28: 650 mg via ORAL
  Filled 2016-07-28 (×2): qty 2

## 2016-07-28 MED ORDER — FOLIC ACID 1 MG PO TABS
500.0000 ug | ORAL_TABLET | Freq: Every day | ORAL | Status: DC
  Administered 2016-07-28 – 2016-07-31 (×4): 0.5 mg via ORAL
  Filled 2016-07-28 (×4): qty 1

## 2016-07-28 MED ORDER — METOPROLOL TARTRATE 25 MG PO TABS
25.0000 mg | ORAL_TABLET | Freq: Once | ORAL | Status: AC
  Administered 2016-07-28: 25 mg via ORAL
  Filled 2016-07-28: qty 1

## 2016-07-28 MED ORDER — DILTIAZEM HCL 100 MG IV SOLR
5.0000 mg/h | INTRAVENOUS | Status: DC
  Administered 2016-07-28: 5 mg/h via INTRAVENOUS
  Filled 2016-07-28 (×2): qty 100

## 2016-07-28 MED ORDER — AMIODARONE IV BOLUS ONLY 150 MG/100ML
INTRAVENOUS | Status: AC
  Filled 2016-07-28: qty 100

## 2016-07-28 NOTE — Progress Notes (Signed)
[place] Dr. Nehemiah Massed notified of continued high heart and rate and runs of wide QRS beats. Also reported EKG showed Atrial Flutter.

## 2016-07-28 NOTE — ED Notes (Signed)
Called report to [place] in CCU.  Pt prepared for transport to floor.

## 2016-07-28 NOTE — ED Provider Notes (Signed)
-----------------------------------------   11:30 AM on 07/28/2016 -----------------------------------------   Blood pressure 110/82, pulse (!) 151, temperature 97.4 F (36.3 C), temperature source Axillary, resp. rate (!) 25, height 5\' 9"  (1.753 m), weight 170 lb (77.1 kg), SpO2 93 %.  Assuming care from Dr. Dahlia Client of Logan Morgan is a 76 y.o. male with a chief complaint of Epistaxis .    Patient admitted for runs of Vtach by Dr. Dahlia Client however patient was still in the emergency department during my shift. He was stable for the first 4 hours will normal sinus rhythm on an amiodarone drip. Patient then started having runs of V. tach again. Normal mental status and blood pressure. I spoke again with Dr. Nehemiah Massed who recommended to give another 2 g of magnesium and stopped the amiodarone. He recommended 25 mg of by mouth metoprolol. He is aware of patient's rhythm and will come to the emergency Department shortly to evaluate him. Patient has been updated.  _________________________ 1:38 PM on 07/28/2016 -----------------------------------------  Patient no longer having runs of Vtach. Now in afib with RVR. PO metoprolol not controlling rate. Dr. Nehemiah Massed saw patient and left a consult note recommending low dose metoprolol. I paged him twice to discuss IV metoprolol since PO is not controlling his rate. Still waiting to hear back from him. Will give a dose of IV 5mg  metoprolol as patient's HR has been 140-170s.   ED ECG REPORT I, Rudene Re, the attending physician, personally viewed and interpreted this ECG.   10:50: Atrial fibrillation and with frequent runs of V. tach, rate of 156, prolonged QTC at 605, left axis deviation, no ST elevation.  11:13: Atrial fibrillation, rate of 92, normal QTC, normal axis, no ST elevations or depressions.   Rudene Re, MD 07/29/16 2223

## 2016-07-28 NOTE — Consult Note (Signed)
[facility] Cardiology Consultation Note  Patient ID: Logan Morgan, MRN: [id number], DOB/AGE: 1940-08-29 76 y.o. Admit date: 07/28/2016   Date of Consult: 07/28/2016 Primary Physician: Tracie Harrier, MD Primary Cardiologist:Fath  Chief Complaint:  Chief Complaint  Patient presents with  . Epistaxis   Reason for Consult: atrial fibrillation with rapid ventricular rate and wide complex tachycardia  HPI: 76 y.o. male with known paroxysmal nonvalvular atrial fibrillation previously on Tikosyn at appropriate dose who is done fairly well with intermittent episodes of palpitations and irregular heartbeat. The patient does cleaned that he is not always in normal sinus rhythm and feels that he has episodes that last several hours at times. The patient has been more controlled in recent months been without the Tikosyn. In January he had an EKG showing sinus rhythm with a QTC of 477 and no other abnormalities. Recently he has had some significant new issue of the nasal bleeding for which she came into the hospital. At that time he had irritation of his nose and nasal cannula was placed. At that time the patient ended up with atrial fibrillation with rapid ventricular rate with some periods of wide complex tachycardia in runs. All of this was relatively asymptomatic. The patient was placed on amiodarone for which did not help his episodes of tachycardia and or wide complex tachycardia. The patient had normal electrolytes with a magnesium of the 2 and a potassium of 4. There was some concerns that the patient may have the medication induced wide complex tachycardia although this did not appear to be torsades in nature. After further evaluation we have the vent concerns of tachycardic induced by medication management and therefore we have discontinued Tikosyn and amiodarone at this time. Lately the patient does have now atrial fibrillation with rapid ventricular rate but resolution of all wide complex  tachycardia. We will continue to monitor and use some heart rate control with beta blocker until fully metabolizing Tikosyn and amiodarone at this time. Clearly the patient has had an echocardiogram showing normal LV systolic function and no evidence of heart failure or previous myocardial infarction as well as a stress to showing no induction of atrial fibrillation and normal myocardial perfusion  Past Medical History:  Diagnosis Date  . COPD (chronic obstructive pulmonary disease) ([place])   . Dysrhythmia       Surgical History: History reviewed. No pertinent surgical history.   Home Meds: Prior to Admission medications   Medication Sig Start Date End Date Taking? Authorizing Provider  albuterol (PROVENTIL HFA;VENTOLIN HFA) 108 (90 Base) MCG/ACT inhaler Inhale 2 puffs into the lungs daily as needed for wheezing or shortness of breath.   Yes Historical Provider, MD  dofetilide (TIKOSYN) 250 MCG capsule Take 1 capsule (250 mcg total) by mouth 2 (two) times daily. 10/26/14  Yes Teodoro Spray, MD  folic acid (FOLVITE) Q000111Q MCG tablet Take 400 mcg by mouth daily.   Yes Historical Provider, MD  glucosamine-chondroitin 500-400 MG tablet Take 1 tablet by mouth 2 (two) times daily.    Yes Historical Provider, MD  losartan (COZAAR) 100 MG tablet Take 100 mg by mouth daily.   Yes Historical Provider, MD  metoprolol succinate (TOPROL-XL) 25 MG 24 hr tablet Take 25 mg by mouth daily.   Yes Historical Provider, MD  milk thistle 175 MG tablet Take 175 mg by mouth daily.   Yes Historical Provider, MD  Mometasone Furo-Formoterol Fum (DULERA IN) Inhale 2 puffs into the lungs daily as needed.   Yes Historical Provider,  MD  mometasone-formoterol (DULERA) 100-5 MCG/ACT AERO Inhale into the lungs. 07/08/15 07/28/17 Yes Historical Provider, MD  Multiple Vitamin (ONE-A-DAY MENS PO) Take 1 tablet by mouth daily.   Yes Historical Provider, MD  potassium chloride SA (K-DUR,KLOR-CON) 20 MEQ tablet Take 1 tablet (20 mEq  total) by mouth daily. 10/26/14  Yes Teodoro Spray, MD  pravastatin (PRAVACHOL) 40 MG tablet Take 40 mg by mouth daily.   Yes Historical Provider, MD  rivaroxaban (XARELTO) 20 MG TABS tablet Take 20 mg by mouth daily with supper.   Yes Historical Provider, MD  sertraline (ZOLOFT) 50 MG tablet TAKE 1 TABLET ONCE DAILY 05/12/16  Yes Historical Provider, MD  tamsulosin (FLOMAX) 0.4 MG CAPS capsule Take 0.4 mg by mouth daily.   Yes Historical Provider, MD  tiotropium (SPIRIVA) 18 MCG inhalation capsule Place 18 mcg into inhaler and inhale daily.   Yes Historical Provider, MD  cephALEXin (KEFLEX) 500 MG capsule Take 1 capsule (500 mg total) by mouth 3 (three) times daily. Patient not taking: Reported on 07/02/2016 05/30/15   Paulette Blanch, MD    Inpatient Medications:  . procainamide (PRONESTYL) BOLUS IVPB  1,000 mg Intravenous Once     Allergies: No Known Allergies  Social History   Social History  . Marital status: Married    Spouse name: N/A  . Number of children: N/A  . Years of education: N/A   Occupational History  . Not on file.   Social History Main Topics  . Smoking status: Former Research scientist (life sciences)  . Smokeless tobacco: Never Used  . Alcohol use Yes     Comment: vodka daily  . Drug use: No  . Sexual activity: Not on file   Other Topics Concern  . Not on file   Social History Narrative  . No narrative on file     Family History  Problem Relation Age of Onset  . COPD Mother      Review of Systems Positive forNosebleeds Negative for: General:  chills, fever, night sweats or weight changes.  Cardiovascular: PND orthopnea syncope dizziness  Dermatological skin lesions rashes Respiratory: Cough congestion Urologic: Frequent urination urination at night and hematuria Abdominal: negative for nausea, vomiting, diarrhea, bright red blood per rectum, melena, or hematemesis Neurologic: negative for visual changes, and/or hearing changes  All other systems reviewed and are otherwise  negative except as noted above.  Labs:  Recent Labs  07/28/16 0451  TROPONINI <0.03   Lab Results  Component Value Date   WBC 7.5 07/28/2016   HGB 15.7 07/28/2016   HCT 45.5 07/28/2016   MCV 92.9 07/28/2016   PLT 238 07/28/2016    Recent Labs Lab 07/28/16 0451  NA 139  K 4.1  CL 109  CO2 21*  BUN 16  CREATININE 0.79  CALCIUM 9.2  PROT 6.9  BILITOT 0.7  ALKPHOS 39  ALT 33  AST 38  GLUCOSE 89   No results found for: CHOL, HDL, LDLCALC, TRIG No results found for: DDIMER  Radiology/Studies:  No results found.  [id number] with rapid ventricular rate  Weights: Filed Weights   07/28/16 0200  Weight: 77.1 kg (170 lb)     Physical Exam: Blood pressure (!) 125/91, pulse (!) 49, temperature 97.4 F (36.3 C), temperature source Axillary, resp. rate 15, height 5\' 9"  (1.753 m), weight 77.1 kg (170 lb), SpO2 94 %. Body mass index is 25.1 kg/m. General: Well developed, well nourished, in no acute distress. Head eyes ears nose throat: Normocephalic, atraumatic, sclera  non-icteric, no xanthomas, nares are without discharge. No apparent thyromegaly and/or mass  Lungs: Normal respiratory effort.  no wheezes, no rales, no rhonchi.  Heart: RRR with normal S1 S2. no murmur gallop, no rub, PMI is normal size and placement, carotid upstroke normal without bruit, jugular venous pressure is normal Abdomen: Soft, non-tender, non-distended with normoactive bowel sounds. No hepatomegaly. No rebound/guarding. No obvious abdominal masses. Abdominal aorta is normal size without bruit Extremities: No edema. no cyanosis, no clubbing, no ulcers  Peripheral : 2+ bilateral upper extremity pulses, 2+ bilateral femoral pulses, 2+ bilateral dorsal pedal pulse Neuro: Alert and oriented. No facial asymmetry. No focal deficit. Moves all extremities spontaneously. Musculoskeletal: Normal muscle tone without kyphosis Psych:  Responds to questions appropriately with a normal affect.     Assessment: 76 year old male with the atrial fibrillation with rapid ventricular rate and wide complex tachycardia possibly induced by the medication management with no current evidence of structural or cardiac ischemia  Plan: 1. Discontinuation of Tikosyn and amiodarone at this time due to concerns of long QT interval tachycardia and/or induction of ventricular tachycardia  2. Magnesium supplementation with intravenous magnesium X 3. Continue to keep potassium level above 4.0 4. Low-dose beta blocker therapy for better heart rate control of atrial fibrillation until further ability to treat above 5. Continue anti-coagulation for further risk reduction in stroke with atrial fibrillation 6. Continue packing of the nose for nosebleed  Signed, Corey Skains M.D. [facility] 07/28/2016, 12:59 PM

## 2016-07-28 NOTE — H&P (Addendum)
[place] [facility] [place] NAME: Logan Morgan    MR#:  [id number]  DATE OF BIRTH:  02-24-1941  DATE OF ADMISSION:  07/28/2016  PRIMARY CARE PHYSICIAN: Tracie Harrier, MD   REQUESTING/REFERRING PHYSICIAN: Loney Hering, MD  CHIEF COMPLAINT:   Chief Complaint  Patient presents with  . Epistaxis    HISTORY OF PRESENT ILLNESS:  Logan Morgan  is a 76 y.o. male with a known history of HTN, Paroxysmal A.fib, COPD being admitted for Ventricular tachyarrhythmias. Patient has been going through lots of stress at home. Had rectal bleed few weeks ago for which his Xarelto was on held. Restarted last week but started having nose bleeds last night. Didn't stop so decided to come to ED. While in ED he was found to be in rapid a.fib. Nose was packed and bleeding stopped but he started having ventricular tachyarrythmias. So he is being admitted for further eval and mgmt.  PAST MEDICAL HISTORY:   Past Medical History:  Diagnosis Date  . COPD (chronic obstructive pulmonary disease) ([place])   . Dysrhythmia   Paroxysmal A.fib HTN CA Prostate Hyperlipidemia PAST SURGICAL HISTORY:  History reviewed. No pertinent surgical history. SOCIAL HISTORY:   Social History  Substance Use Topics  . Smoking status: Former Research scientist (life sciences)  . Smokeless tobacco: Never Used  . Alcohol use Yes     Comment: vodka daily   FAMILY HISTORY:   Family History  Problem Relation Age of Onset  . COPD Mother    DRUG ALLERGIES:  No Known Allergies  REVIEW OF SYSTEMS:   Review of Systems  Constitutional: Negative for chills, fever and weight loss.  HENT: Negative for nosebleeds and sore throat.   Eyes: Negative for blurred vision.  Respiratory: Negative for cough, shortness of breath and wheezing.   Cardiovascular: Positive for palpitations. Negative for chest pain, orthopnea, leg swelling and PND.  Gastrointestinal: Negative for abdominal pain, constipation, diarrhea,  heartburn, nausea and vomiting.  Genitourinary: Negative for dysuria and urgency.  Musculoskeletal: Negative for back pain.  Skin: Negative for rash.  Neurological: Negative for dizziness, speech change, focal weakness and headaches.  Endo/Heme/Allergies: Does not bruise/bleed easily.  Psychiatric/Behavioral: Negative for depression.   MEDICATIONS AT HOME:   Prior to Admission medications   Medication Sig Start Date End Date Taking? Authorizing Provider  albuterol (PROVENTIL HFA;VENTOLIN HFA) 108 (90 Base) MCG/ACT inhaler Inhale 2 puffs into the lungs daily as needed for wheezing or shortness of breath.   Yes Historical Provider, MD  dofetilide (TIKOSYN) 250 MCG capsule Take 1 capsule (250 mcg total) by mouth 2 (two) times daily. 10/26/14  Yes Teodoro Spray, MD  folic acid (FOLVITE) Q000111Q MCG tablet Take 400 mcg by mouth daily.   Yes Historical Provider, MD  glucosamine-chondroitin 500-400 MG tablet Take 1 tablet by mouth 2 (two) times daily.    Yes Historical Provider, MD  losartan (COZAAR) 100 MG tablet Take 100 mg by mouth daily.   Yes Historical Provider, MD  metoprolol succinate (TOPROL-XL) 25 MG 24 hr tablet Take 25 mg by mouth daily.   Yes Historical Provider, MD  milk thistle 175 MG tablet Take 175 mg by mouth daily.   Yes Historical Provider, MD  Mometasone Furo-Formoterol Fum (DULERA IN) Inhale 2 puffs into the lungs daily as needed.   Yes Historical Provider, MD  mometasone-formoterol (DULERA) 100-5 MCG/ACT AERO Inhale into the lungs. 07/08/15 07/28/17 Yes Historical Provider, MD  Multiple Vitamin (ONE-A-DAY MENS PO) Take 1 tablet  by mouth daily.   Yes Historical Provider, MD  potassium chloride SA (K-DUR,KLOR-CON) 20 MEQ tablet Take 1 tablet (20 mEq total) by mouth daily. 10/26/14  Yes Teodoro Spray, MD  pravastatin (PRAVACHOL) 40 MG tablet Take 40 mg by mouth daily.   Yes Historical Provider, MD  rivaroxaban (XARELTO) 20 MG TABS tablet Take 20 mg by mouth daily with supper.   Yes  Historical Provider, MD  sertraline (ZOLOFT) 50 MG tablet TAKE 1 TABLET ONCE DAILY 05/12/16  Yes Historical Provider, MD  tamsulosin (FLOMAX) 0.4 MG CAPS capsule Take 0.4 mg by mouth daily.   Yes Historical Provider, MD  tiotropium (SPIRIVA) 18 MCG inhalation capsule Place 18 mcg into inhaler and inhale daily.   Yes Historical Provider, MD  cephALEXin (KEFLEX) 500 MG capsule Take 1 capsule (500 mg total) by mouth 3 (three) times daily. Patient not taking: Reported on 07/02/2016 05/30/15   Paulette Blanch, MD      VITAL SIGNS:  Blood pressure 105/85, pulse 67, temperature 97.4 F (36.3 C), temperature source Axillary, resp. rate 14, height 5\' 9"  (1.753 m), weight 77.1 kg (170 lb), SpO2 92 %.  PHYSICAL EXAMINATION:  Physical Exam  Constitutional: He is oriented to person, place, and time and well-developed, well-nourished, and in no distress.  HENT:  Head: Normocephalic and atraumatic.  Eyes: Conjunctivae and EOM are normal. Pupils are equal, round, and reactive to light.  Neck: Normal range of motion. Neck supple. No tracheal deviation present. No thyromegaly present.  Cardiovascular: Normal heart sounds.  An irregularly irregular rhythm present. Tachycardia present.   Pulmonary/Chest: Effort normal and breath sounds normal. No respiratory distress. He has no wheezes. He exhibits no tenderness.  Abdominal: Soft. Bowel sounds are normal. He exhibits no distension. There is no tenderness.  Musculoskeletal: Normal range of motion.  Neurological: He is alert and oriented to person, place, and time. No cranial nerve deficit.  Skin: Skin is warm and dry. No rash noted.  Psychiatric: Mood and affect normal.   LABORATORY PANEL:   CBC  Recent Labs Lab 07/28/16 0451  WBC 7.5  HGB 15.7  HCT 45.5  PLT 238   ------------------------------------------------------------------------------------------------------------------  Chemistries   Recent Labs Lab 07/28/16 0451  NA 139  K 4.1  CL 109   CO2 21*  GLUCOSE 89  BUN 16  CREATININE 0.79  CALCIUM 9.2  MG 2.0  AST 38  ALT 33  ALKPHOS 39  BILITOT 0.7   ------------------------------------------------------------------------------------------------------------------  Cardiac Enzymes  Recent Labs Lab 07/28/16 0451  TROPONINI <0.03   ------------------------------------------------------------------------------------------------------------------  RADIOLOGY:  No results found.  IMPRESSION AND PLAN:  44 m with ventricular tachyarrhythmia  * V.tech/v.fib - amio bolus given in ED - cardio c/s - d/w Dr Nehemiah Massed - he prefers wash out amiodarone and try b-blocker - tele monitor - Keep K close to 4 and Mg close to 2  * HTN - BP control  * Paroxysmal A.fib: - Rate remains uncontrolled - d/w cardio - may consider B-blocker and wash out amio,tikosyn - cardio would like to restart xarelto  * rectal bleed - thought to be due to hemorrhoids - hold Xarelto  * COPD - stable - monitor     All the records are reviewed and case discussed with ED provider. Management plans discussed with the patient, family and they are in agreement.  CODE STATUS: FULL CODE  TOTAL TIME TAKING CARE OF THIS PATIENT: 45 minutes.    Max Sane M.D on 07/28/2016 at 7:55 AM  Between 7am to 6pm - Pager - [phone]  After 6pm go to [email or web address] - Proofreader  [facility]  Office  [phone]  CC: Primary care physician; Tracie Harrier, MD   Note: This dictation was prepared with Dragon dictation along with smaller phrase technology. Any transcriptional errors that result from this process are unintentional.

## 2016-07-28 NOTE — ED Notes (Signed)
Dr veronese at bedside 

## 2016-07-28 NOTE — ED Notes (Signed)
Pt in NSR on monitor, HR 78,  Mag infusing and amiodarone infusing at this time. Pt alert and oriented, skin warm and dry.  Pt denies chest pain.  Pt awaiting admission.  Pt does not that nose appears to be bleeding more, although this nurse does not see any blood coming from pt nose at this time.

## 2016-07-28 NOTE — ED Notes (Signed)
Spoke with dr Manuella Ghazi about pt bed placement.  Pt HR continues in 160's, bed changed to step down.

## 2016-07-28 NOTE — ED Provider Notes (Signed)
[facility] Emergency Department Provider Note   ____________________________________________   First MD Initiated Contact with Patient 07/28/16 0424     (approximate)  I have reviewed the triage vital signs and the nursing notes.   HISTORY  Chief Complaint Epistaxis    HPI Logan Morgan is a 76 y.o. male who comes into the hospital today with a nosebleed. The patient reports that it started on 11 PM. He had taken a shower earlier in the morning and noticed some bleeding from his nose. It had stopped but then started back again in the evening. He reports that it didn't matter how much he held that he could not stop it. He states that the bleeding is coming from the left side. He had a nosebleed about a year ago where he had to receive some nasal packing but hasn't had any problems since then. The patient is on Xarelto and reports that he has had a recent GI bleed from this. He reports that he does not remember the ENT physician he saw last year. The patient denies any pain. He doesn't feel any blood coming down his throat. He is having no nausea. The patient has A. fib and he reports that he thinks he is in A. fib right now.   Past Medical History:  Diagnosis Date  . COPD (chronic obstructive pulmonary disease) ([place])   . Dysrhythmia     Patient Active Problem List   Diagnosis Date Noted  . A-fib ([place]) 10/24/2014  . AF (paroxysmal atrial fibrillation) ([place]) 08/13/2014  . History of tachycardia 08/06/2014  . 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 Essential (primary) hypertension 06/13/2014  . Cancer of prostate ([place]) 06/13/2014  . Mixed hyperlipidemia 06/13/2014    History reviewed. No pertinent surgical history.  Prior to Admission medications   Medication Sig Start Date End Date Taking? Authorizing Provider  albuterol (PROVENTIL HFA;VENTOLIN HFA) 108 (90 Base) MCG/ACT inhaler Inhale 2 puffs into the lungs daily as needed for  wheezing or shortness of breath.   Yes Historical Provider, MD  dofetilide (TIKOSYN) 250 MCG capsule Take 1 capsule (250 mcg total) by mouth 2 (two) times daily. 10/26/14  Yes Teodoro Spray, MD  folic acid (FOLVITE) Q000111Q MCG tablet Take 400 mcg by mouth daily.   Yes Historical Provider, MD  glucosamine-chondroitin 500-400 MG tablet Take 1 tablet by mouth 2 (two) times daily.    Yes Historical Provider, MD  losartan (COZAAR) 100 MG tablet Take 100 mg by mouth daily.   Yes Historical Provider, MD  metoprolol succinate (TOPROL-XL) 25 MG 24 hr tablet Take 25 mg by mouth daily.   Yes Historical Provider, MD  milk thistle 175 MG tablet Take 175 mg by mouth daily.   Yes Historical Provider, MD  Mometasone Furo-Formoterol Fum (DULERA IN) Inhale 2 puffs into the lungs daily as needed.   Yes Historical Provider, MD  mometasone-formoterol (DULERA) 100-5 MCG/ACT AERO Inhale into the lungs. 07/08/15 07/28/17 Yes Historical Provider, MD  Multiple Vitamin (ONE-A-DAY MENS PO) Take 1 tablet by mouth daily.   Yes Historical Provider, MD  potassium chloride SA (K-DUR,KLOR-CON) 20 MEQ tablet Take 1 tablet (20 mEq total) by mouth daily. 10/26/14  Yes Teodoro Spray, MD  pravastatin (PRAVACHOL) 40 MG tablet Take 40 mg by mouth daily.   Yes Historical Provider, MD  rivaroxaban (XARELTO) 20 MG TABS tablet Take 20 mg by mouth daily with supper.   Yes Historical Provider, MD  sertraline (ZOLOFT) 50 MG  tablet TAKE 1 TABLET ONCE DAILY 05/12/16  Yes Historical Provider, MD  tamsulosin (FLOMAX) 0.4 MG CAPS capsule Take 0.4 mg by mouth daily.   Yes Historical Provider, MD  tiotropium (SPIRIVA) 18 MCG inhalation capsule Place 18 mcg into inhaler and inhale daily.   Yes Historical Provider, MD  cephALEXin (KEFLEX) 500 MG capsule Take 1 capsule (500 mg total) by mouth 3 (three) times daily. Patient not taking: Reported on 07/02/2016 05/30/15   Paulette Blanch, MD    Allergies Patient has no known allergies.  Family History  Problem  Relation Age of Onset  . COPD Mother     Social History Social History  Substance Use Topics  . Smoking status: Former Research scientist (life sciences)  . Smokeless tobacco: Never Used  . Alcohol use Yes     Comment: vodka daily    Review of Systems Constitutional: No fever/chills Eyes: No visual changes. ENT: Nose bleed Cardiovascular: Denies chest pain. Respiratory: Denies shortness of breath. Gastrointestinal: No abdominal pain.  No nausea, no vomiting.  No diarrhea.  No constipation. Genitourinary: Negative for dysuria. Musculoskeletal: Negative for back pain. Skin: Negative for rash. Neurological: Negative for headaches, focal weakness or numbness.  10-point ROS otherwise negative.  ____________________________________________   PHYSICAL EXAM:  VITAL SIGNS: ED Triage Vitals  Enc Vitals Group     BP 07/28/16 0157 (!) 143/77     Pulse Rate 07/28/16 0157 73     Resp 07/28/16 0157 18     Temp 07/28/16 0157 97.4 F (36.3 C)     Temp Source 07/28/16 0157 Axillary     SpO2 07/28/16 0157 94 %     Weight 07/28/16 0200 170 lb (77.1 kg)     Height 07/28/16 0200 5\' 9"  (1.753 m)     Head Circumference --      Peak Flow --      Pain Score 07/28/16 0203 0     Pain Loc --      Pain Edu? --      Excl. in [place]? --     Constitutional: Alert and oriented. Well appearing and in mild distress. Eyes: Conjunctivae are normal. PERRL. EOMI. Head: Atraumatic. Nose: bleeding from left nostril Mouth/Throat: Mucous membranes are moist.  Oropharynx non-erythematous. Cardiovascular: Tachycardia, regular rhythm. Grossly normal heart sounds.  Good peripheral circulation. Respiratory: Normal respiratory effort.  No retractions. Lungs CTAB. Gastrointestinal: Soft and nontender. No distention. Positive bowel sounds Musculoskeletal: No lower extremity tenderness nor edema.   Neurologic:  Normal speech and language.  Skin:  Skin is warm, dry and intact.  Psychiatric: Mood and affect are  normal.  ____________________________________________   LABS (all labs ordered are listed, but only abnormal results are displayed)  Labs Reviewed  COMPREHENSIVE METABOLIC PANEL - Abnormal; Notable for the following:       Result Value   CO2 21 (*)    All other components within normal limits  PROTIME-INR - Abnormal; Notable for the following:    Prothrombin Time 24.5 (*)    All other components within normal limits  APTT - Abnormal; Notable for the following:    aPTT 44 (*)    All other components within normal limits  CBC  TROPONIN I  MAGNESIUM   ____________________________________________  EKG  ED ECG REPORT I, Loney Hering, the attending physician, personally viewed and interpreted this ECG.   Date: 07/28/2016  EKG Time: 452  Rate: 158  Rhythm: atrial fibrillation, rate 158  Axis: normal  Intervals:prologed qtc  ST&T  Change: ST depressoin in leads II, III,V4. V5, V6  ____________________________________________  RADIOLOGY  none ____________________________________________   PROCEDURES  Procedure(s) performed: please, see procedure note(s).  .Epistaxis Management Date/Time: 07/28/2016 4:50 AM Performed by: Loney Hering Authorized by: Loney Hering   Consent:    Consent obtained:  Verbal   Consent given by:  Patient Anesthesia (see MAR for exact dosages):    Anesthesia method:  None Procedure details:    Treatment site:  Unable to specify   Treatment method:  Merocel sponge Post-procedure details:    Assessment:  Bleeding stopped   Patient tolerance of procedure:  Tolerated well, no immediate complications    Critical Care performed: Yes, see critical care note(s)  CRITICAL CARE Performed by: Charlesetta Ivory P   Total critical care time: 75 minutes  Critical care time was exclusive of separately billable procedures and treating other patients.  Critical care was necessary to treat or prevent imminent or  life-threatening deterioration.  Critical care was time spent personally by me on the following activities: development of treatment plan with patient and/or surrogate as well as nursing, discussions with consultants, evaluation of patient's response to treatment, examination of patient, obtaining history from patient or surrogate, ordering and performing treatments and interventions, ordering and review of laboratory studies, ordering and review of radiographic studies, pulse oximetry and re-evaluation of patient's condition.  ____________________________________________   INITIAL IMPRESSION / ASSESSMENT AND PLAN / ED COURSE  Pertinent labs & imaging results that were available during my care of the patient were reviewed by me and considered in my medical decision making (see chart for details).  This is a 76 year old male who comes into the hospital today with a nosebleed. I did pack the patient's nose with a sponge. The bleeding was controlled but when I examine the patient he sounded tachycardic. The patient also said that he felt he was in A. fib at the time. I had the nurse put the patient on the monitor and check an EKG. When we did evaluate the patient his heart rate was in the 170s to 180s and he was in atrial fibrillation with rapid ventricular response. The patient also had some runs of ventricular tachycardia. Given the patient's rate and his arrhythmia I decided to place the patient on an amiodarone drip. He received 150 mg IV 1 dose and then was started at 60 mg hourly. I added a magnesium to the patient's blood work and also gave him some normal saline. His heart rate had some mild improvement but he was still in atrial fibrillation with rapid ventricular response and he was also having many runs in beats of ventricular tachycardia. I did review the patient's chart and noticed that he was on dofetilide which can prolong the QTC. The decision was made to order magnesium sulfate for the  patient. I also contacted Dr. Nehemiah Massed who also recommended giving the patient magnesium sulfate and potassium as needed. The patient did have moments where his heart rate would improve but then the arrhythmia would return. After some time the patient did receive a magnesium sulfate and his heart rate converted into normal sinus rhythm. I will admit the patient to the hospitalist service for further evaluation of his arrhythmia and his atrial fibrillation with rapid ventricular response. He remains on an amiodarone drip at this time.       ____________________________________________   FINAL CLINICAL IMPRESSION(S) / ED DIAGNOSES  Final diagnoses:  Epistaxis  Atrial fibrillation with rapid ventricular response (HCC)  Ventricular tachycardia ([place])      NEW MEDICATIONS STARTED DURING THIS VISIT:  New Prescriptions   No medications on file     Note:  This document was prepared using Dragon voice recognition software and may include unintentional dictation errors.    Loney Hering, MD 07/28/16 [phone]

## 2016-07-28 NOTE — Progress Notes (Signed)
PHARMACIST - PHYSICIAN ORDER COMMUNICATION  CONCERNING: P&T Medication Policy on Herbal Medications  DESCRIPTION:  This patient's order for:  Glucosamine-chondroitin ,  Milk Thistle  has been noted.  This product(s) is classified as an "herbal" or natural product. Due to a lack of definitive safety studies or FDA approval, nonstandard manufacturing practices, plus the potential risk of unknown drug-drug interactions while on inpatient medications, the Pharmacy and Therapeutics Committee does not permit the use of "herbal" or natural products of this type within [facility].   ACTION TAKEN: The pharmacy department is unable to verify this order at this time and your patient has been informed of this safety policy. Please reevaluate patient's clinical condition at discharge and address if the herbal or natural product(s) should be resumed at that time.

## 2016-07-28 NOTE — ED Triage Notes (Addendum)
Pt has hx of nose bleeds and tonight was watching tv and his nose started bleeding - no other symptoms - pt is on xarelto - pt states he drinks vodka daily - at this time bleeding is controlled with nose clamp

## 2016-07-28 NOTE — ED Notes (Signed)
Dr kowalski at bedside

## 2016-07-29 LAB — BASIC METABOLIC PANEL
Anion gap: 4 — ABNORMAL LOW (ref 5–15)
BUN: 16 mg/dL (ref 6–20)
CALCIUM: 8.3 mg/dL — AB (ref 8.9–10.3)
CO2: 23 mmol/L (ref 22–32)
CREATININE: 0.91 mg/dL (ref 0.61–1.24)
Chloride: 106 mmol/L (ref 101–111)
GFR calc Af Amer: >60 mL/min (ref >60)
GLUCOSE: 100 mg/dL — AB (ref 65–99)
Potassium: 4.1 mmol/L (ref 3.5–5.1)
Sodium: 133 mmol/L — ABNORMAL LOW (ref 135–145)

## 2016-07-29 LAB — CBC
HEMATOCRIT: 39.2 % — AB (ref 40.0–52.0)
HEMOGLOBIN: 13.9 g/dL (ref 13.0–18.0)
MCH: 32.6 pg (ref 26.0–34.0)
MCHC: 35.5 g/dL (ref 32.0–36.0)
MCV: 92 fL (ref 80.0–100.0)
Platelets: 223 10*3/uL (ref 150–440)
RBC: 4.25 MIL/uL — AB (ref 4.40–5.90)
RDW: 13.4 % (ref 11.5–14.5)
WBC: 8.3 10*3/uL (ref 3.8–10.6)

## 2016-07-29 LAB — PROTIME-INR
INR: 1.95
Prothrombin Time: 22.5 seconds — ABNORMAL HIGH (ref 11.4–15.2)

## 2016-07-29 LAB — LIPID PANEL
Cholesterol: 165 mg/dL (ref 0–200)
HDL: 51 mg/dL (ref >40)
LDL Cholesterol: 93 mg/dL (ref 0–99)
Total CHOL/HDL Ratio: 3.2 RATIO
Triglycerides: 103 mg/dL (ref <150)
VLDL: 21 mg/dL (ref 0–40)

## 2016-07-29 MED ORDER — ALPRAZOLAM 0.25 MG PO TABS
0.2500 mg | ORAL_TABLET | Freq: Once | ORAL | Status: AC
  Administered 2016-07-29: 0.25 mg via ORAL
  Filled 2016-07-29: qty 1

## 2016-07-29 MED ORDER — SODIUM CHLORIDE 0.9 % IV SOLN: 250.0000 mL | INTRAVENOUS | Status: DC

## 2016-07-29 MED ORDER — SODIUM CHLORIDE 0.9% FLUSH: 3.0000 mL | INTRAVENOUS | Status: DC | PRN

## 2016-07-29 MED ORDER — SODIUM CHLORIDE 0.9 % IV SOLN: INTRAVENOUS | Status: DC

## 2016-07-29 MED ORDER — SODIUM CHLORIDE 0.9% FLUSH
3.0000 mL | Freq: Two times a day (BID) | INTRAVENOUS | Status: DC
  Administered 2016-07-29 – 2016-07-31 (×4): 3 mL via INTRAVENOUS

## 2016-07-29 NOTE — H&P (Deleted)
[place] [facility] [place] NAME: Logan Morgan    MR#:  [id number]  DATE OF BIRTH:  [birth year]  DATE OF ADMISSION:  07/28/2016  PRIMARY CARE PHYSICIAN: Tracie Harrier, MD   REQUESTING/REFERRING PHYSICIAN: Loney Hering, MD  CHIEF COMPLAINT:  Pt denies any complaints he says that he is unaware that he is going to have a cardioversion as I see in cardiology note. He is nothing by mouth, requesting to talk to cardiologist.    Chief Complaint  Patient presents with  . Epistaxis    HISTORY OF PRESENT ILLNESS:  Logan Morgan  is a 76 y.o. male with a known history of HTN, Paroxysmal A.fib, COPD being admitted for Ventricular tachyarrhythmias. Patient has been going through lots of stress at home. Had rectal bleed few weeks ago for which his Xarelto was on held. Restarted last week but started having nose bleeds last night. Didn't stop so decided to come to ED. While in ED he was found to be in rapid a.fib. Nose was packed and bleeding stopped but he started having ventricular tachyarrythmias. So he is being admitted for further eval and mgmt.  PAST MEDICAL HISTORY:   Past Medical History:  Diagnosis Date  . COPD (chronic obstructive pulmonary disease) ([place])   . Dysrhythmia   Paroxysmal A.fib HTN CA Prostate Hyperlipidemia PAST SURGICAL HISTORY:  History reviewed. No pertinent surgical history. SOCIAL HISTORY:   Social History  Substance Use Topics  . Smoking status: Former Research scientist (life sciences)  . Smokeless tobacco: Never Used  . Alcohol use Yes     Comment: vodka daily   FAMILY HISTORY:   Family History  Problem Relation Age of Onset  . COPD Mother    DRUG ALLERGIES:  No Known Allergies  REVIEW OF SYSTEMS:   Review of Systems  Constitutional: Negative for chills, fever and weight loss.  HENT: Negative for nosebleeds and sore throat.   Eyes: Negative for blurred vision.  Respiratory: Negative for cough, shortness of breath and wheezing.     Cardiovascular: Positive for palpitations. Negative for chest pain, orthopnea, leg swelling and PND.  Gastrointestinal: Negative for abdominal pain, constipation, diarrhea, heartburn, nausea and vomiting.  Genitourinary: Negative for dysuria and urgency.  Musculoskeletal: Negative for back pain.  Skin: Negative for rash.  Neurological: Negative for dizziness, speech change, focal weakness and headaches.  Endo/Heme/Allergies: Does not bruise/bleed easily.  Psychiatric/Behavioral: Negative for depression.   MEDICATIONS AT HOME:   Prior to Admission medications   Medication Sig Start Date End Date Taking? Authorizing Provider  albuterol (PROVENTIL HFA;VENTOLIN HFA) 108 (90 Base) MCG/ACT inhaler Inhale 2 puffs into the lungs daily as needed for wheezing or shortness of breath.   Yes Historical Provider, MD  dofetilide (TIKOSYN) 250 MCG capsule Take 1 capsule (250 mcg total) by mouth 2 (two) times daily. 10/26/14  Yes Teodoro Spray, MD  folic acid (FOLVITE) Q000111Q MCG tablet Take 400 mcg by mouth daily.   Yes Historical Provider, MD  glucosamine-chondroitin 500-400 MG tablet Take 1 tablet by mouth 2 (two) times daily.    Yes Historical Provider, MD  losartan (COZAAR) 100 MG tablet Take 100 mg by mouth daily.   Yes Historical Provider, MD  metoprolol succinate (TOPROL-XL) 25 MG 24 hr tablet Take 25 mg by mouth daily.   Yes Historical Provider, MD  milk thistle 175 MG tablet Take 175 mg by mouth daily.   Yes Historical Provider, MD  Mometasone Furo-Formoterol Fum (DULERA IN) Inhale 2 puffs  into the lungs daily as needed.   Yes Historical Provider, MD  mometasone-formoterol (DULERA) 100-5 MCG/ACT AERO Inhale into the lungs. 07/08/15 07/28/17 Yes Historical Provider, MD  Multiple Vitamin (ONE-A-DAY MENS PO) Take 1 tablet by mouth daily.   Yes Historical Provider, MD  potassium chloride SA (K-DUR,KLOR-CON) 20 MEQ tablet Take 1 tablet (20 mEq total) by mouth daily. 10/26/14  Yes Teodoro Spray, MD   pravastatin (PRAVACHOL) 40 MG tablet Take 40 mg by mouth daily.   Yes Historical Provider, MD  rivaroxaban (XARELTO) 20 MG TABS tablet Take 20 mg by mouth daily with supper.   Yes Historical Provider, MD  sertraline (ZOLOFT) 50 MG tablet TAKE 1 TABLET ONCE DAILY 05/12/16  Yes Historical Provider, MD  tamsulosin (FLOMAX) 0.4 MG CAPS capsule Take 0.4 mg by mouth daily.   Yes Historical Provider, MD  tiotropium (SPIRIVA) 18 MCG inhalation capsule Place 18 mcg into inhaler and inhale daily.   Yes Historical Provider, MD  cephALEXin (KEFLEX) 500 MG capsule Take 1 capsule (500 mg total) by mouth 3 (three) times daily. Patient not taking: Reported on 07/02/2016 05/30/15   Paulette Blanch, MD      VITAL SIGNS:  Blood pressure 111/81, pulse (!) 110, temperature 97.5 F (36.4 C), temperature source Oral, resp. rate (!) 9, height 5\' 8"  (1.727 m), weight 77.7 kg (171 lb 4.8 oz), SpO2 94 %.  PHYSICAL EXAMINATION:  Physical Exam  Constitutional: He is oriented to person, place, and time and well-developed, well-nourished, and in no distress.  HENT:  Head: Normocephalic and atraumatic.  Eyes: Conjunctivae and EOM are normal. Pupils are equal, round, and reactive to light.  Neck: Normal range of motion. Neck supple. No tracheal deviation present. No thyromegaly present.  Cardiovascular: Normal heart sounds.  An irregularly irregular rhythm present. Tachycardia present.   Pulmonary/Chest: Effort normal and breath sounds normal. No respiratory distress. He has no wheezes. He exhibits no tenderness.  Abdominal: Soft. Bowel sounds are normal. He exhibits no distension. There is no tenderness.  Musculoskeletal: Normal range of motion.  Neurological: He is alert and oriented to person, place, and time. No cranial nerve deficit.  Skin: Skin is warm and dry. No rash noted.  Psychiatric: Mood and affect normal.   LABORATORY PANEL:   CBC  Recent Labs Lab 07/29/16 0537  WBC 8.3  HGB 13.9  HCT 39.2*  PLT 223    ------------------------------------------------------------------------------------------------------------------  Chemistries   Recent Labs Lab 07/28/16 0451 07/29/16 0537  NA 139 133*  K 4.1 4.1  CL 109 106  CO2 21* 23  GLUCOSE 89 100*  BUN 16 16  CREATININE 0.79 0.91  CALCIUM 9.2 8.3*  MG 2.0  --   AST 38  --   ALT 33  --   ALKPHOS 39  --   BILITOT 0.7  --    ------------------------------------------------------------------------------------------------------------------  Cardiac Enzymes  Recent Labs Lab 07/28/16 0451  TROPONINI <0.03   ------------------------------------------------------------------------------------------------------------------  RADIOLOGY:  No results found.  IMPRESSION AND PLAN:  76 year old male patient with a nosebleed found to have atrial fibrillation with RVR, ventricular tachycardia.  Continue metoprolol, anticoagulation, started on amiodarone bolus, plan for cardioversion today as per cardiology note.-    * Paroxysmal A.fib: - Rate remains uncontrolled - d/w cardio - on amiodarone Xarelto, amiodarone.  * rectal bleed - thought to be due to hemorrhoids - * COPD - stable - monitor     All the records are reviewed and case discussed with ED provider. Management plans  discussed with the patient, family and they are in agreement.  CODE STATUS: FULL CODE  TOTAL TIME TAKING CARE OF THIS PATIENT: 45 minutes.    Epifanio Lesches M.D on 07/29/2016 at 2:11 PM  Between 7am to 6pm - Pager - [phone]  After 6pm go to [email or web address] - Proofreader  [facility]  Office  [phone]  CC: Primary care physician; Tracie Harrier, MD   Note: This dictation was prepared with Dragon dictation along with smaller phrase technology. Any transcriptional errors that result from this process are unintentional.

## 2016-07-29 NOTE — Progress Notes (Signed)
Pt has remained alert and oriented with no c/o pain. Lung sounds clear to auscultation. SpO2 >95% on RA. Initially, pt was reviewed to be in AFib/A Flutter upon shift assessment with frequent PVCs and frequent VT runs. Pt converted and has remained in NSR since around 1300. Echo is pending. Pt with orders to transfer to telemetry.

## 2016-07-29 NOTE — Progress Notes (Signed)
[facility] Encounter Note  Patient: Logan Morgan / Admit Date: 07/28/2016 / Date of Encounter: 07/29/2016, 12:25 PM   Subjective: No evidence of chest pain or congestive heart failure throughout his hospitalization. Atrial fibrillation flutter has been better controlled with current medical regimen including metoprolol and diltiazem drip. 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 Patient had long discussion about further treatment of atrial fibrillation with relative failure of Tikosyn and use in the recent past and possible use of amiodarone. This was discussed with Dr. Marcello Moores and Dr. Ubaldo Glassing as well for which she has concurred that amiodarone use is reasonable for arm use to improve ventricular tachycardia occurrences as well as possible maintenance of normal sinus rhythm.  Review of Systems: Positive for: Nose bleed Negative for: Vision change, hearing change, syncope, dizziness, nausea, vomiting,diarrhea, bloody stool, stomach pain, cough, congestion, diaphoresis, urinary frequency, urinary pain,skin lesions, skin rashes Others previously listed  Objective: Telemetry: Atrial fibrillation with variable heart rate Physical Exam: Blood pressure 111/81, pulse (!) 110, temperature 97.5 F (36.4 C), temperature source Oral, resp. rate (!) 9, height 5\' 8"  (1.727 m), weight 77.7 kg (171 lb 4.8 oz), SpO2 94 %. Body mass index is 26.05 kg/m. General: Well developed, well nourished, in no acute distress. Head: Normocephalic, atraumatic, sclera non-icteric, no xanthomas, nares are without discharge. Neck: No apparent masses Lungs: Normal respirations with no wheezes, no rhonchi, no rales , no crackles   Heart: Irregular rate and rhythm, normal S1 S2, no murmur, no rub, no gallop, PMI is normal size and placement, carotid upstroke normal without bruit, jugular  venous pressure normal Abdomen: Soft, non-tender, non-distended with normoactive bowel sounds. No hepatosplenomegaly. Abdominal aorta is normal size without bruit Extremities: No edema, no clubbing, no cyanosis, no ulcers,  Peripheral: 2+ radial, 2+ femoral, 2+ dorsal pedal pulses Neuro: Alert and oriented. Moves all extremities spontaneously. Psych:  Responds to questions appropriately with a normal affect.   Intake/Output Summary (Last 24 hours) at 07/29/16 1225 Last data filed at 07/29/16 0900  Gross per 24 hour  Intake          1275.17 ml  Output              850 ml  Net           425.17 ml    Inpatient Medications:  . folic acid  [id number] mcg Oral Daily  . metoprolol succinate  25 mg Oral Daily  . mometasone-formoterol  2 puff Inhalation BID  . potassium chloride SA  20 mEq Oral Daily  . pravastatin  40 mg Oral Daily  . rivaroxaban  20 mg Oral Q supper  . sertraline  50 mg Oral Daily  . tamsulosin  0.4 mg Oral Daily  . tiotropium  18 mcg Inhalation Daily   Infusions:   Labs:  Recent Labs  07/28/16 0451 07/29/16 0537  NA 139 133*  K 4.1 4.1  CL 109 106  CO2 21* 23  GLUCOSE 89 100*  BUN 16 16  CREATININE 0.79 0.91  CALCIUM 9.2 8.3*  MG 2.0  --     Recent Labs  07/28/16 0451  AST 38  ALT 33  ALKPHOS 39  BILITOT 0.7  PROT 6.9  ALBUMIN 4.3    Recent Labs  07/28/16 0451 07/29/16 0537  WBC 7.5 8.3  HGB 15.7 13.9  HCT 45.5 39.2*  MCV 92.9 92.0  PLT 238  223    Recent Labs  07/28/16 0451  TROPONINI <0.03   Invalid input(s): POCBNP No results for input(s): HGBA1C in the last 72 hours.   Weights: Filed Weights   07/28/16 0200 07/28/16 1440  Weight: 77.1 kg (170 lb) 77.7 kg (171 lb 4.8 oz)     Radiology/Studies:  No results found.   Assessment and Recommendation  76 y.o. male with apparent nosebleed now having atrial fibrillation with rapid ventricular rate and right ventricular outflow tract ventricular tachycardia both relatively  asymptomatic needing further treatment 1. Continue metoprolol for heart rate control of atrial fibrillation for goal heart rate between 60 and 90 bpm 2. Continue anticoagulation for further risk reduction in stroke with atrial fibrillation 3. Addition of amiodarone drip for loading to reduce amount of ventricular tachycardia and begin treatment of atrial fibrillation in preparation for cardioversion 4. Consider echocardiogram for LV systolic dysfunction valvular heart disease contributing to above 5. Proceed to cardioversion of atrial fibrillation after loading of amiodarone. Patient understands risk and benefits of cardiac cardioversion with risk including the possibility of death stroke heart attack other rhythm disturbances and/or side effects of medication management  Signed, Serafina Royals M.D. FACC

## 2016-07-29 NOTE — Progress Notes (Signed)
  Amiodarone Drug - Drug Interaction Consult Note  Recommendations: Ondansetron may prolong the QT interval therefore use with amiodarone should be avoided if possible. Current order for ondansetron is PRN and patient has not required a dose to date. Would recommend d/c ondansetron.   Amiodarone is metabolized by the cytochrome P450 system and therefore has the potential to cause many drug interactions. Amiodarone has an average plasma half-life of 50 days (range 20 to 100 days).   There is potential for drug interactions to occur several weeks or months after stopping treatment and the onset of drug interactions may be slow after initiating amiodarone.   [x]  Statins: Increased risk of myopathy. Simvastatin- restrict dose to 20mg  daily. Other statins: counsel patients to report any muscle pain or weakness immediately.  [x]  Beta blockers: increased risk of bradycardia, AV block and myocardial depression. Sotalol - avoid concomitant use.   [x]  Drugs that prolong the QT interval:  Torsades de pointes risk may be increased with concurrent use - avoid if possible.  Monitor QTc, also keep magnesium/potassium WNL if concurrent therapy can't be avoided. Marland Kitchen Antibiotics: e.g. fluoroquinolones, erythromycin. . Antiarrhythmics: e.g. quinidine, procainamide, disopyramide, sotalol. . Antipsychotics: e.g. phenothiazines, haloperidol.  . Lithium, tricyclic antidepressants, and methadone. Thank You,  Napoleon Form  07/29/2016 12:35 PM

## 2016-07-29 NOTE — Progress Notes (Signed)
MD notified of the heart rate pause, and low BP, and stopping of th Cardizem gtt. Stated ok to stop gtt for now. Pt is still in Afib/A flutter, runs of VT and occ PVC. Pt is asymptomatic with rhythm. Pt stated much relief with anxiety medication of xanax given early, Stated " I slept off for hours, I am feeling much better also". Pt has not voided since 2300, denies any urge to void, stated "I will pee when I need to", urinal at bedside. Will cont to monitor.

## 2016-07-30 ENCOUNTER — Inpatient Hospital Stay: Payer: Medicare HMO

## 2016-07-30 ENCOUNTER — Inpatient Hospital Stay
Admit: 2016-07-30 | Discharge: 2016-07-30 | Disposition: A | Discharge status: Home / Self Care | Payer: Medicare HMO | Attending: Internal Medicine | Admitting: Internal Medicine

## 2016-07-30 LAB — BASIC METABOLIC PANEL
Anion gap: 6 (ref 5–15)
BUN: 15 mg/dL (ref 6–20)
CALCIUM: 8.7 mg/dL — AB (ref 8.9–10.3)
CHLORIDE: 106 mmol/L (ref 101–111)
CO2: 22 mmol/L (ref 22–32)
CREATININE: 0.79 mg/dL (ref 0.61–1.24)
GFR calc non Af Amer: >60 mL/min (ref >60)
Glucose, Bld: 119 mg/dL — ABNORMAL HIGH (ref 65–99)
Potassium: 3.8 mmol/L (ref 3.5–5.1)
SODIUM: 134 mmol/L — AB (ref 135–145)

## 2016-07-30 LAB — MAGNESIUM: Magnesium: 1.8 mg/dL (ref 1.7–2.4)

## 2016-07-30 IMAGING — DX DG CHEST 1V
1 series · 1 of 1 positions shown · non-contrast
Comparison: None.

CLINICAL DATA: Increased shortness of breath

EXAM:
CHEST 1 VIEW

[chest ap]
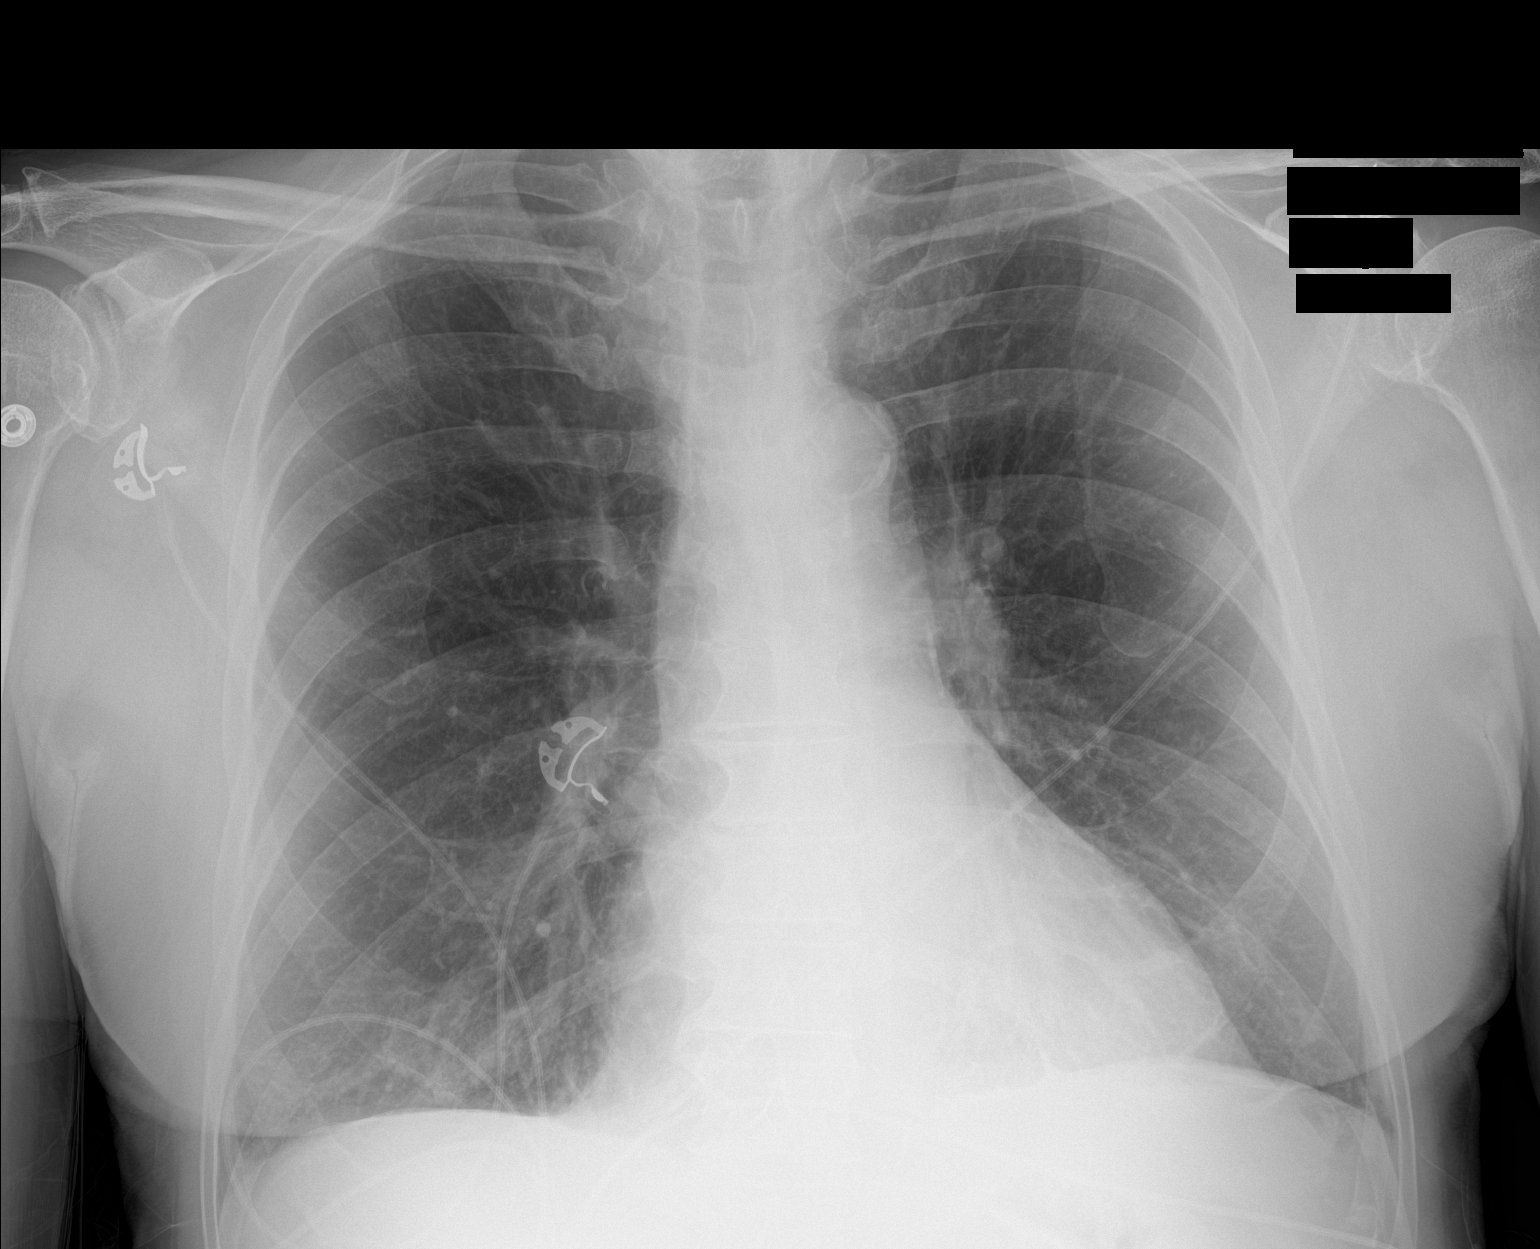

[1 of 1 positions shown; findings below may reference images not displayed]

FINDINGS: There is no edema or consolidation. Heart size and pulmonary
vascularity are normal. No adenopathy. There is atherosclerotic
calcification in the aorta. No bone lesions.
IMPRESSION: Aortic atherosclerosis.  No edema or consolidation.

## 2016-07-30 MED ORDER — DOCUSATE SODIUM 100 MG PO CAPS: 100.0000 mg | ORAL_CAPSULE | Freq: Two times a day (BID) | ORAL | Status: DC | PRN

## 2016-07-30 MED ORDER — AMIODARONE HCL 200 MG PO TABS
400.0000 mg | ORAL_TABLET | ORAL | Status: AC
  Administered 2016-07-30: 400 mg via ORAL
  Filled 2016-07-30: qty 2

## 2016-07-30 MED ORDER — METOPROLOL SUCCINATE ER 50 MG PO TB24
50.0000 mg | ORAL_TABLET | ORAL | Status: AC
  Administered 2016-07-30: 50 mg via ORAL
  Filled 2016-07-30: qty 1

## 2016-07-30 MED ORDER — AMIODARONE LOAD VIA INFUSION
150.0000 mg | Freq: Once | INTRAVENOUS | Status: AC
  Administered 2016-07-30: 150 mg via INTRAVENOUS
  Filled 2016-07-30: qty 83.34

## 2016-07-30 MED ORDER — AMIODARONE HCL IN DEXTROSE 360-4.14 MG/200ML-% IV SOLN
60.0000 mg/h | INTRAVENOUS | Status: AC
  Administered 2016-07-30 (×2): 60 mg/h via INTRAVENOUS
  Filled 2016-07-30: qty 200

## 2016-07-30 MED ORDER — AMIODARONE LOAD VIA INFUSION
150.0000 mg | Freq: Once | INTRAVENOUS | Status: DC | PRN
  Filled 2016-07-30: qty 83.34

## 2016-07-30 MED ORDER — AMIODARONE HCL IN DEXTROSE 360-4.14 MG/200ML-% IV SOLN
30.0000 mg/h | INTRAVENOUS | Status: DC
  Administered 2016-07-31: 30 mg/h via INTRAVENOUS
  Filled 2016-07-30 (×4): qty 200

## 2016-07-30 MED ORDER — MAGNESIUM SULFATE 2 GM/50ML IV SOLN
2.0000 g | Freq: Once | INTRAVENOUS | Status: AC
  Administered 2016-07-30: 2 g via INTRAVENOUS
  Filled 2016-07-30: qty 50

## 2016-07-30 NOTE — Progress Notes (Signed)
Patient had a 10 beat run of V-tach.  HR running between 170 - 180.  Dr. Nehemiah Massed ordered Amiodarone 400 mg po and Metoprolol 50 mg XL po.  Given Stat. Patient is Angry. Made comments that he never had Afib RVR prior to this admission, seemed to be implying we are doing something that is causing this.   Prior to this he was voicing anger about this discharge.  Was told at 0500 by ICU that he would be discharged this morning.  Dr. Nehemiah Massed saw him between 0730 and 0800 and told him he could go home.  Patient informed me of this.  I told him that no discharge had be ordered and I had to wait for the order to proceed.  This prompted further comments about the failure in communication and the delays that he was being subjected to.  Tried to explain to him that the Hospitalist made final decisions based on the recommendations of the specialists.  He then wanted to know if Dr. Nehemiah Massed had made the recommendation.  I told him the note had not yet been written but I didn't know if they had communicated directly.  This made him angrier. I will discuss the the patients dissatisfaction with the charge nurse, and director when she is available.  I will also alert Dr. Vianne Bulls about his concerns and his stress level.

## 2016-07-30 NOTE — Progress Notes (Signed)
  Amiodarone Drug - Drug Interaction Consult Note  Recommendations: Continue to monitor patient.  Amiodarone is metabolized by the cytochrome P450 system and therefore has the potential to cause many drug interactions. Amiodarone has an average plasma half-life of 50 days (range 20 to 100 days).   There is potential for drug interactions to occur several weeks or months after stopping treatment and the onset of drug interactions may be slow after initiating amiodarone.   [x]  Statins: Increased risk of myopathy. Simvastatin- restrict dose to 20mg  daily. Other statins: counsel patients to report any muscle pain or weakness immediately. Patient on Pravastatin 40mg  daily  [x]  Beta blockers: increased risk of bradycardia, AV block and myocardial depression. Sotalol - avoid concomitant use.  Patient on Metoprolol XL 25mg  daily   [x]  Drugs that prolong the QT interval:  Torsades de pointes risk may be increased with concurrent use - avoid if possible.  Monitor QTc, also keep magnesium/potassium WNL if concurrent therapy can't be avoided.   2/21 EKG:  QTc= 526  . Antibiotics: e.g. fluoroquinolones, erythromycin. . Antiarrhythmics: e.g. quinidine, procainamide, disopyramide, sotalol. . Antipsychotics: e.g. phenothiazines, haloperidol.  . Lithium, tricyclic antidepressants, and methadone.  Thank You,  Resa Rinks A  07/30/2016 10:31 AM

## 2016-07-30 NOTE — Progress Notes (Signed)
Spoke with Dr. Nehemiah Massed about the change in cardiac rhythm.  He wants to go ahead with the Amiodarone drip to establish good blood levels.  Drip initiated.  Monitoring HR and BP q 15 minutes.

## 2016-07-30 NOTE — Progress Notes (Addendum)
Telementry report QTc is abnormal at 524.  QTc prolonged at admission at 526.  Cardiac rhythm remains SR with Biphasic P waves.

## 2016-07-30 NOTE — Progress Notes (Signed)
Patient remains in  SR, with pulse stable in the 70's.  Q 15 BP and Pulse discontinued.  Will monitor q 4 H.  Amiodarone drip now decreased to 30 mg/hour.

## 2016-07-30 NOTE — Care Management (Addendum)
Patient transferred out of icu to 2A from ICU.  Admitted from home after proceeding to the ED from home for a nose bleed that would not stop.  Patient is on chronic Xarelto.  Required cardizem drip for rate control of atrial fibrillation. Has had some runs of ventricular tachycardia.  Echo results pending.  Reached out to [facility] as it appears patient is a resident of that community

## 2016-07-30 NOTE — Progress Notes (Signed)
[facility] Encounter Note  Patient: Logan Morgan / Admit Date: 07/28/2016 / Date of Encounter: 07/30/2016, 11:55 AM   Subjective: No evidence of chest pain or congestive heart failure throughout his hospitalization. 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 Patient had long discussion about further treatment of atrial fibrillation with relative failure of Tikosyn and use in the recent past and possible use of amiodarone. Recurrent afib with rapid rate today This was discussed with Dr. Marcello Moores and Dr. Ubaldo Glassing as well for which she has concurred that amiodarone use is reasonable for arm use to improve ventricular tachycardia occurrences as well as possible maintenance of normal sinus rhythm.  Review of Systems: Positive for: Nose bleed now significantly improved Negative for: Vision change, hearing change, syncope, dizziness, nausea, vomiting,diarrhea, bloody stool, stomach pain, cough, congestion, diaphoresis, urinary frequency, urinary pain,skin lesions, skin rashes Others previously listed  Objective: Telemetry: Atrial fibrillation with variable heart rate Physical Exam: Blood pressure 120/77, pulse 72, temperature 98.6 F (37 C), temperature source Oral, resp. rate 17, height 5\' 8"  (1.727 m), weight 77.4 kg (170 lb 10.2 oz), SpO2 93 %. Body mass index is 25.95 kg/m. General: Well developed, well nourished, in no acute distress. Head: Normocephalic, atraumatic, sclera non-icteric, no xanthomas, nares are without discharge. Neck: No apparent masses Lungs: Normal respirations with no wheezes, no rhonchi, no rales , no crackles   Heart: Irregular rate and rhythm, normal S1 S2, no murmur, no rub, no gallop, PMI is normal size and placement,  carotid upstroke normal without bruit, jugular venous pressure normal Abdomen: Soft, non-tender, non-distended with normoactive bowel sounds. No hepatosplenomegaly. Abdominal aorta is normal size without bruit Extremities: No edema, no clubbing, no cyanosis, no ulcers,  Peripheral: 2+ radial, 2+ femoral, 2+ dorsal pedal pulses Neuro: Alert and oriented. Moves all extremities spontaneously. Psych:  Responds to questions appropriately with a normal affect.   Intake/Output Summary (Last 24 hours) at 07/30/16 1155 Last data filed at 07/30/16 0600  Gross per 24 hour  Intake              500 ml  Output             2776 ml  Net            -2276 ml    Inpatient Medications:  . folic acid  [id number] mcg Oral Daily  . metoprolol succinate  25 mg Oral Daily  . mometasone-formoterol  2 puff Inhalation BID  . potassium chloride SA  20 mEq Oral Daily  . pravastatin  40 mg Oral Daily  . rivaroxaban  20 mg Oral Q supper  . sertraline  50 mg Oral Daily  . sodium chloride flush  3 mL Intravenous Q12H  . tamsulosin  0.4 mg Oral Daily  . tiotropium  18 mcg Inhalation Daily   Infusions:  . sodium chloride    . amiodarone 60 mg/hr (07/30/16 1048)   Followed by  . amiodarone      Labs:  Recent Labs  07/28/16 0451 07/29/16 0537 07/30/16 1112  NA 139 133* 134*  K 4.1 4.1 3.8  CL 109 106 106  CO2 21* 23 22  GLUCOSE 89 100* 119*  BUN 16 16 15   CREATININE 0.79 0.91 0.79  CALCIUM 9.2 8.3* 8.7*  MG 2.0  --  1.8  Recent Labs  07/28/16 0451  AST 38  ALT 33  ALKPHOS 39  BILITOT 0.7  PROT 6.9  ALBUMIN 4.3    Recent Labs  07/28/16 0451 07/29/16 0537  WBC 7.5 8.3  HGB 15.7 13.9  HCT 45.5 39.2*  MCV 92.9 92.0  PLT 238 223    Recent Labs  07/28/16 0451  TROPONINI <0.03   Invalid input(s): POCBNP No results for input(s): HGBA1C in the last 72 hours.   Weights: Filed Weights   07/28/16 1440 07/29/16 1700 07/30/16 0450  Weight: 77.7 kg (171 lb 4.8 oz) 78.8 kg (173 lb 11.6 oz)  77.4 kg (170 lb 10.2 oz)     Radiology/Studies:  No results found.   Assessment and Recommendation  76 y.o. male with apparent nosebleed now having atrial fibrillation with rapid ventricular rate and right ventricular outflow tract ventricular tachycardia both relatively asymptomatic needing further treatment 1. Continue metoprolol for heart rate control of atrial fibrillation And/or maintenance of normal sinus rhythm for goal heart rate between 60 and 90 bpm 2. Continue anticoagulation for further risk reduction in stroke with atrial fibrillation 3. Addition of amiodarone drip for loading to reduce amount of ventricular tachycardia and begin treatment of atrial fibrillation in preparation for discharge to home 4. Consider echocardiogram for LV systolic dysfunction valvular heart disease contributing to above and Holter monitor at the clinic after discharge to assess for weekend tachycardia and need for adjustments of medication management 5. Continue amiodarone intravenous load throughout this afternoon into the evening and if maintaining normal sinus rhythm would be okay for discharge home from cardiac standpoint with amiodarone 400 mg twice per day until seen in the office  Signed, Serafina Royals M.D. FACC

## 2016-07-30 NOTE — Progress Notes (Signed)
[place] [facility] [place] NAME: Logan Morgan    MR#:  [id number]  DATE OF BIRTH:  April 19, 1941  SUBJECTIVE: A shunt is seen at bedside, transferred from ICU to tele. having heart rhythm anywhere varying between atrial fibrillation, V. tach, sinus. Heart rate as up as 160s. Patient is a did not have anyshortness of breath O2 sats dropped to 92% on room air.now on  2 L of oxygen.   aint  Chief Complaint  Patient presents with  . Epistaxis    REVIEW OF SYSTEMS:   ROS CONSTITUTIONAL:  has fatigue  EYES: No blurred or double vision.  EARS, NOSE, AND THROAT: No tinnitus or ear pain.  RESPIRATORY: No cough, shortness of breath, wheezing or hemoptysis.  CARDIOVASCULAR: No chest pain, orthopnea, edema.  GASTROINTESTINAL: No nausea, vomiting, diarrhea or abdominal pain.  GENITOURINARY: No dysuria, hematuria.  ENDOCRINE: No polyuria, nocturia,  HEMATOLOGY: No anemia, easy bruising or bleeding SKIN: No rash or lesion. MUSCULOSKELETAL: No joint pain or arthritis.   NEUROLOGIC: No tingling, numbness, weakness.  PSYCHIATRY: No anxiety or depression.   DRUG ALLERGIES:  No Known Allergies  VITALS:  Blood pressure 105/80, pulse 87, temperature 98 F (36.7 C), temperature source Oral, resp. rate 18, height 5\' 8"  (1.727 m), weight 77.4 kg (170 lb 10.2 oz), SpO2 94 %.  PHYSICAL EXAMINATION:  GENERAL:  76 y.o.-year-old patient lying in the bed with no acute distress.  EYES: Pupils equal, round, reactive to light and accommodation. No scleral icterus. Extraocular muscles intact.  HEENT: Head atraumatic, normocephalic. Oropharynx and nasopharynx clear.  NECK:  Supple, no jugular venous distention. No thyroid enlargement, no tenderness.  LUNGS: Normal breath sounds bilaterally, no wheezing, rales,rhonchi or crepitation. No use of accessory muscles of respiration.  CARDIOVASCULAR: S1, S2  Irregular,tachycardic. No murmurs, rubs, or gallops.  ABDOMEN:  Soft, nontender, nondistended. Bowel sounds present. No organomegaly or mass.  EXTREMITIES: No pedal edema, cyanosis, or clubbing.  NEUROLOGIC: Cranial nerves II through XII are intact. Muscle strength 5/5 in all extremities. Sensation intact. Gait not checked.  PSYCHIATRIC: The patient is alert and oriented x 3.  SKIN: No obvious rash, lesion, or ulcer.    LABORATORY PANEL:   CBC  Recent Labs Lab 07/29/16 0537  WBC 8.3  HGB 13.9  HCT 39.2*  PLT 223   ------------------------------------------------------------------------------------------------------------------  Chemistries   Recent Labs Lab 07/28/16 0451 07/29/16 0537  NA 139 133*  K 4.1 4.1  CL 109 106  CO2 21* 23  GLUCOSE 89 100*  BUN 16 16  CREATININE 0.79 0.91  CALCIUM 9.2 8.3*  MG 2.0  --   AST 38  --   ALT 33  --   ALKPHOS 39  --   BILITOT 0.7  --    ------------------------------------------------------------------------------------------------------------------  Cardiac Enzymes  Recent Labs Lab 07/28/16 0451  TROPONINI <0.03   ------------------------------------------------------------------------------------------------------------------  RADIOLOGY:  No results found.  EKG:   Orders placed or performed during the hospital encounter of 07/28/16  . EKG 12-lead  . EKG 12-Lead  . EKG 12-Lead  . EKG 12-Lead  . EKG 12-Lead  . EKG 12-Lead pre-cardioversion  . EKG 12-Lead  . EKG 12-Lead pre-cardioversion  . EKG 12-Lead    ASSESSMENT AND PLAN:   76 year old male with the atrial fibrillation with rapid ventricular rate and wide complex tachycardia Relative failure of Tikosyn use. Because patient persistently has V. tach, A. fib cardiology recommended amiodarone drip. Patient already on Toprol-XL 25 mg daily,  amiodarone bolus 400 mg by mouth was given this morning and now started on drip. We will recheck electrolytes including potassium and magnesium today. Pt very concerned about  \management of his afid/vtach/. I spoke with Dr. Nehemiah Massed, since patient is not symptomatic, with normal echo, normal blood pressure, the fact that he converted to sinus rhythm before he would not recommend any different treatment. He recommends amiodarone drip did not recommend any cardioversion.continue xarelto.   2 .hyperlipidemia continue simvastatin decreased the dose to 20 mg due to drug interactions   #3 hypoxia: Lungs are clear. Likely secondary to tachycardia. Check  chest x-ray portable today. D/w RN,Dr.Kowalski  All the records are reviewed and case discussed with Care Management/Social Workerr. Management plans discussed with the patient, family and they are in agreement.  CODE STATUS: full  TOTAL TIME TAKING CARE OF THIS PATIENT: 35 minutes.   POSSIBLE D/C IN 2-3DAYS, DEPENDING ON CLINICAL CONDITION.   Epifanio Lesches M.D on 07/30/2016 at 10:57 AM  Between 7am to 6pm - Pager - [phone]  After 6pm go to [email or web address] - password [email or web address] [facility]  Office  [phone]  CC: Primary care physician; Tracie Harrier, MD   Note: This dictation was prepared with Dragon dictation along with smaller phrase technology. Any transcriptional errors that result from this process are unintentional.

## 2016-07-30 NOTE — Progress Notes (Signed)
[facility] [place] NAME: Logan Morgan    MR#:  [id number]  DATE OF BIRTH:  July 28, 1940  SUBJECTIVE: he is seen at bedside in ICU, admitted for epistaxis and found to have a ventricular arrhythmia. Admitted to intensive care unit. Patient did not have any dizziness but complains of fatigue.. Patient has been on Tikosyn  At home,.  aint  Chief Complaint  Patient presents with  . Epistaxis    REVIEW OF SYSTEMS:   ROS CONSTITUTIONAL:  has fatigue  EYES: No blurred or double vision.  EARS, NOSE, AND THROAT: No tinnitus or ear pain.  RESPIRATORY: No cough, shortness of breath, wheezing or hemoptysis.  CARDIOVASCULAR: No chest pain, orthopnea, edema.  GASTROINTESTINAL: No nausea, vomiting, diarrhea or abdominal pain.  GENITOURINARY: No dysuria, hematuria.  ENDOCRINE: No polyuria, nocturia,  HEMATOLOGY: No anemia, easy bruising or bleeding SKIN: No rash or lesion. MUSCULOSKELETAL: No joint pain or arthritis.   NEUROLOGIC: No tingling, numbness, weakness.  PSYCHIATRY: No anxiety or depression.   DRUG ALLERGIES:  No Known Allergies  VITALS:  Blood pressure 105/80, pulse 87, temperature 98 F (36.7 C), temperature source Oral, resp. rate 18, height 5\' 8"  (1.727 m), weight 77.4 kg (170 lb 10.2 oz), SpO2 94 %.  PHYSICAL EXAMINATION:  GENERAL:  76 y.o.-year-old patient lying in the bed with no acute distress.  EYES: Pupils equal, round, reactive to light and accommodation. No scleral icterus. Extraocular muscles intact.  HEENT: Head atraumatic, normocephalic. Oropharynx and nasopharynx clear.  NECK:  Supple, no jugular venous distention. No thyroid enlargement, no tenderness.  LUNGS: Normal breath sounds bilaterally, no wheezing, rales,rhonchi or crepitation. No use of accessory muscles of respiration.  CARDIOVASCULAR: S1, S2  Irregular,tachycardic. No murmurs, rubs, or gallops.  ABDOMEN: Soft, nontender, nondistended. Bowel sounds  present. No organomegaly or mass.  EXTREMITIES: No pedal edema, cyanosis, or clubbing.  NEUROLOGIC: Cranial nerves II through XII are intact. Muscle strength 5/5 in all extremities. Sensation intact. Gait not checked.  PSYCHIATRIC: The patient is alert and oriented x 3.  SKIN: No obvious rash, lesion, or ulcer.    LABORATORY PANEL:   CBC  Recent Labs Lab 07/29/16 0537  WBC 8.3  HGB 13.9  HCT 39.2*  PLT 223   ------------------------------------------------------------------------------------------------------------------  Chemistries   Recent Labs Lab 07/28/16 0451 07/29/16 0537  NA 139 133*  K 4.1 4.1  CL 109 106  CO2 21* 23  GLUCOSE 89 100*  BUN 16 16  CREATININE 0.79 0.91  CALCIUM 9.2 8.3*  MG 2.0  --   AST 38  --   ALT 33  --   ALKPHOS 39  --   BILITOT 0.7  --    ------------------------------------------------------------------------------------------------------------------  Cardiac Enzymes  Recent Labs Lab 07/28/16 0451  TROPONINI <0.03   ------------------------------------------------------------------------------------------------------------------  RADIOLOGY:  No results found.  EKG:   Orders placed or performed during the hospital encounter of 07/28/16  . EKG 12-lead  . EKG 12-Lead  . EKG 12-Lead  . EKG 12-Lead  . EKG 12-Lead  . EKG 12-Lead pre-cardioversion  . EKG 12-Lead  . EKG 12-Lead pre-cardioversion  . EKG 12-Lead    ASSESSMENT AND PLAN:   76 year old male with the atrial fibrillation with rapid ventricular rate and wide complex tachycardia possibly induced by the medication management  As per cardio; On Beta blockers.  Will keep the potassium more than 4 on admission more than 2, continue anticoagulation andpacked for nosebleeds continue that.  2 .hyperlipidemia  continue simvastatin decreased the dose to 20 mg due to drug interactions   patient put on nothing by mouth by cardiology for possible cardioversion. Patient not  aware of procedure.   All the records are reviewed and case discussed with Care Management/Social Workerr. Management plans discussed with the patient, family and they are in agreement.  CODE STATUS: full  TOTAL TIME TAKING CARE OF THIS PATIENT: 35 minutes.   POSSIBLE D/C IN 2-3DAYS, DEPENDING ON CLINICAL CONDITION.   Epifanio Lesches M.D on 07/30/2016 at 10:47 AM  Between 7am to 6pm - Pager - [phone]  After 6pm go to [email or web address] - password [email or web address] [facility]  Office  [phone]  CC: Primary care physician; Tracie Harrier, MD   Note: This dictation was prepared with Dragon dictation along with smaller phrase technology. Any transcriptional errors that result from this process are unintentional.

## 2016-07-30 NOTE — Plan of Care (Signed)
Problem: Cardiac: Goal: Ability to achieve and maintain adequate cardiopulmonary perfusion will improve Outcome: Progressing  Patient self converted from A-fib to NS. Then after becoming very agitated returned to Afib RVR.  Medication changes made to resolve the rapid heart rate.

## 2016-07-30 NOTE — Progress Notes (Signed)
Cardiac rhythm since the oral amiodarone and metoprolol dose at 0900 has been A fib RVR with heart rate in the 170's and 180's.  Short episodes of SR - approximately 5 - 10 beats, the 5- 10 beats of Vtach and return to A fib RVR.  Amiodarone bolus and drip ordered.  Just prior to initiating the amiodarone drip the patient converted to SR and has remained in SR for about 10 minutes with heart rate of 70 - 80.

## 2016-07-31 LAB — ECHOCARDIOGRAM COMPLETE
HEIGHTINCHES: 68 in
WEIGHTICAEL: 2730.18 [oz_av]

## 2016-07-31 MED ORDER — AMIODARONE HCL 200 MG PO TABS: 400.0000 mg | ORAL_TABLET | Freq: Two times a day (BID) | ORAL | 0 refills | Status: DC

## 2016-07-31 NOTE — Progress Notes (Signed)
Amiodarone drip was discontinued at 0310 r/t pt's HR being in the 58-62, pt's HR is currently in NSR, HR 60's-70's. Will continue to monitor.

## 2016-07-31 NOTE — Progress Notes (Signed)
Logan Morgan at [place] NAME: Logan Morgan    MR#:  [id number]  DATE OF BIRTH:  01-20-1941  SUBJECTIVE: HR is improved after amiodarone drip, heart rate stable around the 60s to 70s. Patient feels good today.   aint  Chief Complaint  Patient presents with  . Epistaxis    REVIEW OF SYSTEMS:   ROS CONSTITUTIONAL:  has fatigue  EYES: No blurred or double vision.  EARS, NOSE, AND THROAT: No tinnitus or ear pain.  RESPIRATORY: No cough, shortness of breath, wheezing or hemoptysis.  CARDIOVASCULAR: No chest pain, orthopnea, edema.  GASTROINTESTINAL: No nausea, vomiting, diarrhea or abdominal pain.  GENITOURINARY: No dysuria, hematuria.  ENDOCRINE: No polyuria, nocturia,  HEMATOLOGY: No anemia, easy bruising or bleeding SKIN: No rash or lesion. MUSCULOSKELETAL: No joint pain or arthritis.   NEUROLOGIC: No tingling, numbness, weakness.  PSYCHIATRY: No anxiety or depression.   DRUG ALLERGIES:  No Known Allergies  VITALS:  Blood pressure 138/76, pulse 70, temperature 97.5 F (36.4 C), temperature source Oral, resp. rate 18, height 5\' 8"  (1.727 m), weight 73.6 kg (162 lb 4 oz), SpO2 95 %.  PHYSICAL EXAMINATION:  GENERAL:  76 y.o.-year-old patient lying in the bed with no acute distress.  EYES: Pupils equal, round, reactive to light and accommodation. No scleral icterus. Extraocular muscles intact.  HEENT: Head atraumatic, normocephalic. Oropharynx and nasopharynx clear.  NECK:  Supple, no jugular venous distention. No thyroid enlargement, no tenderness.  LUNGS: Normal breath sounds bilaterally, no wheezing, rales,rhonchi or crepitation. No use of accessory muscles of respiration.  CARDIOVASCULAR: S1, S2  Irregular,tachycardic. No murmurs, rubs, or gallops.  ABDOMEN: Soft, nontender, nondistended. Bowel sounds present. No organomegaly or mass.  EXTREMITIES: No pedal edema, cyanosis, or clubbing.  NEUROLOGIC: Cranial nerves II through  XII are intact. Muscle strength 5/5 in all extremities. Sensation intact. Gait not checked.  PSYCHIATRIC: The patient is alert and oriented x 3.  SKIN: No obvious rash, lesion, or ulcer.    LABORATORY PANEL:   CBC  Recent Labs Lab 07/29/16 0537  WBC 8.3  HGB 13.9  HCT 39.2*  PLT 223   ------------------------------------------------------------------------------------------------------------------  Chemistries   Recent Labs Lab 07/28/16 0451  07/30/16 1112  NA 139  < > 134*  K 4.1  < > 3.8  CL 109  < > 106  CO2 21*  < > 22  GLUCOSE 89  < > 119*  BUN 16  < > 15  CREATININE 0.79  < > 0.79  CALCIUM 9.2  < > 8.7*  MG 2.0  --  1.8  AST 38  --   --   ALT 33  --   --   ALKPHOS 39  --   --   BILITOT 0.7  --   --   < > = values in this interval not displayed. ------------------------------------------------------------------------------------------------------------------  Cardiac Enzymes  Recent Labs Lab 07/28/16 0451  TROPONINI <0.03   ------------------------------------------------------------------------------------------------------------------  RADIOLOGY:  Dg Chest 1 View  Result Date: 07/30/2016 CLINICAL DATA:  Increased shortness of breath EXAM: CHEST 1 VIEW COMPARISON:  None. FINDINGS: There is no edema or consolidation. Heart size and pulmonary vascularity are normal. No adenopathy. There is atherosclerotic calcification in the aorta. No bone lesions. IMPRESSION: Aortic atherosclerosis.  No edema or consolidation. Electronically Signed   By: Lowella Grip III M.D.   On: 07/30/2016 12:02    EKG:   Orders placed or performed during the hospital encounter of 07/28/16  .  EKG 12-lead  . EKG 12-Lead  . EKG 12-Lead  . EKG 12-Lead  . EKG 12-Lead  . EKG 12-Lead pre-cardioversion  . EKG 12-Lead  . EKG 12-Lead pre-cardioversion  . EKG 12-Lead  . EKG 12-Lead  . EKG 12-Lead    ASSESSMENT AND PLAN:   76 year old male with the atrial fibrillation with  rapid ventricular rate and wide complex tachycardia Relative failure of Tikosyn use. Because patient persistently has V. tach, A. fib cardiology recommended amiodarone drip. Patient already on Toprol-XL 25 mg daily,  HRimproved, cardiology recommended. Amiodarone 400 mg by mouth twice a day at discharge. Discharge  today with Xarelto, amiodarone, beta blockers. With the cardiology as an outpatient.  2 .hyperlipidemia continue simvastatin decreased the dose to 20 mg due to drug interactions   #3 hypoxia: Lungs are clear. Chest x-ray negative for pneumonia. All the records are reviewed and case discussed with Care Management/Social Workerr. Management plans discussed with the patient, family and they are in agreement.  CODE STATUS: full  TOTAL TIME TAKING CARE OF THIS PATIENT: 35 minutes.   D/c home today,.  Epifanio Morgan M.D on 07/31/2016 at 2:17 PM  Between 7am to 6pm - Pager - [phone]  After 6pm go to [email or web address] - password [email or web address] [facility]  Office  [phone]  CC: Primary care physician; Tracie Harrier, MD   Note: This dictation was prepared with Dragon dictation along with smaller phrase technology. Any transcriptional errors that result from this process are unintentional.

## 2016-07-31 NOTE — Progress Notes (Signed)
[facility] Encounter Note  Patient: Logan Morgan / Admit Date: 07/28/2016 / Date of Encounter: 07/31/2016, 8:23 AM   Subjective: No evidence of chest pain or congestive heart failure throughout his hospitalization. 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 Patient had long discussion about further treatment of atrial fibrillation with relative failure of Tikosyn and use in the recent past and possible use of amiodarone. Recurrent afib with rapid rate today This was discussed with Dr. Marcello Moores and Dr. Ubaldo Glassing as well for which she has concurred that amiodarone use is reasonable for arm use to improve ventricular tachycardia occurrences as well as possible maintenance of normal sinus rhythm. No further concerns this am  Review of Systems: Positive for: Nose bleed now significantly improved Negative for: Vision change, hearing change, syncope, dizziness, nausea, vomiting,diarrhea, bloody stool, stomach pain, cough, congestion, diaphoresis, urinary frequency, urinary pain,skin lesions, skin rashes Others previously listed  Objective: Telemetry: Atrial fibrillation with variable heart rate Physical Exam: Blood pressure (!) 144/88, pulse 66, temperature 98.4 F (36.9 C), temperature source Oral, resp. rate 18, height 5\' 8"  (1.727 m), weight 73.6 kg (162 lb 4 oz), SpO2 94 %. Body mass index is 24.67 kg/m. General: Well developed, well nourished, in no acute distress. Head: Normocephalic, atraumatic, sclera non-icteric, no xanthomas, nares are without discharge. Neck: No apparent masses Lungs: Normal respirations with no wheezes, no rhonchi, no rales , no crackles   Heart: Irregular rate and rhythm, normal S1 S2, no  murmur, no rub, no gallop, PMI is normal size and placement, carotid upstroke normal without bruit, jugular venous pressure normal Abdomen: Soft, non-tender, non-distended with normoactive bowel sounds. No hepatosplenomegaly. Abdominal aorta is normal size without bruit Extremities: No edema, no clubbing, no cyanosis, no ulcers,  Peripheral: 2+ radial, 2+ femoral, 2+ dorsal pedal pulses Neuro: Alert and oriented. Moves all extremities spontaneously. Psych:  Responds to questions appropriately with a normal affect.   Intake/Output Summary (Last 24 hours) at 07/31/16 0823 Last data filed at 07/31/16 0310  Gross per 24 hour  Intake           902.34 ml  Output             1100 ml  Net          -197.66 ml    Inpatient Medications:  . folic acid  [id number] mcg Oral Daily  . metoprolol succinate  25 mg Oral Daily  . mometasone-formoterol  2 puff Inhalation BID  . potassium chloride SA  20 mEq Oral Daily  . pravastatin  40 mg Oral Daily  . rivaroxaban  20 mg Oral Q supper  . sertraline  50 mg Oral Daily  . sodium chloride flush  3 mL Intravenous Q12H  . tamsulosin  0.4 mg Oral Daily  . tiotropium  18 mcg Inhalation Daily   Infusions:  . sodium chloride    . amiodarone Stopped (07/31/16 0310)    Labs:  Recent Labs  07/29/16 0537 07/30/16 1112  NA 133* 134*  K 4.1 3.8  CL 106 106  CO2 23 22  GLUCOSE 100* 119*  BUN 16 15  CREATININE 0.91 0.79  CALCIUM 8.3* 8.7*  MG  --  1.8   No results for input(s): AST, ALT, ALKPHOS, BILITOT, PROT, ALBUMIN in the last  72 hours.  Recent Labs  07/29/16 0537  WBC 8.3  HGB 13.9  HCT 39.2*  MCV 92.0  PLT 223   No results for input(s): CKTOTAL, CKMB, TROPONINI in the last 72 hours. Invalid input(s): POCBNP No results for input(s): HGBA1C in the last 72 hours.   Weights: Filed Weights   07/29/16 1700 07/30/16 0450 07/31/16 0431  Weight: 78.8 kg (173 lb 11.6 oz) 77.4 kg (170 lb 10.2 oz) 73.6 kg (162 lb 4 oz)     Radiology/Studies:  Dg  Chest 1 View  Result Date: 07/30/2016 CLINICAL DATA:  Increased shortness of breath EXAM: CHEST 1 VIEW COMPARISON:  None. FINDINGS: There is no edema or consolidation. Heart size and pulmonary vascularity are normal. No adenopathy. There is atherosclerotic calcification in the aorta. No bone lesions. IMPRESSION: Aortic atherosclerosis.  No edema or consolidation. Electronically Signed   By: Lowella Grip III M.D.   On: 07/30/2016 12:02     Assessment and Recommendation  76 y.o. male with apparent nosebleed now having atrial fibrillation with rapid ventricular rate and right ventricular outflow tract ventricular tachycardia both relatively asymptomatic needing further treatment 1. Continue metoprolol for heart rate control of atrial fibrillation And/or maintenance of normal sinus rhythm for goal heart rate between 60 and 90 bpm 2. Continue anticoagulation for further risk reduction in stroke with atrial fibrillation 3 discontinue  of amiodarone drip for loading to reduce amount of ventricular tachycardia and begin treatment of atrial fibrillation in preparation for discharge to home with amiodaonre 400mg  bid 4. Consider echocardiogram for LV systolic dysfunction valvular heart disease contributing to above and Holter monitor at the clinic after discharge to assess for weekend tachycardia and need for adjustments of medication management 5. No further cardiac diagnositcs at this time and ok for dc to home today  Signed, Serafina Royals M.D. FACC

## 2016-07-31 NOTE — Progress Notes (Signed)
Pt has been in NSR on shift,prolong QTc, with HR in the 60-65 after 0000,MD Diamond made aware, verbal order received to keep monitoring pt.

## 2016-07-31 NOTE — Progress Notes (Signed)
Discharge paperwork reviewed with patient and wife. Information regarding follow-up appointments, medications and education for all newly prescribed meds was provided, all questions answered. Peripheral IV removed, catheter intact. Heart monitor removed, returned to the nurse station. Transport requested via wheelchair to the lobby for discharge.

## 2016-08-05 NOTE — Discharge Summary (Signed)
Logan Morgan, is a 76 y.o. male  DOB 1940-07-18  MRN [id number].  Admission date:  07/28/2016  Admitting Physician  Max Sane, MD  Discharge Date:  07/31/2016   Primary MD  Tracie Harrier, MD  Recommendations for primary care physician for things to follow:  Up with primary doctor in one week Follow up with cardiologist Dr.Fath in one week    Admission Diagnosis  Epistaxis [R04.0] Ventricular tachycardia ([place]) [I47.2] Atrial fibrillation with rapid ventricular response ([place]) [I48.91]   Discharge Diagnosis  Epistaxis [R04.0] Ventricular tachycardia ([place]) [I47.2] Atrial fibrillation with rapid ventricular response ([place]) [I48.91]   Active Problems:   Ventricular tachyarrhythmia [facility])      Past Medical History:  Diagnosis Date  . COPD (chronic obstructive pulmonary disease) ([place])   . Dysrhythmia     History reviewed. No pertinent surgical history.     History of present illness and  Hospital Course:     Kindly see H&P for history of present illness and admission details, please review complete Labs, Consult reports and Test reports for all details in brief  HPI  from the history and physical done on the day of admission Logan Morgan  is a 76 y.o. male with a known history of HTN, Paroxysmal A.fib, COPD being admitted for Ventricular tachyarrhythmias. Patient has been going through lots of stress at home. Had rectal bleed few weeks ago for which his Xarelto was on held. Restarted last week but started having nose bleeds last night. Didn't stop so decided to come to ED. While in ED he was found to be in rapid a.fib. Nose was packed and bleeding stopped but he started having ventricular tachyarrythmias. So he is being admitted for further eval and mgmt.   Hospital Course  76 year old male patient with atrial  fibrillation with RVR and wide complex tachycardia induced by medication management medication: Seen by cardiology, Tikosyn discontinued,  secondary to concern for long QT interval tachycardia/induction of ventricular tachycardia. Patient received magnesium supplements in IV form. And low-dose beta blockers are started by cardiology. Cardiology followed the patient every day. Patient initially did not receive amiodarone or Tikosyn consult because of concern for cardiac exam irritability.  patient continued to have V. tach/A. fib, normal sinus rhythm with heart rate pending between 160s to 140s to 90s concerning this started on amiodarone drip, patient heart rate improved to 60s to 70 bpm, discharge home with amiodarone 400 mg by mouth twice a day, continued on Xarelto, follow-up with cardiology for echocardiogram. Patient's echocardiogram during this hospitalization did not show any structural abnormalities, EF more than 50%. Discharged home with Toprol-XL 25 mg daily, amiodarone 400 mg by mouth daily, Xarelto. 2 .hyperlipidemia continue simvastatin decreased the dose to 20 mg due to drug interactions   #3 hypoxia: Lungs are clear. Chest x-ray negative for pneumonia. For depression: Continue Zoloft #5. BPH: Continue Flomax #6. COPD: Continue spiriva. Discharge Condition: stable   Follow UP  Follow-up Information    HANDE,VISHWANATH, MD. Go on 08/12/2016.   Specialty:  Internal Medicine Why:  Appointment Time: 11:00am Contact information: [place] [address] [place], [place]. Go on 08/06/2016.   Specialty:  Cardiology Why:  Appointment Time: 11:30am Contact information: [facility] [place] [address] [phone]             Discharge Instructions  and  Discharge Medications      Allergies  as of 07/31/2016   No Known Allergies     Medication List    STOP taking  these medications   cephALEXin 500 MG capsule Commonly known as:  KEFLEX   dofetilide 250 MCG capsule Commonly known as:  TIKOSYN     TAKE these medications   albuterol 108 (90 Base) MCG/ACT inhaler Commonly known as:  PROVENTIL HFA;VENTOLIN HFA Inhale 2 puffs into the lungs daily as needed for wheezing or shortness of breath.   amiodarone 200 MG tablet Commonly known as:  PACERONE Take 2 tablets (400 mg total) by mouth 2 (two) times daily.   folic acid Q000111Q MCG tablet Commonly known as:  FOLVITE Take 400 mcg by mouth daily.   glucosamine-chondroitin 500-400 MG tablet Take 1 tablet by mouth 2 (two) times daily.   losartan 100 MG tablet Commonly known as:  COZAAR Take 100 mg by mouth daily.   metoprolol succinate 25 MG 24 hr tablet Commonly known as:  TOPROL-XL Take 25 mg by mouth daily.   milk thistle 175 MG tablet Take 175 mg by mouth daily.   DULERA IN Inhale 2 puffs into the lungs daily as needed.   mometasone-formoterol 100-5 MCG/ACT Aero Commonly known as:  DULERA Inhale into the lungs.   ONE-A-DAY MENS PO Take 1 tablet by mouth daily.   potassium chloride SA 20 MEQ tablet Commonly known as:  K-DUR,KLOR-CON Take 1 tablet (20 mEq total) by mouth daily.   pravastatin 40 MG tablet Commonly known as:  PRAVACHOL Take 40 mg by mouth daily.   rivaroxaban 20 MG Tabs tablet Commonly known as:  XARELTO Take 20 mg by mouth daily with supper.   sertraline 50 MG tablet Commonly known as:  ZOLOFT TAKE 1 TABLET ONCE DAILY   tamsulosin 0.4 MG Caps capsule Commonly known as:  FLOMAX Take 0.4 mg by mouth daily.   tiotropium 18 MCG inhalation capsule Commonly known as:  SPIRIVA Place 18 mcg into inhaler and inhale daily.         Diet and Activity recommendation: See Discharge Instructions above   Consults obtained - cardiology DR.Kowalksi   Major procedures and Radiology Reports - PLEASE review detailed and final reports for all details, in brief -       Dg Chest 1 View  Result Date: 07/30/2016 CLINICAL DATA:  Increased shortness of breath EXAM: CHEST 1 VIEW COMPARISON:  None. FINDINGS: There is no edema or consolidation. Heart size and pulmonary vascularity are normal. No adenopathy. There is atherosclerotic calcification in the aorta. No bone lesions. IMPRESSION: Aortic atherosclerosis.  No edema or consolidation. Electronically Signed   By: Lowella Grip III M.D.   On: 07/30/2016 12:02    Micro Results     Recent Results (from the past 240 hour(s))  MRSA PCR Screening     Status: None   Collection Time: 07/28/16  7:45 PM  Result Value Ref Range Status   MRSA by PCR NEGATIVE NEGATIVE Final    Comment:        The GeneXpert MRSA Assay (FDA approved for NASAL specimens only), is one component of a comprehensive MRSA colonization surveillance program. It is not intended to diagnose MRSA infection nor to guide or monitor treatment for MRSA infections.        Today   Subjective:   Logan Morgan today has  No chest pain,no SOB.feels better ,wants to go home. Objective:   Blood pressure 138/76, pulse 70, temperature 97.5 F (36.4 C), temperature source Oral, resp. rate  18, height 5\' 8"  (1.727 m), weight 73.6 kg (162 lb 4 oz), SpO2 95 %.  No intake or output data in the 24 hours ending 08/05/16 1706  Exam Awake Alert, Oriented x 3, No new F.N deficits, Normal affect [place].AT,PERRAL Supple Neck,No JVD, No cervical lymphadenopathy appriciated.  Symmetrical Chest wall movement, Good air movement bilaterally, CTAB RRR,No Gallops,Rubs or new [place], No Parasternal Heave +ve B.Sounds, Abd Soft, Non tender, No organomegaly appriciated, No rebound -guarding or rigidity. No Cyanosis, Clubbing or edema, No new Rash or bruise  Data Review   CBC w Diff:  Lab Results  Component Value Date   WBC 8.3 07/29/2016   HGB 13.9 07/29/2016   HCT 39.2 (L) 07/29/2016   PLT 223 07/29/2016    CMP:  Lab Results  Component  Value Date   NA 134 (L) 07/30/2016   K 3.8 07/30/2016   CL 106 07/30/2016   CO2 22 07/30/2016   BUN 15 07/30/2016   CREATININE 0.79 07/30/2016   PROT 6.9 07/28/2016   ALBUMIN 4.3 07/28/2016   BILITOT 0.7 07/28/2016   ALKPHOS 39 07/28/2016   AST 38 07/28/2016   ALT 33 07/28/2016  .   Total Time in preparing paper work, data evaluation and todays exam - 21 minutes  Logan Morgan M.D on 07/31/2016 at 5:06 PM    Note: This dictation was prepared with Dragon dictation along with smaller phrase technology. Any transcriptional errors that result from this process are unintentional.

## 2016-09-21 ENCOUNTER — Ambulatory Visit (INDEPENDENT_AMBULATORY_CARE_PROVIDER_SITE_OTHER): Payer: Medicare HMO | Admitting: Psychology

## 2016-09-21 DIAGNOSIS — F4310 Post-traumatic stress disorder, unspecified: Secondary | ICD-10-CM | POA: Diagnosis not present

## 2016-10-19 ENCOUNTER — Ambulatory Visit (INDEPENDENT_AMBULATORY_CARE_PROVIDER_SITE_OTHER): Payer: Medicare HMO | Admitting: Psychology

## 2016-10-19 DIAGNOSIS — F4310 Post-traumatic stress disorder, unspecified: Secondary | ICD-10-CM | POA: Diagnosis not present

## 2016-11-10 ENCOUNTER — Ambulatory Visit (INDEPENDENT_AMBULATORY_CARE_PROVIDER_SITE_OTHER): Payer: Medicare HMO | Admitting: Psychology

## 2016-11-10 DIAGNOSIS — F4310 Post-traumatic stress disorder, unspecified: Secondary | ICD-10-CM | POA: Diagnosis not present

## 2016-11-24 ENCOUNTER — Ambulatory Visit: Payer: Self-pay | Admitting: Psychology

## 2016-12-02 ENCOUNTER — Ambulatory Visit (INDEPENDENT_AMBULATORY_CARE_PROVIDER_SITE_OTHER): Payer: Medicare HMO | Admitting: Psychology

## 2016-12-02 DIAGNOSIS — F4310 Post-traumatic stress disorder, unspecified: Secondary | ICD-10-CM | POA: Diagnosis not present

## 2016-12-07 ENCOUNTER — Ambulatory Visit (INDEPENDENT_AMBULATORY_CARE_PROVIDER_SITE_OTHER): Payer: Medicare HMO | Admitting: Psychology

## 2016-12-07 DIAGNOSIS — F4310 Post-traumatic stress disorder, unspecified: Secondary | ICD-10-CM

## 2016-12-10 IMAGING — US US ABDOMEN LIMITED
1 series · 14 of 25 positions shown · non-contrast
Comparison: None.

CLINICAL DATA: Abnormal liver function tests.

EXAM:
ULTRASOUND ABDOMEN LIMITED RIGHT UPPER QUADRANT

[Series 1: us abdomen limited · 0.19mm/px · 14 of 49 slices shown]
[im 1/49]
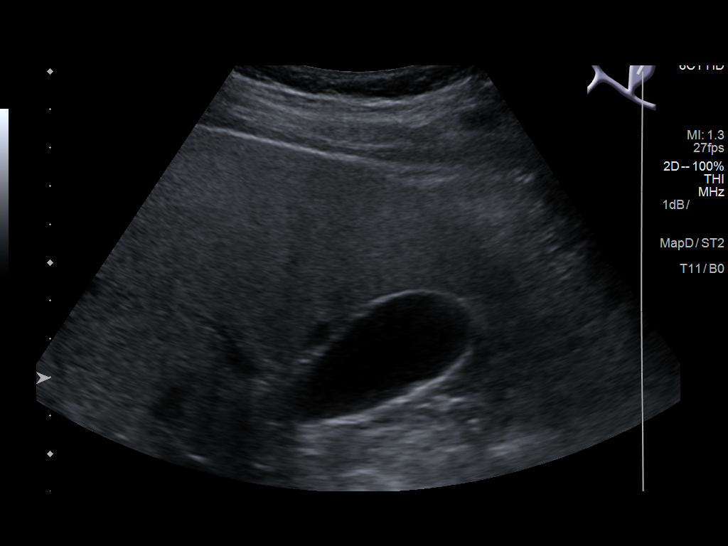
[im 5/49]
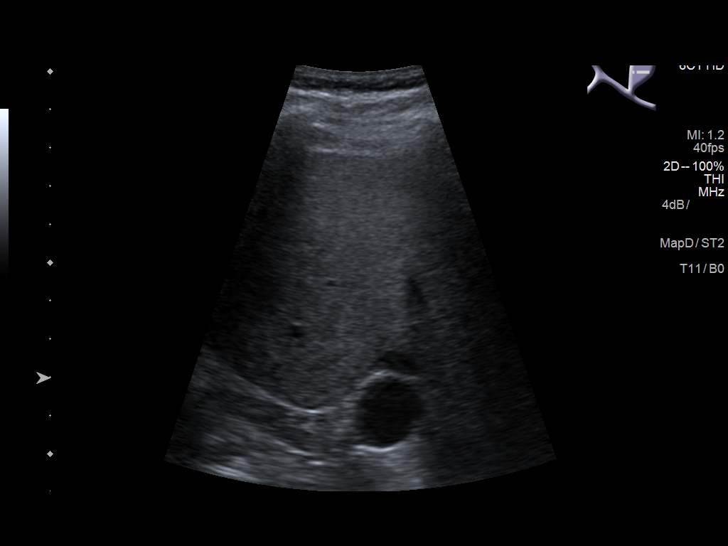
[im 9/49]
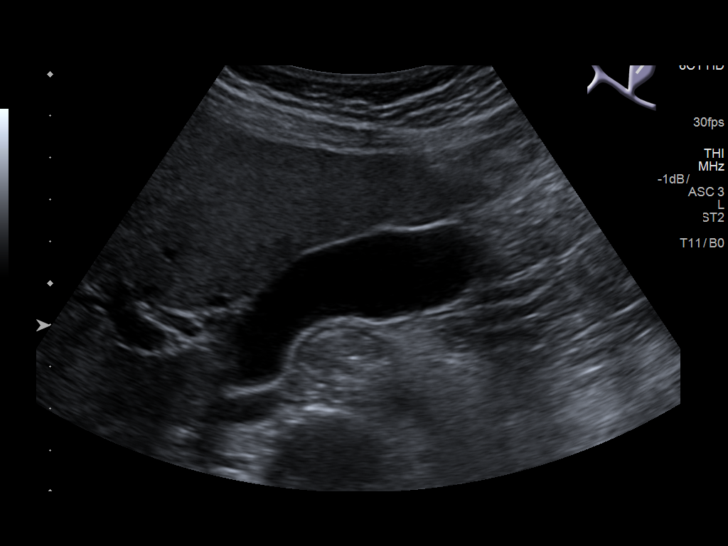
[im 13/49]
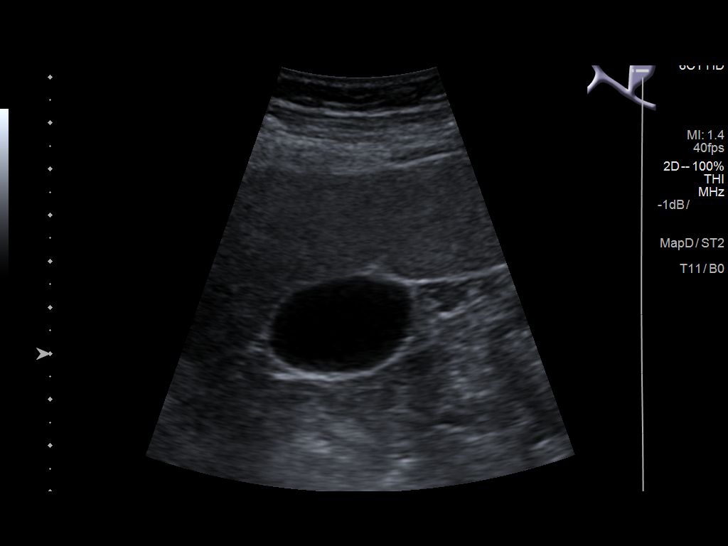
[im 17/49]
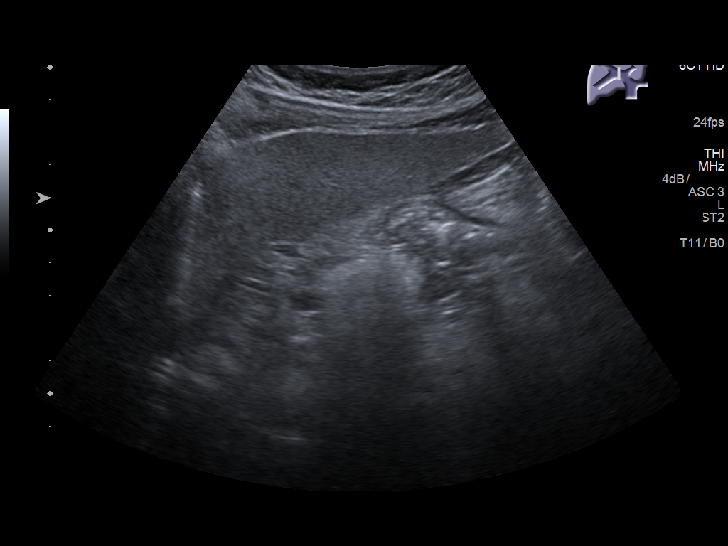
[im 19/49]
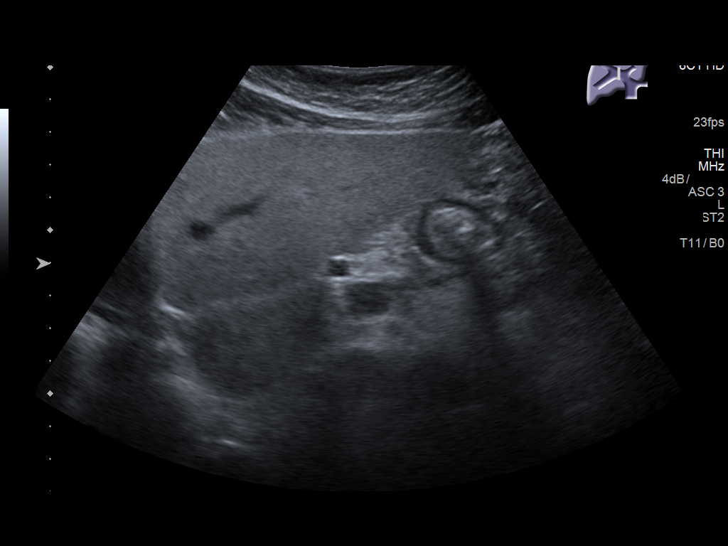
[im 23/49]
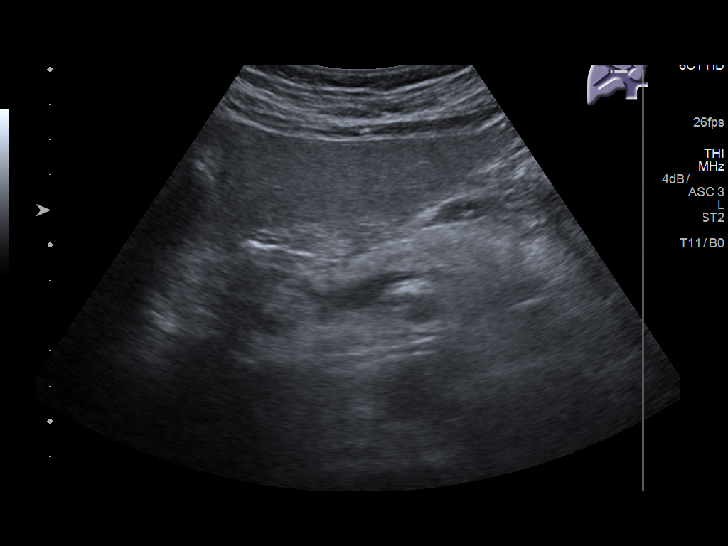
[im 27/49]
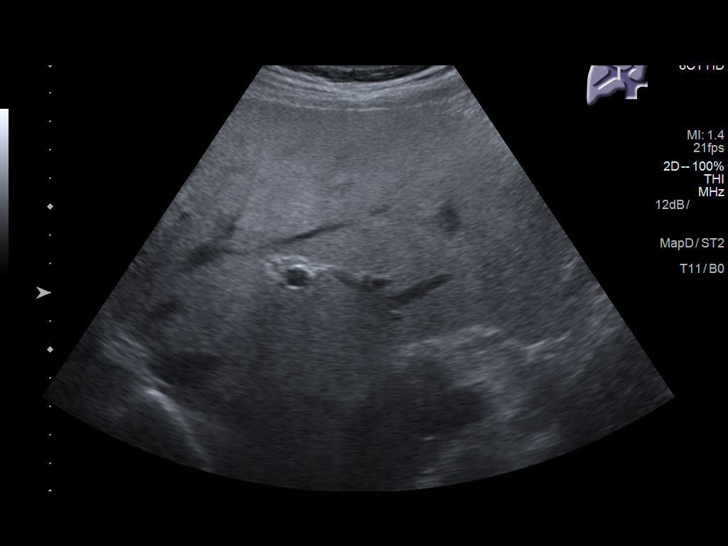
[im 31/49]
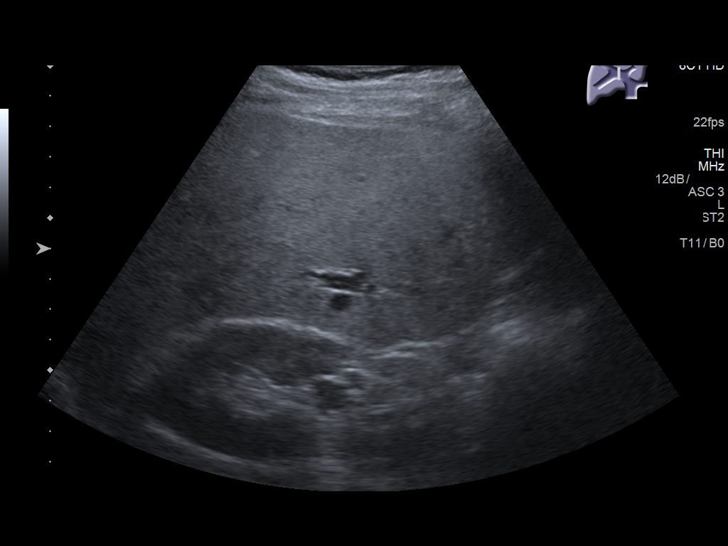
[im 33/49]
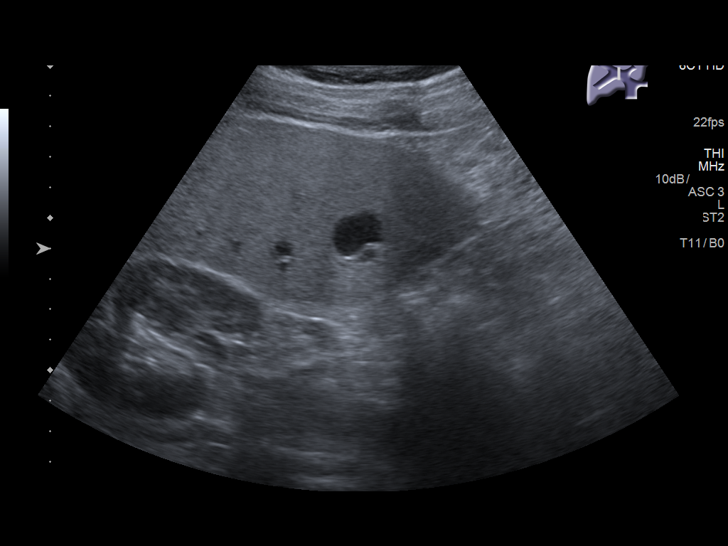
[im 37/49]
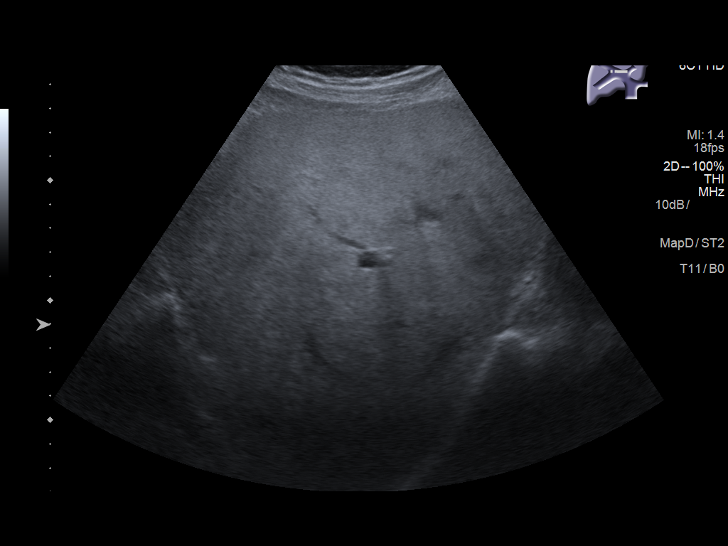
[im 41/49]
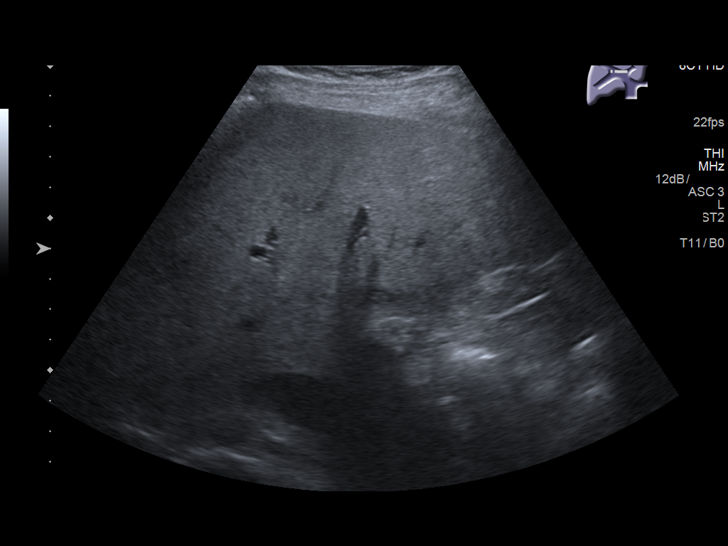
[im 45/49]
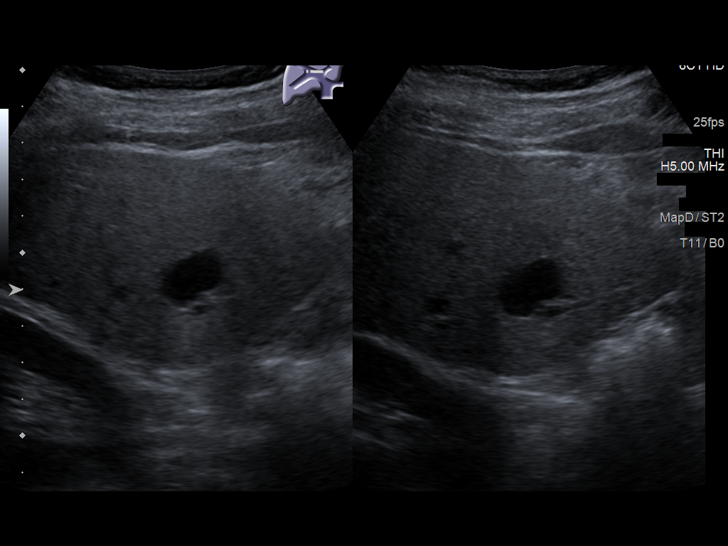
[im 49/49]
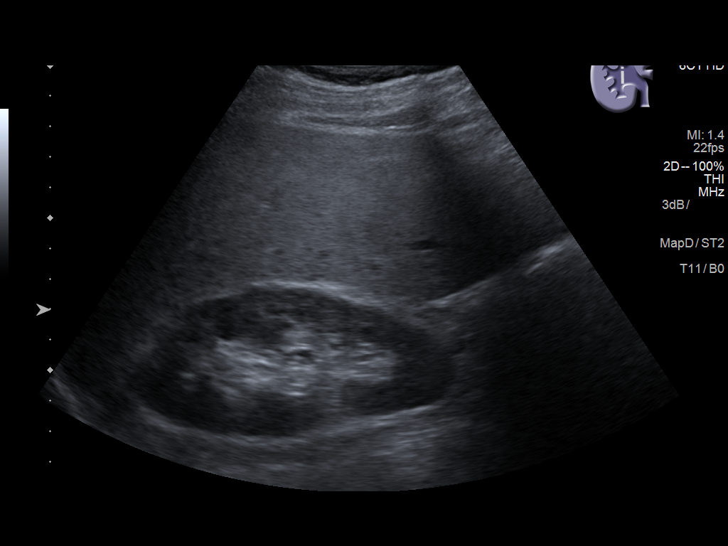

[14 of 25 positions shown; findings below may reference images not displayed]

FINDINGS: Gallbladder:

No gallstones or wall thickening visualized. No sonographic [place]
sign noted by sonographer.

Common bile duct:

Diameter: 5.5 mm which is within normal limits.

Liver:

Increased echogenicity of hepatic parenchyma is noted suggesting
fatty infiltration. 1.8 cm cyst is noted in right hepatic lobe.
IMPRESSION: Right hepatic cyst. Fatty infiltration of the liver. No other
abnormality seen in the right upper quadrant of the abdomen.

## 2016-12-14 ENCOUNTER — Other Ambulatory Visit: Payer: Self-pay | Admitting: Internal Medicine

## 2016-12-14 DIAGNOSIS — R945 Abnormal results of liver function studies: Secondary | ICD-10-CM

## 2016-12-14 DIAGNOSIS — R7989 Other specified abnormal findings of blood chemistry: Secondary | ICD-10-CM

## 2017-01-04 ENCOUNTER — Ambulatory Visit (INDEPENDENT_AMBULATORY_CARE_PROVIDER_SITE_OTHER): Payer: Medicare HMO | Admitting: Psychology

## 2017-01-04 DIAGNOSIS — F4310 Post-traumatic stress disorder, unspecified: Secondary | ICD-10-CM | POA: Diagnosis not present

## 2017-01-08 ENCOUNTER — Ambulatory Visit
Admission: RE | Admit: 2017-01-08 | Discharge: 2017-01-08 | Disposition: A | Discharge status: Home / Self Care | Payer: Medicare HMO | Source: Ambulatory Visit | Attending: Internal Medicine | Admitting: Internal Medicine

## 2017-01-08 DIAGNOSIS — K760 Fatty (change of) liver, not elsewhere classified: Secondary | ICD-10-CM | POA: Insufficient documentation

## 2017-01-08 DIAGNOSIS — R7989 Other specified abnormal findings of blood chemistry: Secondary | ICD-10-CM | POA: Diagnosis not present

## 2017-01-08 DIAGNOSIS — K7689 Other specified diseases of liver: Secondary | ICD-10-CM | POA: Insufficient documentation

## 2017-01-08 DIAGNOSIS — R945 Abnormal results of liver function studies: Secondary | ICD-10-CM

## 2017-02-17 ENCOUNTER — Ambulatory Visit (INDEPENDENT_AMBULATORY_CARE_PROVIDER_SITE_OTHER): Payer: Medicare HMO | Admitting: Psychology

## 2017-02-17 DIAGNOSIS — F4310 Post-traumatic stress disorder, unspecified: Secondary | ICD-10-CM | POA: Diagnosis not present

## 2017-03-03 ENCOUNTER — Ambulatory Visit (INDEPENDENT_AMBULATORY_CARE_PROVIDER_SITE_OTHER): Payer: Medicare HMO | Admitting: Psychology

## 2017-03-03 DIAGNOSIS — F4310 Post-traumatic stress disorder, unspecified: Secondary | ICD-10-CM | POA: Diagnosis not present

## 2017-03-17 ENCOUNTER — Ambulatory Visit (INDEPENDENT_AMBULATORY_CARE_PROVIDER_SITE_OTHER): Payer: Medicare HMO | Admitting: Psychology

## 2017-03-17 DIAGNOSIS — F4310 Post-traumatic stress disorder, unspecified: Secondary | ICD-10-CM

## 2017-03-31 ENCOUNTER — Ambulatory Visit (INDEPENDENT_AMBULATORY_CARE_PROVIDER_SITE_OTHER): Payer: Medicare HMO | Admitting: Psychology

## 2017-03-31 DIAGNOSIS — F4310 Post-traumatic stress disorder, unspecified: Secondary | ICD-10-CM

## 2017-04-14 ENCOUNTER — Ambulatory Visit: Payer: Self-pay | Admitting: Psychology

## 2017-04-21 ENCOUNTER — Ambulatory Visit (INDEPENDENT_AMBULATORY_CARE_PROVIDER_SITE_OTHER): Payer: Medicare HMO | Admitting: Psychology

## 2017-04-21 DIAGNOSIS — F4310 Post-traumatic stress disorder, unspecified: Secondary | ICD-10-CM

## 2017-04-28 ENCOUNTER — Ambulatory Visit: Payer: Self-pay | Admitting: Psychology

## 2017-05-12 ENCOUNTER — Ambulatory Visit (INDEPENDENT_AMBULATORY_CARE_PROVIDER_SITE_OTHER): Payer: Medicare HMO | Admitting: Psychology

## 2017-05-12 DIAGNOSIS — F4310 Post-traumatic stress disorder, unspecified: Secondary | ICD-10-CM

## 2017-05-26 ENCOUNTER — Ambulatory Visit (INDEPENDENT_AMBULATORY_CARE_PROVIDER_SITE_OTHER): Payer: Medicare HMO | Admitting: Psychology

## 2017-05-26 DIAGNOSIS — F4310 Post-traumatic stress disorder, unspecified: Secondary | ICD-10-CM

## 2017-06-09 ENCOUNTER — Ambulatory Visit (INDEPENDENT_AMBULATORY_CARE_PROVIDER_SITE_OTHER): Payer: Medicare HMO | Admitting: Psychology

## 2017-06-09 DIAGNOSIS — F4310 Post-traumatic stress disorder, unspecified: Secondary | ICD-10-CM

## 2017-06-23 ENCOUNTER — Ambulatory Visit: Payer: Self-pay | Admitting: Psychology

## 2017-07-07 ENCOUNTER — Ambulatory Visit (INDEPENDENT_AMBULATORY_CARE_PROVIDER_SITE_OTHER): Payer: Medicare HMO | Admitting: Psychology

## 2017-07-07 DIAGNOSIS — F4310 Post-traumatic stress disorder, unspecified: Secondary | ICD-10-CM

## 2017-07-21 ENCOUNTER — Ambulatory Visit: Payer: Self-pay | Admitting: Psychology

## 2017-07-28 ENCOUNTER — Ambulatory Visit (INDEPENDENT_AMBULATORY_CARE_PROVIDER_SITE_OTHER): Payer: Medicare HMO | Admitting: Psychology

## 2017-07-28 DIAGNOSIS — F4310 Post-traumatic stress disorder, unspecified: Secondary | ICD-10-CM

## 2017-08-04 ENCOUNTER — Ambulatory Visit (INDEPENDENT_AMBULATORY_CARE_PROVIDER_SITE_OTHER): Payer: Medicare HMO | Admitting: Psychology

## 2017-08-04 DIAGNOSIS — F4310 Post-traumatic stress disorder, unspecified: Secondary | ICD-10-CM

## 2017-08-18 ENCOUNTER — Ambulatory Visit (INDEPENDENT_AMBULATORY_CARE_PROVIDER_SITE_OTHER): Payer: Medicare HMO | Admitting: Psychology

## 2017-08-18 DIAGNOSIS — F4310 Post-traumatic stress disorder, unspecified: Secondary | ICD-10-CM | POA: Diagnosis not present

## 2017-09-03 ENCOUNTER — Ambulatory Visit (INDEPENDENT_AMBULATORY_CARE_PROVIDER_SITE_OTHER): Payer: Medicare HMO | Admitting: Psychology

## 2017-09-03 DIAGNOSIS — F4310 Post-traumatic stress disorder, unspecified: Secondary | ICD-10-CM

## 2017-09-15 ENCOUNTER — Ambulatory Visit (INDEPENDENT_AMBULATORY_CARE_PROVIDER_SITE_OTHER): Payer: Medicare HMO | Admitting: Psychology

## 2017-09-15 DIAGNOSIS — F4310 Post-traumatic stress disorder, unspecified: Secondary | ICD-10-CM

## 2017-09-29 ENCOUNTER — Ambulatory Visit (INDEPENDENT_AMBULATORY_CARE_PROVIDER_SITE_OTHER): Payer: Medicare HMO | Admitting: Psychology

## 2017-09-29 DIAGNOSIS — F4310 Post-traumatic stress disorder, unspecified: Secondary | ICD-10-CM | POA: Diagnosis not present

## 2017-10-13 ENCOUNTER — Ambulatory Visit (INDEPENDENT_AMBULATORY_CARE_PROVIDER_SITE_OTHER): Payer: Medicare HMO | Admitting: Psychology

## 2017-10-13 DIAGNOSIS — F4310 Post-traumatic stress disorder, unspecified: Secondary | ICD-10-CM

## 2017-10-27 ENCOUNTER — Ambulatory Visit (INDEPENDENT_AMBULATORY_CARE_PROVIDER_SITE_OTHER): Payer: Medicare HMO | Admitting: Psychology

## 2017-10-27 DIAGNOSIS — F4310 Post-traumatic stress disorder, unspecified: Secondary | ICD-10-CM

## 2017-11-10 ENCOUNTER — Ambulatory Visit (INDEPENDENT_AMBULATORY_CARE_PROVIDER_SITE_OTHER): Payer: Medicare HMO | Admitting: Psychology

## 2017-11-10 DIAGNOSIS — F4310 Post-traumatic stress disorder, unspecified: Secondary | ICD-10-CM

## 2017-11-22 ENCOUNTER — Ambulatory Visit (INDEPENDENT_AMBULATORY_CARE_PROVIDER_SITE_OTHER): Payer: Medicare HMO | Admitting: Psychology

## 2017-11-22 DIAGNOSIS — F4310 Post-traumatic stress disorder, unspecified: Secondary | ICD-10-CM

## 2017-11-24 ENCOUNTER — Ambulatory Visit: Payer: Medicare HMO | Admitting: Psychology

## 2017-12-08 ENCOUNTER — Ambulatory Visit (INDEPENDENT_AMBULATORY_CARE_PROVIDER_SITE_OTHER): Payer: Medicare HMO | Admitting: Psychology

## 2017-12-08 DIAGNOSIS — F4310 Post-traumatic stress disorder, unspecified: Secondary | ICD-10-CM

## 2017-12-14 ENCOUNTER — Telehealth: Payer: Self-pay | Admitting: *Deleted

## 2017-12-14 NOTE — Telephone Encounter (Signed)
Spoke with patient at length about eligibilty for lung screening CT scan. He quit smoking 22 years ago and does not meet eligibility due to that being >15 years.

## 2017-12-22 ENCOUNTER — Ambulatory Visit (INDEPENDENT_AMBULATORY_CARE_PROVIDER_SITE_OTHER): Payer: Medicare HMO | Admitting: Psychology

## 2017-12-22 DIAGNOSIS — F4310 Post-traumatic stress disorder, unspecified: Secondary | ICD-10-CM | POA: Diagnosis not present

## 2018-01-05 ENCOUNTER — Ambulatory Visit: Payer: Medicare HMO | Admitting: Psychology

## 2018-01-07 ENCOUNTER — Ambulatory Visit (INDEPENDENT_AMBULATORY_CARE_PROVIDER_SITE_OTHER): Payer: Medicare HMO | Admitting: Psychology

## 2018-01-07 DIAGNOSIS — F4310 Post-traumatic stress disorder, unspecified: Secondary | ICD-10-CM

## 2018-01-19 ENCOUNTER — Ambulatory Visit (INDEPENDENT_AMBULATORY_CARE_PROVIDER_SITE_OTHER): Payer: Medicare HMO | Admitting: Psychology

## 2018-01-19 ENCOUNTER — Other Ambulatory Visit: Payer: Self-pay

## 2018-01-19 ENCOUNTER — Emergency Department: Payer: Medicare HMO

## 2018-01-19 ENCOUNTER — Encounter: Payer: Self-pay | Admitting: Emergency Medicine

## 2018-01-19 ENCOUNTER — Emergency Department
Admission: EM | Admit: 2018-01-19 | Discharge: 2018-01-19 | Disposition: A | Discharge status: Home / Self Care | Payer: Medicare HMO | Attending: Emergency Medicine | Admitting: Emergency Medicine

## 2018-01-19 DIAGNOSIS — I10 Essential (primary) hypertension: Secondary | ICD-10-CM | POA: Insufficient documentation

## 2018-01-19 DIAGNOSIS — Z79899 Other long term (current) drug therapy: Secondary | ICD-10-CM | POA: Insufficient documentation

## 2018-01-19 DIAGNOSIS — F4310 Post-traumatic stress disorder, unspecified: Secondary | ICD-10-CM | POA: Diagnosis not present

## 2018-01-19 DIAGNOSIS — R079 Chest pain, unspecified: Secondary | ICD-10-CM | POA: Diagnosis not present

## 2018-01-19 DIAGNOSIS — J449 Chronic obstructive pulmonary disease, unspecified: Secondary | ICD-10-CM | POA: Insufficient documentation

## 2018-01-19 DIAGNOSIS — B029 Zoster without complications: Secondary | ICD-10-CM | POA: Diagnosis not present

## 2018-01-19 DIAGNOSIS — Z7901 Long term (current) use of anticoagulants: Secondary | ICD-10-CM | POA: Diagnosis not present

## 2018-01-19 DIAGNOSIS — Z87891 Personal history of nicotine dependence: Secondary | ICD-10-CM | POA: Insufficient documentation

## 2018-01-19 LAB — CBC
HEMATOCRIT: 46.1 % (ref 40.0–52.0)
HEMOGLOBIN: 16 g/dL (ref 13.0–18.0)
MCH: 32.8 pg (ref 26.0–34.0)
MCHC: 34.7 g/dL (ref 32.0–36.0)
MCV: 94.5 fL (ref 80.0–100.0)
Platelets: 185 10*3/uL (ref 150–440)
RBC: 4.87 MIL/uL (ref 4.40–5.90)
RDW: 14.1 % (ref 11.5–14.5)
WBC: 5.3 10*3/uL (ref 3.8–10.6)

## 2018-01-19 LAB — BASIC METABOLIC PANEL
Anion gap: 7 (ref 5–15)
BUN: 15 mg/dL (ref 8–23)
CHLORIDE: 107 mmol/L (ref 98–111)
CO2: 28 mmol/L (ref 22–32)
Calcium: 9.6 mg/dL (ref 8.9–10.3)
Creatinine, Ser: 0.86 mg/dL (ref 0.61–1.24)
GFR calc Af Amer: >60 mL/min (ref >60)
GFR calc non Af Amer: >60 mL/min (ref >60)
GLUCOSE: 103 mg/dL — AB (ref 70–99)
POTASSIUM: 4.2 mmol/L (ref 3.5–5.1)
Sodium: 142 mmol/L (ref 135–145)

## 2018-01-19 LAB — TROPONIN I: Troponin I: <0.03 ng/mL (ref <0.03)

## 2018-01-19 IMAGING — CR DG CHEST 2V
1 series · 2 of 2 positions shown · non-contrast
Comparison: [DATE]

CLINICAL DATA: Chest pain.

EXAM:
CHEST - 2 VIEW

[Series 1: dg chest 2 view · 0.14mm/px · 2 of 2 slices shown]
[im 1/2]
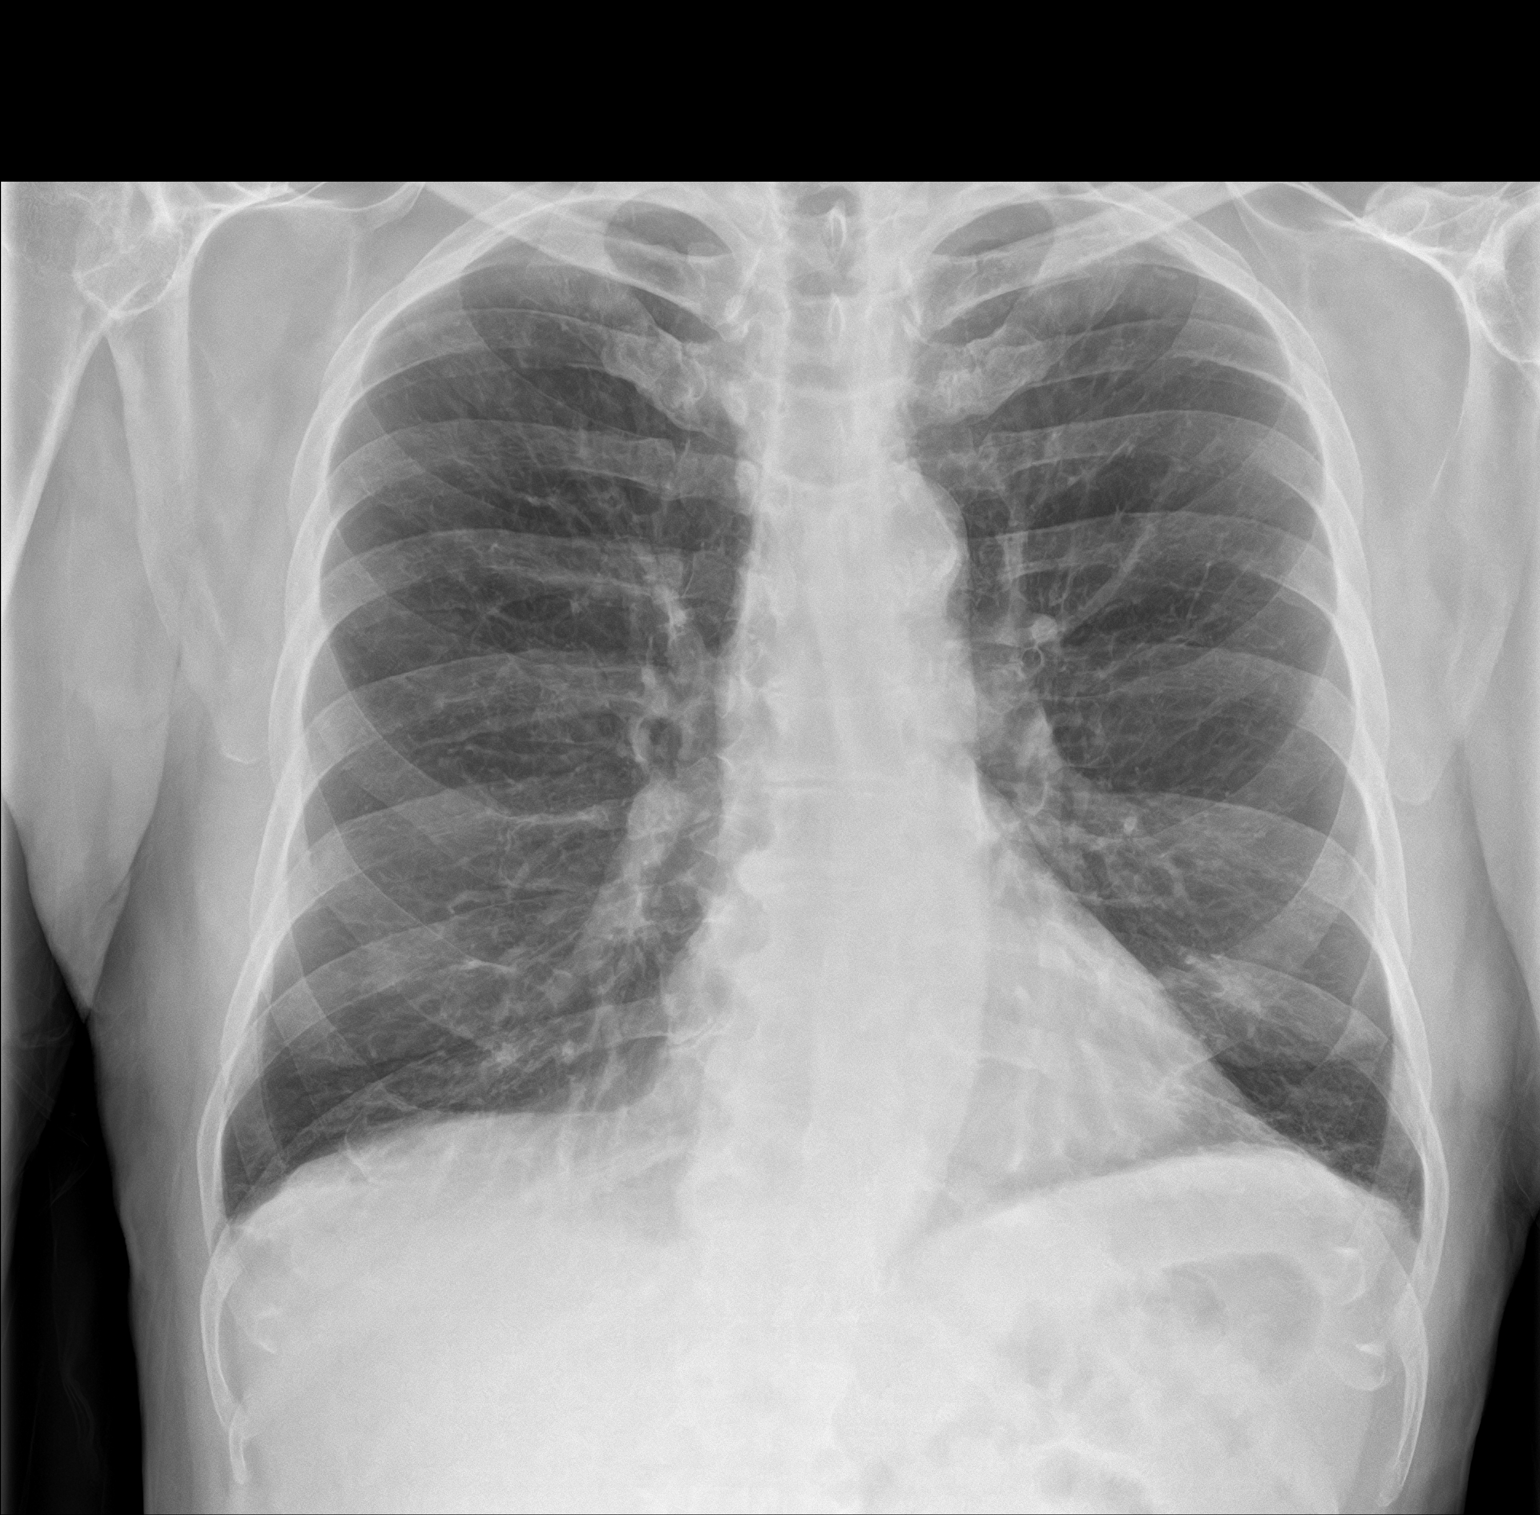
[im 2/2]
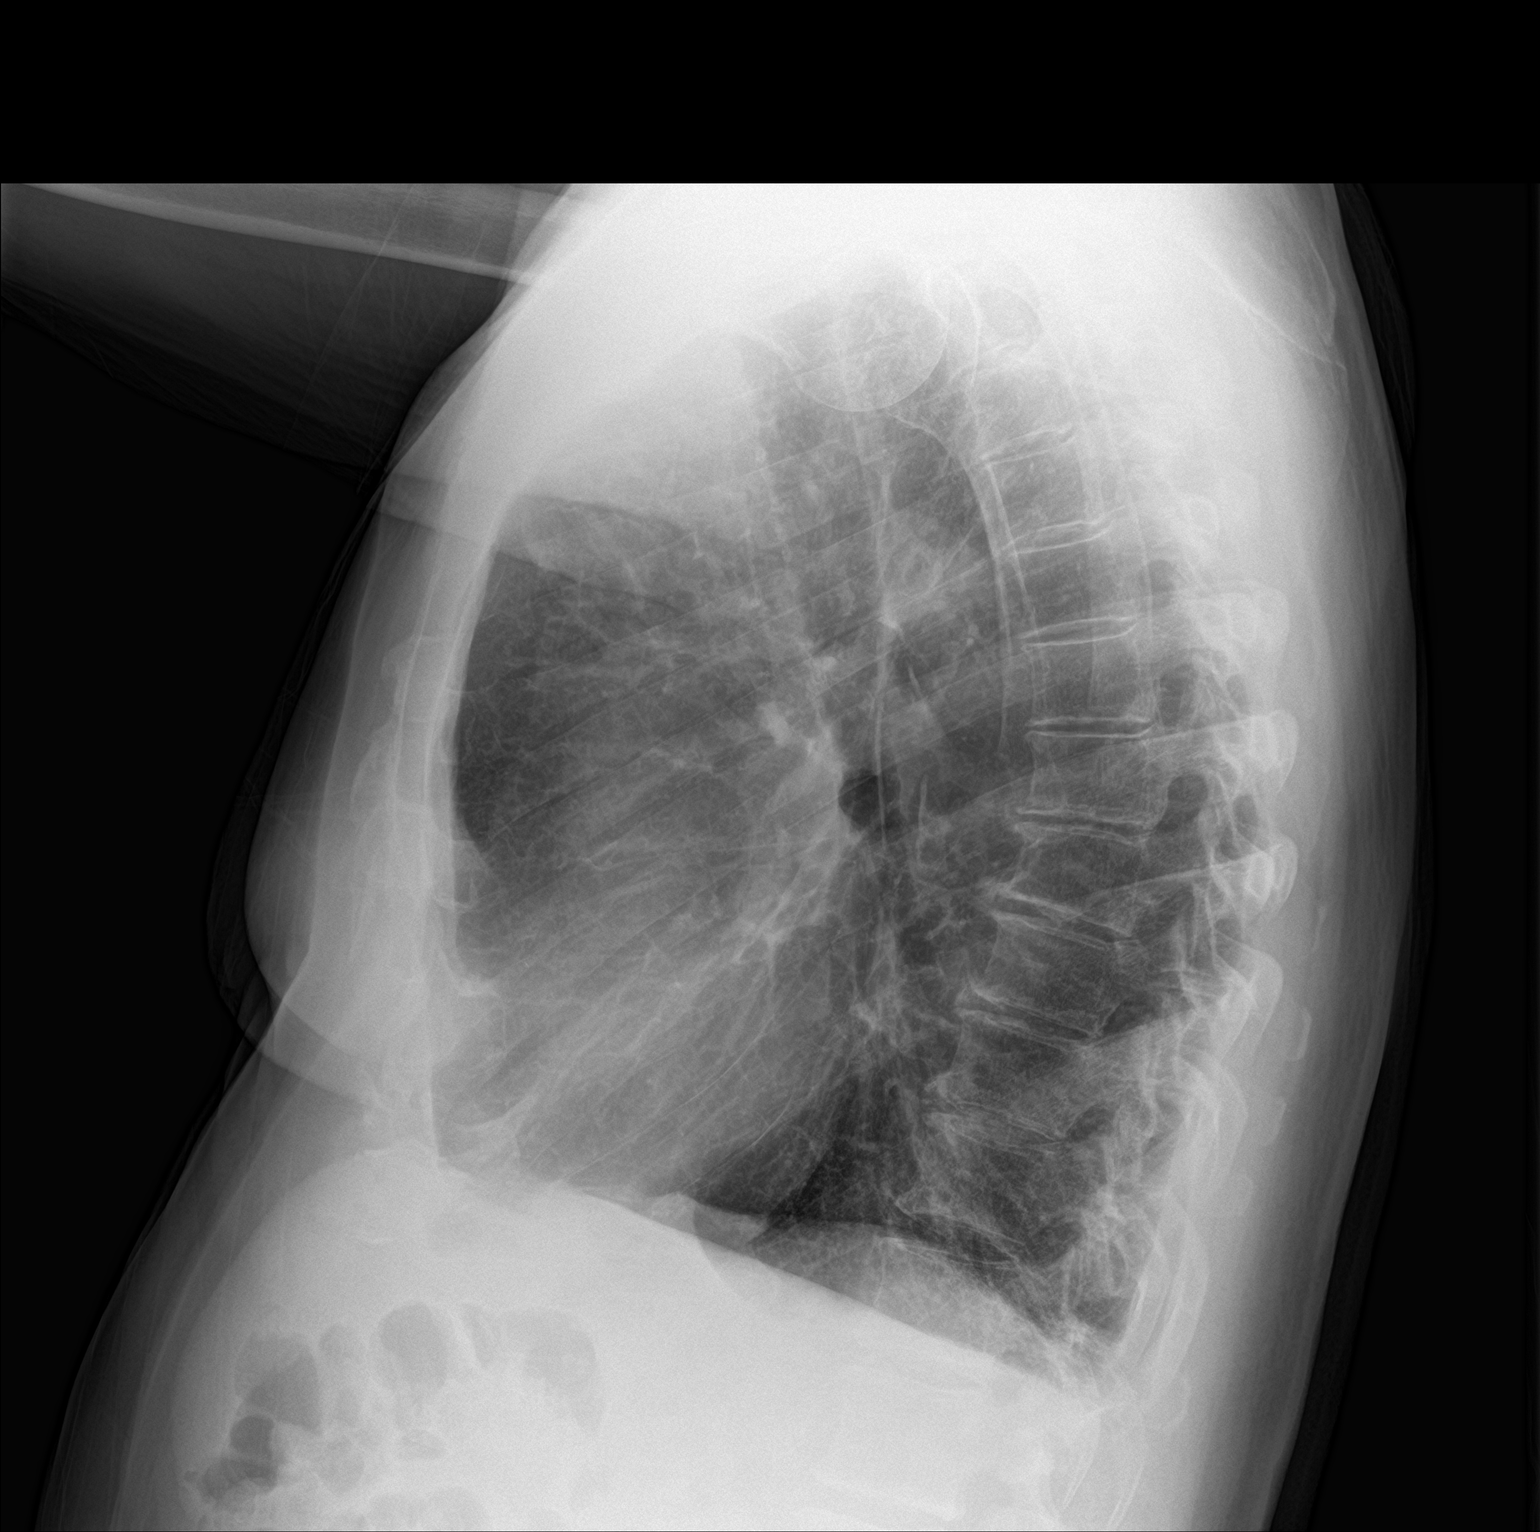

[2 of 2 positions shown; findings below may reference images not displayed]

FINDINGS: The heart size and mediastinal contours are within normal limits.
Both lungs are clear but slightly hyperinflated with flattening of
the diaphragm consistent with emphysema. No effusions. The
visualized skeletal structures are unremarkable.
IMPRESSION: No acute abnormality.

Emphysema ([TY]-[TY]).

## 2018-01-19 MED ORDER — VALACYCLOVIR HCL 1 G PO TABS: 1000.0000 mg | ORAL_TABLET | Freq: Three times a day (TID) | ORAL | 0 refills | Status: DC

## 2018-01-19 MED ORDER — VALACYCLOVIR HCL 500 MG PO TABS
1000.0000 mg | ORAL_TABLET | ORAL | Status: AC
  Administered 2018-01-19: 1000 mg via ORAL
  Filled 2018-01-19: qty 2

## 2018-01-19 NOTE — ED Notes (Addendum)
Pt reports right side pain in chest and upper abdomen.  Pt states pain radiates into right arm and mid back.   Former smoker.  No cough.  No sob.  No n/v.  Sx for 1 week. Pt states he feels bruised on the inside.  No abd pain or chest pain.

## 2018-01-19 NOTE — ED Provider Notes (Signed)
[facility] Emergency Department Provider Note   ____________________________________________   First MD Initiated Contact with Patient 01/19/18 1707     (approximate)  I have reviewed the triage vital signs and the nursing notes.   HISTORY  Chief Complaint Chest Pain    HPI Logan Morgan is a 77 y.o. male has a history of COPD previous arrhythmia  Patient reports for about 5 or 6 days now he is been experiencing a discomfort in his right armpit area and around his right ribs and it is pretty constant but sometimes when he moves just right it feels like an increased twinge.  Feels like it is a somewhat odd sensation.  No fevers or chills.  Denies shortness of breath.  Has no cough.  Reports this is a discomfort.  Not aware of any fall but feels like when it first started like a bit of a irritation or bruising of her rib feeling and seems to be persisting.  He is just not certain how to explain it.  Denies any left-sided chest pain.  No pain in the back or radiation the arms.  No fevers or chills.   Past Medical History:  Diagnosis Date  . COPD (chronic obstructive pulmonary disease) ([place])   . Dysrhythmia     Patient Active Problem List   Diagnosis Date Noted  . Ventricular tachyarrhythmia ([place]) 07/28/2016  . A-fib ([place]) 10/24/2014  . AF (paroxysmal atrial fibrillation) ([place]) 08/13/2014  . History of tachycardia 08/06/2014  . 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 Essential (primary) hypertension 06/13/2014  . Cancer of prostate ([place]) 06/13/2014  . Mixed hyperlipidemia 06/13/2014    History reviewed. No pertinent surgical history.  Prior to Admission medications   Medication Sig Start Date End Date Taking? Authorizing Provider  albuterol (PROVENTIL HFA;VENTOLIN HFA) 108 (90 Base) MCG/ACT inhaler Inhale 2 puffs into the lungs daily as needed for wheezing or shortness of breath.    [provider]  amiodarone  (PACERONE) 200 MG tablet Take 2 tablets (400 mg total) by mouth 2 (two) times daily. 07/31/16   Epifanio Lesches, MD  folic acid (FOLVITE) 062 MCG tablet Take 400 mcg by mouth daily.    [provider]  glucosamine-chondroitin 500-400 MG tablet Take 1 tablet by mouth 2 (two) times daily.     [provider]  losartan (COZAAR) 100 MG tablet Take 100 mg by mouth daily.    [provider]  metoprolol succinate (TOPROL-XL) 25 MG 24 hr tablet Take 25 mg by mouth daily.    [provider]  milk thistle 175 MG tablet Take 175 mg by mouth daily.    [provider]  Mometasone Furo-Formoterol Fum (DULERA IN) Inhale 2 puffs into the lungs daily as needed.    [provider]  mometasone-formoterol (DULERA) 100-5 MCG/ACT AERO Inhale into the lungs. 07/08/15 07/28/17  [provider]  Multiple Vitamin (ONE-A-DAY MENS PO) Take 1 tablet by mouth daily.    [provider]  potassium chloride SA (K-DUR,KLOR-CON) 20 MEQ tablet Take 1 tablet (20 mEq total) by mouth daily. 10/26/14   Teodoro Spray, MD  pravastatin (PRAVACHOL) 40 MG tablet Take 40 mg by mouth daily.    [provider]  rivaroxaban (XARELTO) 20 MG TABS tablet Take 20 mg by mouth daily with supper.    [provider]  sertraline (ZOLOFT) 50 MG tablet TAKE 1 TABLET ONCE DAILY 05/12/16   [provider]  tamsulosin (FLOMAX) 0.4  MG CAPS capsule Take 0.4 mg by mouth daily.    [provider]  tiotropium (SPIRIVA) 18 MCG inhalation capsule Place 18 mcg into inhaler and inhale daily.    [provider]  valACYclovir (VALTREX) 1000 MG tablet Take 1 tablet (1,000 mg total) by mouth 3 (three) times daily. 01/19/18   Delman Kitten, MD    Allergies Patient has no known allergies.  Family History  Problem Relation Age of Onset  . COPD Mother     Social History Social History   Tobacco Use  . Smoking status: Former Research scientist (life sciences)  . Smokeless  tobacco: Never Used  Substance Use Topics  . Alcohol use: Yes    Comment: vodka daily  . Drug use: No    Review of Systems Constitutional: No fever/chills Eyes: No visual changes. ENT: No sore throat. Cardiovascular: Denies chest pain symptoms more like a discomfort over the ribs and really seems to involve just one area or may be 1 or 2 ribs over the right side. Respiratory: Denies shortness of breath. Gastrointestinal: No abdominal pain.  No nausea, no vomiting.  No diarrhea.  No constipation. Genitourinary: Negative for dysuria. Musculoskeletal: Negative for back pain.  No leg swelling.  No history of any blood clots. Skin: Negative for rash. Neurological: Negative for headaches, focal weakness or numbness.    ____________________________________________   PHYSICAL EXAM:  VITAL SIGNS: ED Triage Vitals  Enc Vitals Group     BP 01/19/18 1355 (!) 148/83     Pulse Rate 01/19/18 1355 70     Resp 01/19/18 1355 18     Temp 01/19/18 1355 97.8 F (36.6 C)     Temp Source 01/19/18 1355 Oral     SpO2 01/19/18 1355 95 %     Weight 01/19/18 1349 165 lb (74.8 kg)     Height 01/19/18 1349 5\' 8"  (1.727 m)     Head Circumference --      Peak Flow --      Pain Score 01/19/18 1348 5     Pain Loc --      Pain Edu? --      Excl. in [place]? --     Constitutional: Alert and oriented. Well appearing and in no acute distress. Eyes: Conjunctivae are normal. Head: Atraumatic. Nose: No congestion/rhinnorhea. Mouth/Throat: Mucous membranes are moist. Neck: No stridor.   Cardiovascular: Normal rate, regular rhythm. Grossly normal heart sounds.  Good peripheral circulation.  On inspection the patient has a slightly papular rash, erythematous with a slight surrounding red base in a dermatomal distribution that.  Occasional over the thoracic region and wraps around involving a single dermatome under the right axilla.  He points and identifies this area as to the area of discomfort, but reports he  had not seen or noticed there was a rash but also states he cannot really see that area without a mirror and hand not looked. Respiratory: Normal respiratory effort.  No retractions. Lungs CTAB. Gastrointestinal: Soft and nontender. No distention. Musculoskeletal: No lower extremity tenderness nor edema. Neurologic:  Normal speech and language. No gross focal neurologic deficits are appreciated.  Skin:  Skin is warm, dry and intact. No rash noted. Psychiatric: Mood and affect are normal. Speech and behavior are normal.  ____________________________________________   LABS (all labs ordered are listed, but only abnormal results are displayed)  Labs Reviewed  BASIC METABOLIC PANEL - Abnormal; Notable for the following components:      Result Value   Glucose, Bld 103 (*)  All other components within normal limits  CBC  TROPONIN I   ____________________________________________  EKG  Reviewed and interpreted by me at 1345 Heart rate 79 QRS 95 QTc 465 Normal sinus rhythm, incomplete right bundle branch block. ____________________________________________  RADIOLOGY  Dg Chest 2 View  Result Date: 01/19/2018 CLINICAL DATA:  Chest pain. EXAM: CHEST - 2 VIEW COMPARISON:  07/30/2016 FINDINGS: The heart size and mediastinal contours are within normal limits. Both lungs are clear but slightly hyperinflated with flattening of the diaphragm consistent with emphysema. No effusions. The visualized skeletal structures are unremarkable. IMPRESSION: No acute abnormality. Emphysema (ICD10-J43.9). Electronically Signed   By: Lorriane Shire M.D.   On: 01/19/2018 15:51    ____________________________________________   PROCEDURES  Procedure(s) performed: None  Procedures  Critical Care performed: No  ____________________________________________   INITIAL IMPRESSION / ASSESSMENT AND PLAN / ED COURSE  Pertinent labs & imaging results that were available during my care of the patient were  reviewed by me and considered in my medical decision making (see chart for details).  Patient presents for a right-sided chest rib discomfort.  EKG labs reassuring and chest x-ray normal.  On clinical examination found to have a dermatomal rash consistent with shingles.  See media upload.  Atypical of coronary syndrome.  Not associated any dyspnea.  Clinical examination consistent with shingles.  His clinical examination and history appear to be consistent with shingles.  Involves a single dermatome without noted complication and his pain is only mild to moderate.  Discussed with the patient, comfortable and understanding of treatment plan.  Also discussed his elevated blood pressure and reports that runs normal when he is checked previous and has doctor's visits and suspect is likely from his feeling a bit stressed out recently.  Advised him to continue to follow his blood pressures and contact his physician if he notes him to continue to be elevated and he is in agreement.  Reviewed for drug interactions, none to noted in epic.  In addition reviewed [email or web address] drugs to avoid list for QT prolongation, Valtrex (valacyclovir) not denoted. No noted contraindication to Valtrex by review.   Return precautions and treatment recommendations and follow-up discussed with the patient who is agreeable with the plan.       ____________________________________________   FINAL CLINICAL IMPRESSION(S) / ED DIAGNOSES  Final diagnoses:  Herpes zoster without complication      NEW MEDICATIONS STARTED DURING THIS VISIT:  Discharge Medication List as of 01/19/2018  5:41 PM    START taking these medications   Details  valACYclovir (VALTREX) 1000 MG tablet Take 1 tablet (1,000 mg total) by mouth 3 (three) times daily., Starting Wed 01/19/2018, Print         Note:  This document was prepared using Dragon voice recognition software and may include unintentional dictation errors.     Delman Kitten,  MD 01/19/18 [phone]

## 2018-01-19 NOTE — ED Triage Notes (Signed)
Pt presents via POV with c/o of right sided rib pain that now radiates to the right side of his back. Pt denies any falls or injuries. Pt states that pain has progressively gotten worse today. Pt states that it is a sharp constant pain. Pt states that pain is not sensitive to touch and "feels deep". Pt states he took advil with no relief. Pt in NAD at this time.

## 2018-01-19 NOTE — ED Notes (Addendum)
Labs ordered and called lab to add on to blood down there. Spoke with [facility]

## 2018-02-02 ENCOUNTER — Ambulatory Visit (INDEPENDENT_AMBULATORY_CARE_PROVIDER_SITE_OTHER): Payer: Medicare HMO | Admitting: Psychology

## 2018-02-02 DIAGNOSIS — F4310 Post-traumatic stress disorder, unspecified: Secondary | ICD-10-CM | POA: Diagnosis not present

## 2018-02-16 ENCOUNTER — Ambulatory Visit (INDEPENDENT_AMBULATORY_CARE_PROVIDER_SITE_OTHER): Payer: Medicare HMO | Admitting: Psychology

## 2018-02-16 DIAGNOSIS — F4310 Post-traumatic stress disorder, unspecified: Secondary | ICD-10-CM | POA: Diagnosis not present

## 2018-03-02 ENCOUNTER — Ambulatory Visit (INDEPENDENT_AMBULATORY_CARE_PROVIDER_SITE_OTHER): Payer: Medicare HMO | Admitting: Psychology

## 2018-03-02 DIAGNOSIS — F4310 Post-traumatic stress disorder, unspecified: Secondary | ICD-10-CM

## 2018-03-16 ENCOUNTER — Ambulatory Visit: Payer: Medicare HMO | Admitting: Psychology

## 2018-03-17 ENCOUNTER — Ambulatory Visit (INDEPENDENT_AMBULATORY_CARE_PROVIDER_SITE_OTHER): Payer: Medicare HMO | Admitting: Psychology

## 2018-03-17 DIAGNOSIS — F4310 Post-traumatic stress disorder, unspecified: Secondary | ICD-10-CM | POA: Diagnosis not present

## 2018-03-30 ENCOUNTER — Ambulatory Visit (INDEPENDENT_AMBULATORY_CARE_PROVIDER_SITE_OTHER): Payer: Medicare HMO | Admitting: Psychology

## 2018-03-30 DIAGNOSIS — F4310 Post-traumatic stress disorder, unspecified: Secondary | ICD-10-CM | POA: Diagnosis not present

## 2018-04-13 ENCOUNTER — Ambulatory Visit (INDEPENDENT_AMBULATORY_CARE_PROVIDER_SITE_OTHER): Payer: Medicare HMO | Admitting: Psychology

## 2018-04-13 DIAGNOSIS — F4310 Post-traumatic stress disorder, unspecified: Secondary | ICD-10-CM | POA: Diagnosis not present

## 2018-04-27 ENCOUNTER — Ambulatory Visit (INDEPENDENT_AMBULATORY_CARE_PROVIDER_SITE_OTHER): Payer: Medicare HMO | Admitting: Psychology

## 2018-04-27 DIAGNOSIS — F4310 Post-traumatic stress disorder, unspecified: Secondary | ICD-10-CM | POA: Diagnosis not present

## 2018-05-11 ENCOUNTER — Ambulatory Visit (INDEPENDENT_AMBULATORY_CARE_PROVIDER_SITE_OTHER): Payer: Medicare HMO | Admitting: Psychology

## 2018-05-11 DIAGNOSIS — F4310 Post-traumatic stress disorder, unspecified: Secondary | ICD-10-CM

## 2018-05-25 ENCOUNTER — Ambulatory Visit (INDEPENDENT_AMBULATORY_CARE_PROVIDER_SITE_OTHER): Payer: Medicare HMO | Admitting: Psychology

## 2018-05-25 DIAGNOSIS — F4310 Post-traumatic stress disorder, unspecified: Secondary | ICD-10-CM | POA: Diagnosis not present

## 2018-06-09 ENCOUNTER — Ambulatory Visit (INDEPENDENT_AMBULATORY_CARE_PROVIDER_SITE_OTHER): Payer: Medicare HMO | Admitting: Psychology

## 2018-06-09 DIAGNOSIS — F4310 Post-traumatic stress disorder, unspecified: Secondary | ICD-10-CM

## 2018-06-23 ENCOUNTER — Ambulatory Visit (INDEPENDENT_AMBULATORY_CARE_PROVIDER_SITE_OTHER): Payer: Medicare HMO | Admitting: Psychology

## 2018-06-23 DIAGNOSIS — F4310 Post-traumatic stress disorder, unspecified: Secondary | ICD-10-CM

## 2018-07-07 ENCOUNTER — Ambulatory Visit (INDEPENDENT_AMBULATORY_CARE_PROVIDER_SITE_OTHER): Payer: Medicare HMO | Admitting: Psychology

## 2018-07-07 DIAGNOSIS — F4310 Post-traumatic stress disorder, unspecified: Secondary | ICD-10-CM

## 2018-07-21 ENCOUNTER — Ambulatory Visit (INDEPENDENT_AMBULATORY_CARE_PROVIDER_SITE_OTHER): Payer: Medicare HMO | Admitting: Psychology

## 2018-07-21 DIAGNOSIS — F4310 Post-traumatic stress disorder, unspecified: Secondary | ICD-10-CM | POA: Diagnosis not present

## 2018-08-02 ENCOUNTER — Ambulatory Visit (INDEPENDENT_AMBULATORY_CARE_PROVIDER_SITE_OTHER): Payer: Medicare HMO | Admitting: Psychology

## 2018-08-02 DIAGNOSIS — F4310 Post-traumatic stress disorder, unspecified: Secondary | ICD-10-CM

## 2018-08-03 ENCOUNTER — Ambulatory Visit: Payer: Medicare HMO | Admitting: Psychology

## 2018-08-04 ENCOUNTER — Ambulatory Visit: Payer: Medicare HMO | Admitting: Psychology

## 2018-08-16 ENCOUNTER — Ambulatory Visit (INDEPENDENT_AMBULATORY_CARE_PROVIDER_SITE_OTHER): Payer: Medicare HMO | Admitting: Psychology

## 2018-08-16 DIAGNOSIS — F4310 Post-traumatic stress disorder, unspecified: Secondary | ICD-10-CM

## 2018-08-18 ENCOUNTER — Ambulatory Visit (INDEPENDENT_AMBULATORY_CARE_PROVIDER_SITE_OTHER): Payer: Medicare HMO | Admitting: Psychology

## 2018-08-18 DIAGNOSIS — F4310 Post-traumatic stress disorder, unspecified: Secondary | ICD-10-CM | POA: Diagnosis not present

## 2018-08-23 ENCOUNTER — Ambulatory Visit (INDEPENDENT_AMBULATORY_CARE_PROVIDER_SITE_OTHER): Payer: Medicare HMO | Admitting: Psychology

## 2018-08-23 DIAGNOSIS — F4310 Post-traumatic stress disorder, unspecified: Secondary | ICD-10-CM | POA: Diagnosis not present

## 2018-08-29 ENCOUNTER — Ambulatory Visit (INDEPENDENT_AMBULATORY_CARE_PROVIDER_SITE_OTHER): Payer: Medicare HMO | Admitting: Psychology

## 2018-08-29 DIAGNOSIS — F4310 Post-traumatic stress disorder, unspecified: Secondary | ICD-10-CM

## 2018-09-01 ENCOUNTER — Ambulatory Visit (INDEPENDENT_AMBULATORY_CARE_PROVIDER_SITE_OTHER): Payer: Medicare HMO | Admitting: Psychology

## 2018-09-01 ENCOUNTER — Ambulatory Visit: Payer: Medicare HMO | Admitting: Psychology

## 2018-09-01 DIAGNOSIS — F4310 Post-traumatic stress disorder, unspecified: Secondary | ICD-10-CM | POA: Diagnosis not present

## 2018-09-12 ENCOUNTER — Ambulatory Visit (INDEPENDENT_AMBULATORY_CARE_PROVIDER_SITE_OTHER): Payer: Medicare HMO | Admitting: Psychology

## 2018-09-12 DIAGNOSIS — F4310 Post-traumatic stress disorder, unspecified: Secondary | ICD-10-CM

## 2018-09-15 ENCOUNTER — Ambulatory Visit (INDEPENDENT_AMBULATORY_CARE_PROVIDER_SITE_OTHER): Payer: Medicare HMO | Admitting: Psychology

## 2018-09-15 DIAGNOSIS — F4310 Post-traumatic stress disorder, unspecified: Secondary | ICD-10-CM | POA: Diagnosis not present

## 2018-09-29 ENCOUNTER — Ambulatory Visit (INDEPENDENT_AMBULATORY_CARE_PROVIDER_SITE_OTHER): Payer: Medicare HMO | Admitting: Psychology

## 2018-09-29 DIAGNOSIS — F4310 Post-traumatic stress disorder, unspecified: Secondary | ICD-10-CM

## 2018-10-13 ENCOUNTER — Ambulatory Visit (INDEPENDENT_AMBULATORY_CARE_PROVIDER_SITE_OTHER): Payer: Medicare HMO | Admitting: Psychology

## 2018-10-13 DIAGNOSIS — F4310 Post-traumatic stress disorder, unspecified: Secondary | ICD-10-CM

## 2018-10-27 ENCOUNTER — Ambulatory Visit (INDEPENDENT_AMBULATORY_CARE_PROVIDER_SITE_OTHER): Payer: Medicare HMO | Admitting: Psychology

## 2018-10-27 DIAGNOSIS — F4310 Post-traumatic stress disorder, unspecified: Secondary | ICD-10-CM

## 2018-11-10 ENCOUNTER — Ambulatory Visit (INDEPENDENT_AMBULATORY_CARE_PROVIDER_SITE_OTHER): Payer: Medicare HMO | Admitting: Psychology

## 2018-11-10 DIAGNOSIS — F4310 Post-traumatic stress disorder, unspecified: Secondary | ICD-10-CM

## 2018-11-24 ENCOUNTER — Ambulatory Visit (INDEPENDENT_AMBULATORY_CARE_PROVIDER_SITE_OTHER): Payer: Medicare HMO | Admitting: Psychology

## 2018-11-24 DIAGNOSIS — F4310 Post-traumatic stress disorder, unspecified: Secondary | ICD-10-CM

## 2018-12-08 ENCOUNTER — Ambulatory Visit (INDEPENDENT_AMBULATORY_CARE_PROVIDER_SITE_OTHER): Payer: Medicare HMO | Admitting: Psychology

## 2018-12-08 DIAGNOSIS — F4310 Post-traumatic stress disorder, unspecified: Secondary | ICD-10-CM | POA: Diagnosis not present

## 2018-12-22 ENCOUNTER — Ambulatory Visit (INDEPENDENT_AMBULATORY_CARE_PROVIDER_SITE_OTHER): Payer: Medicare HMO | Admitting: Psychology

## 2018-12-22 DIAGNOSIS — F4310 Post-traumatic stress disorder, unspecified: Secondary | ICD-10-CM | POA: Diagnosis not present

## 2019-01-05 ENCOUNTER — Ambulatory Visit (INDEPENDENT_AMBULATORY_CARE_PROVIDER_SITE_OTHER): Payer: Medicare HMO | Admitting: Psychology

## 2019-01-05 DIAGNOSIS — F4310 Post-traumatic stress disorder, unspecified: Secondary | ICD-10-CM | POA: Diagnosis not present

## 2019-01-19 ENCOUNTER — Ambulatory Visit (INDEPENDENT_AMBULATORY_CARE_PROVIDER_SITE_OTHER): Payer: Medicare HMO | Admitting: Psychology

## 2019-01-19 DIAGNOSIS — F4310 Post-traumatic stress disorder, unspecified: Secondary | ICD-10-CM

## 2019-02-02 ENCOUNTER — Ambulatory Visit: Payer: Medicare HMO | Admitting: Psychology

## 2019-02-11 ENCOUNTER — Emergency Department: Payer: Medicare HMO

## 2019-02-11 ENCOUNTER — Emergency Department
Admission: EM | Admit: 2019-02-11 | Discharge: 2019-02-11 | Disposition: A | Discharge status: Home / Self Care | Payer: Medicare HMO | Attending: Emergency Medicine | Admitting: Emergency Medicine

## 2019-02-11 ENCOUNTER — Other Ambulatory Visit: Payer: Self-pay

## 2019-02-11 DIAGNOSIS — I480 Paroxysmal atrial fibrillation: Secondary | ICD-10-CM | POA: Insufficient documentation

## 2019-02-11 DIAGNOSIS — Z79899 Other long term (current) drug therapy: Secondary | ICD-10-CM | POA: Diagnosis not present

## 2019-02-11 DIAGNOSIS — S0101XA Laceration without foreign body of scalp, initial encounter: Secondary | ICD-10-CM | POA: Diagnosis present

## 2019-02-11 DIAGNOSIS — W19XXXA Unspecified fall, initial encounter: Secondary | ICD-10-CM | POA: Insufficient documentation

## 2019-02-11 DIAGNOSIS — Y939 Activity, unspecified: Secondary | ICD-10-CM | POA: Diagnosis not present

## 2019-02-11 DIAGNOSIS — Y999 Unspecified external cause status: Secondary | ICD-10-CM | POA: Diagnosis not present

## 2019-02-11 DIAGNOSIS — Z87891 Personal history of nicotine dependence: Secondary | ICD-10-CM | POA: Insufficient documentation

## 2019-02-11 DIAGNOSIS — J449 Chronic obstructive pulmonary disease, unspecified: Secondary | ICD-10-CM | POA: Insufficient documentation

## 2019-02-11 DIAGNOSIS — Y929 Unspecified place or not applicable: Secondary | ICD-10-CM | POA: Diagnosis not present

## 2019-02-11 DIAGNOSIS — I10 Essential (primary) hypertension: Secondary | ICD-10-CM | POA: Insufficient documentation

## 2019-02-11 IMAGING — CT CT CERVICAL SPINE W/O CM
4 of 7 series · 16 of 33 positions shown, 17 images · non-contrast
Comparison: None.

CLINICAL DATA: Fall.  Laceration right lateral posterior scalp

EXAM:
CT HEAD WITHOUT CONTRAST
CT CERVICAL SPINE WITHOUT CONTRAST
TECHNIQUE: Multidetector CT imaging of the head and cervical spine was
performed following the standard protocol without intravenous
contrast. Multiplanar CT image reconstructions of the cervical spine
were also generated.

[Series 4: coronal soft tissue · coronal · 0.32mm/px · 3 of 69 slices shown]
[im 18/69  bone]
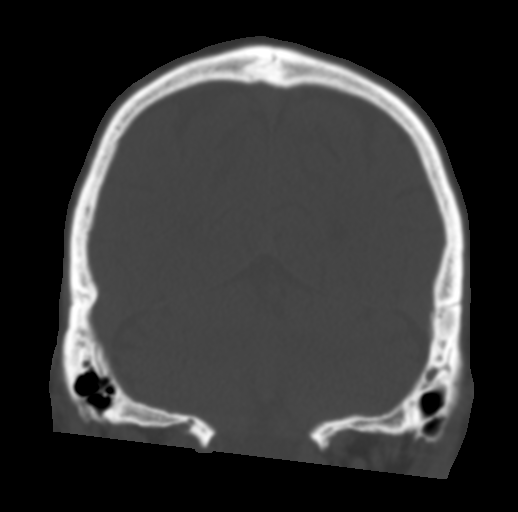
[im 35/69  bone]
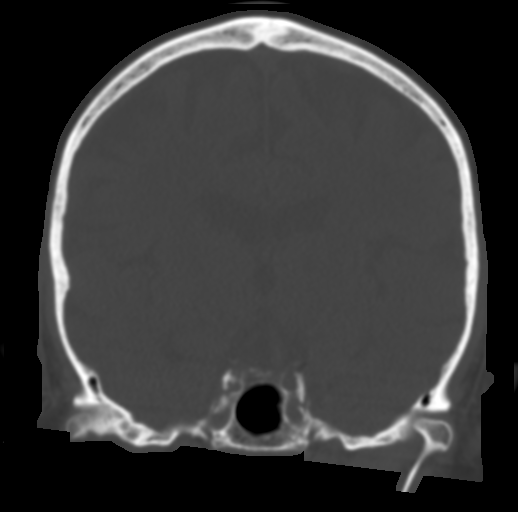
[im 52/69  bone]
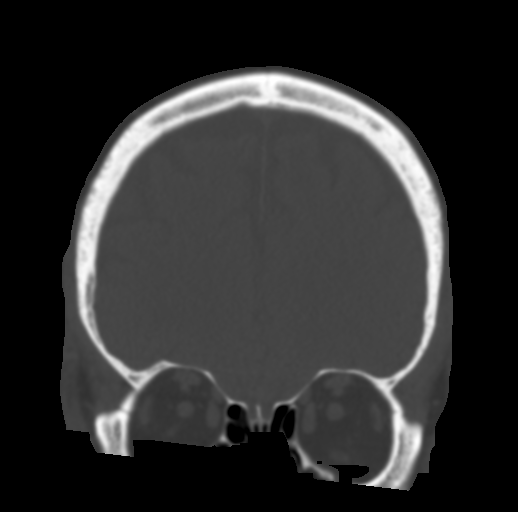

[Series 7: c spine soft · axial · 0.36mm/px · z∈[+22,+138]mm · 4 of 98 slices shown]
[im 20/98  soft-tissue]
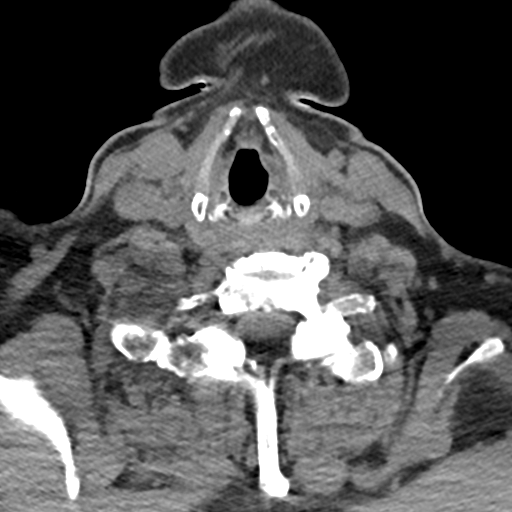
[im 39/98  soft-tissue]
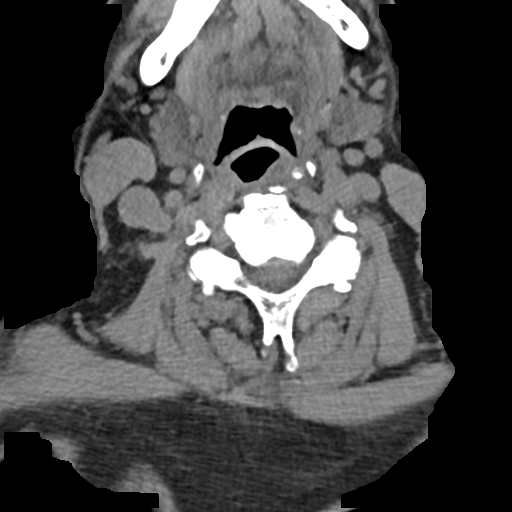
[im 59/98  soft-tissue]
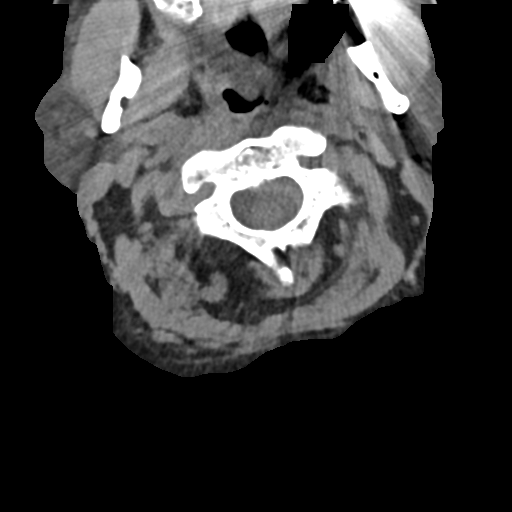
[im 78/98  soft-tissue]
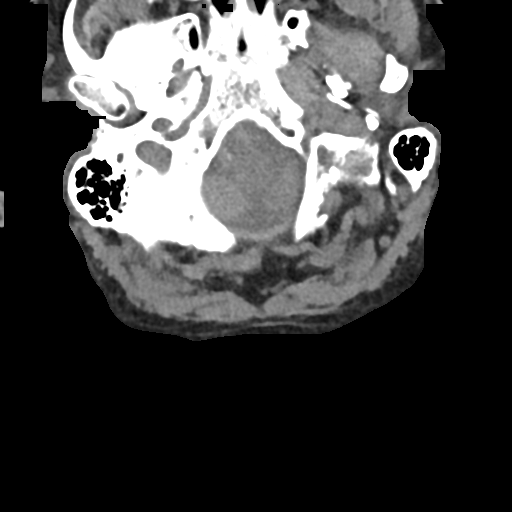

[Series 8: sagittal bone · sagittal · 0.27mm/px · 5 of 62 slices shown]
[im 11/62  bone]
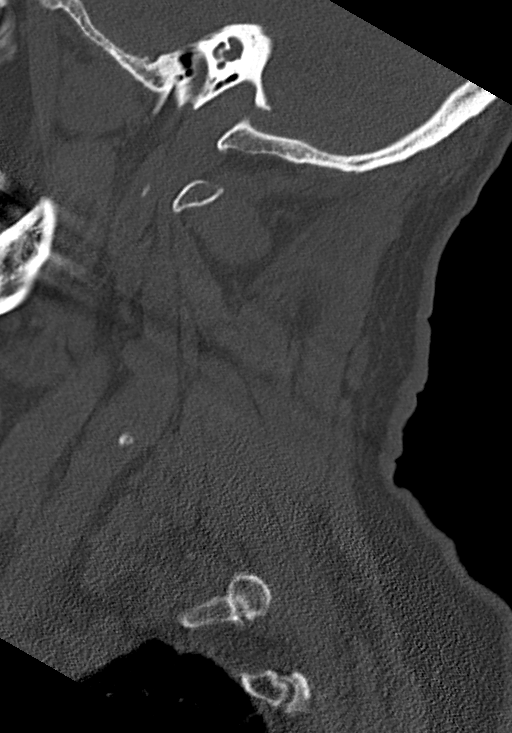
[im 21/62  bone]
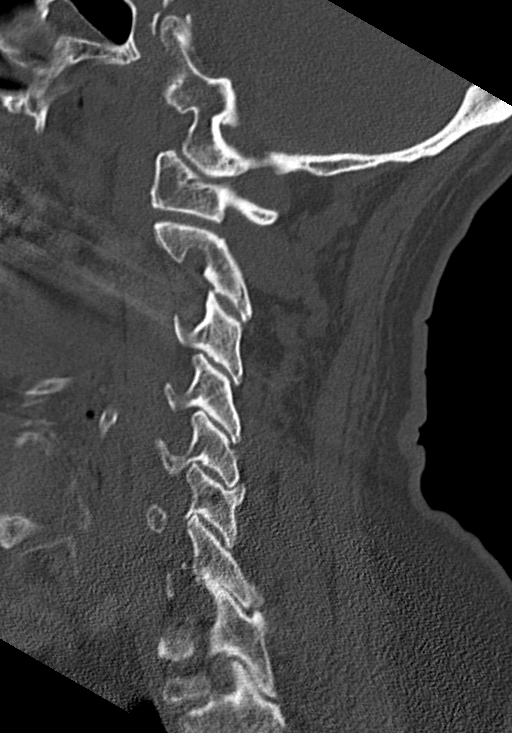
[im 31/62  bone]
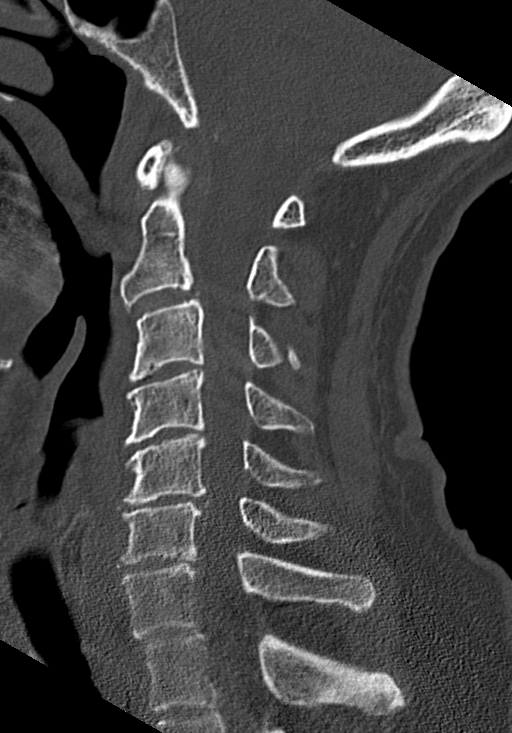
[im 41/62  bone]
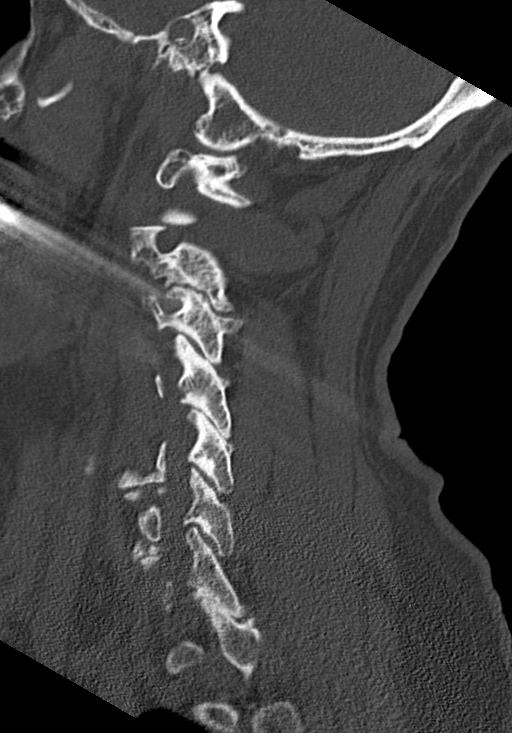
[im 51/62  bone]
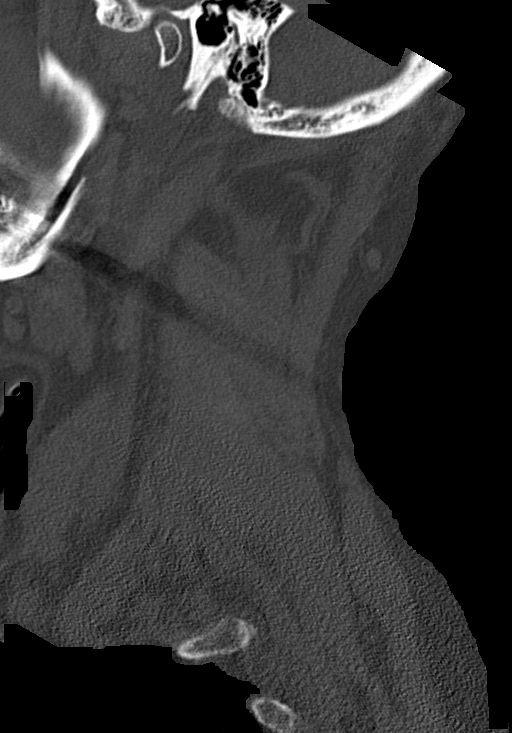

[Series 10: orthogonal bone · axial · 0.24mm/px · z∈[-2,+100]mm · 4 of 100 slices shown, 5 images]
[im 20/100  soft-tissue]
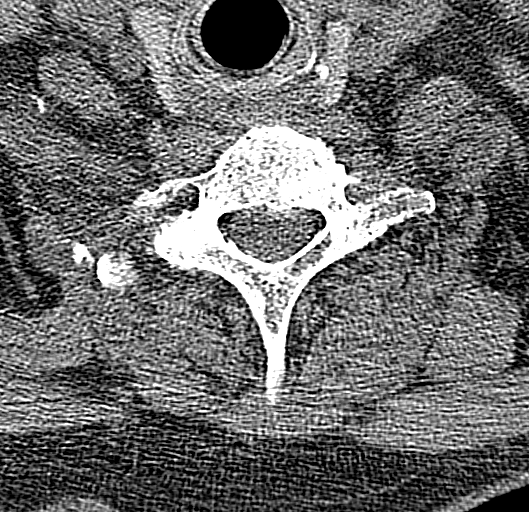
[im 20/100  bone]
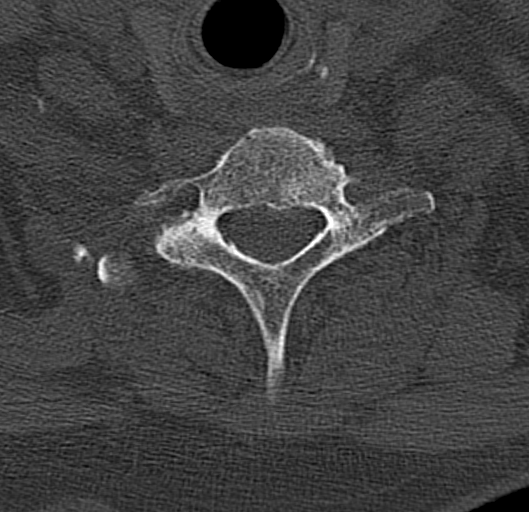
[im 40/100  bone]
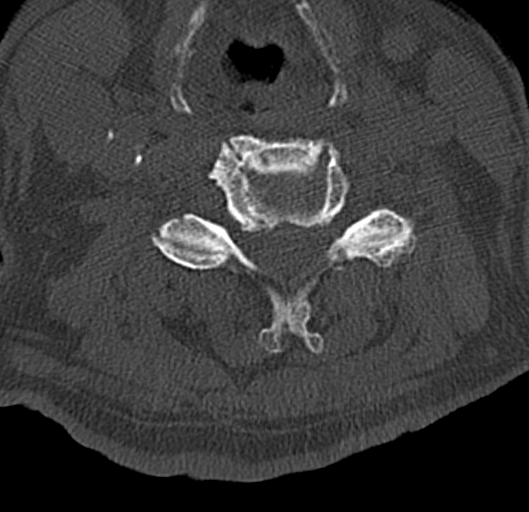
[im 60/100  bone]
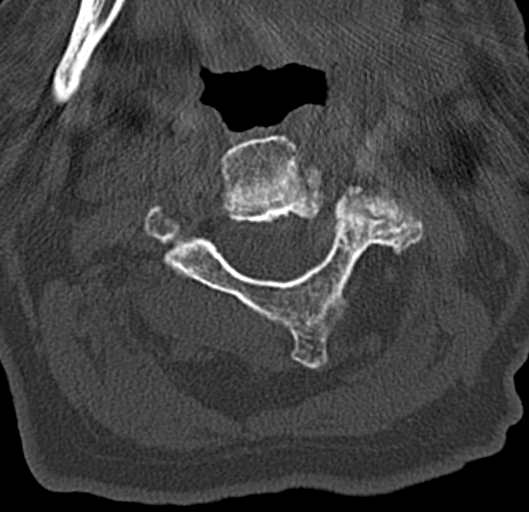
[im 80/100  bone]
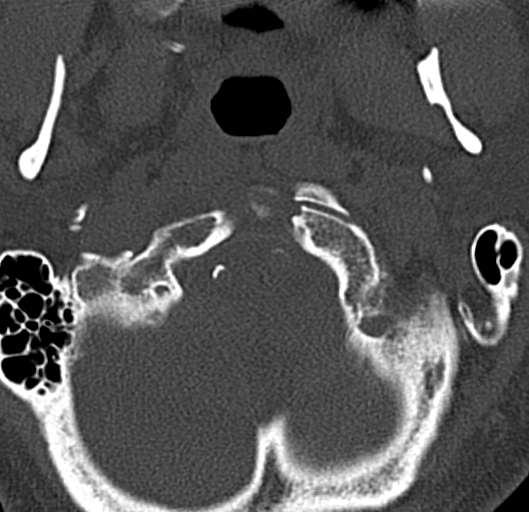

[16 of 33 positions shown; findings below may reference images not displayed]

FINDINGS: CT HEAD FINDINGS

Brain: There is atrophy and chronic small vessel disease changes. No
acute intracranial abnormality. Specifically, no hemorrhage,
hydrocephalus, mass lesion, acute infarction, or significant
intracranial injury.

Vascular: No hyperdense vessel or unexpected calcification.

Skull: No acute calvarial abnormality.

Sinuses/Orbits: Visualized paranasal sinuses and mastoids clear.
Orbital soft tissues unremarkable.

Other: None

CT CERVICAL SPINE FINDINGS

Alignment: Normal

Skull base and vertebrae: No acute fracture. No primary bone lesion
or focal pathologic process.

Soft tissues and spinal canal: No prevertebral fluid or swelling. No
visible canal hematoma.

Disc levels:  Diffuse degenerative disc and facet disease.

Upper chest: No acute findings

Other: None
IMPRESSION: Atrophy, chronic microvascular disease.

No acute intracranial abnormality.

Cervical spondylosis.  No acute bony abnormality.

## 2019-02-11 IMAGING — CT CT HEAD W/O CM
5 of 7 series · 18 of 47 positions shown, 19 images · non-contrast
Comparison: None.

CLINICAL DATA: Fall.  Laceration right lateral posterior scalp

EXAM:
CT HEAD WITHOUT CONTRAST
CT CERVICAL SPINE WITHOUT CONTRAST
TECHNIQUE: Multidetector CT imaging of the head and cervical spine was
performed following the standard protocol without intravenous
contrast. Multiplanar CT image reconstructions of the cervical spine
were also generated.

[Series 2: head wo · axial · 0.43mm/px · z∈[+191,+241]mm · 2 of 32 slices shown, 3 images]
[im 11/32  brain]
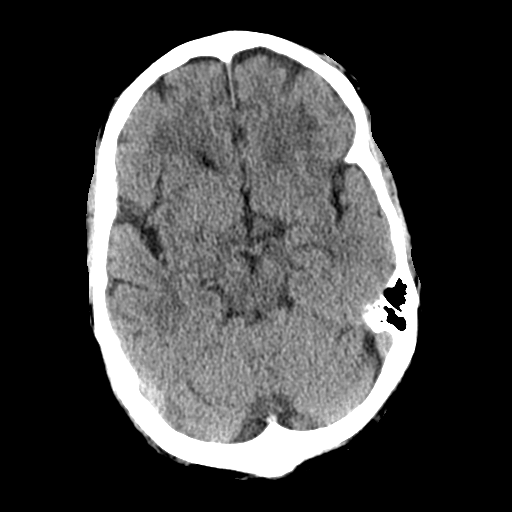
[im 11/32  bone]
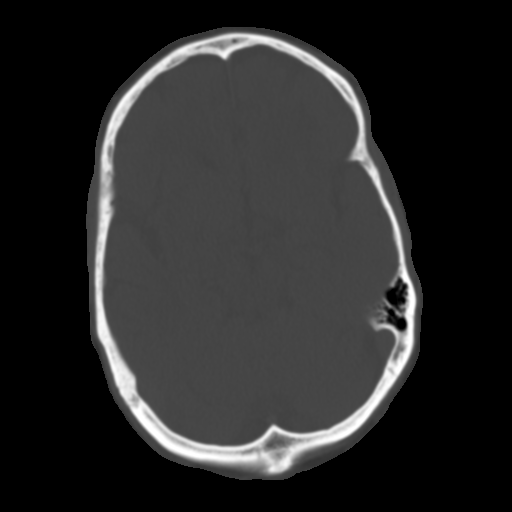
[im 21/32  brain]
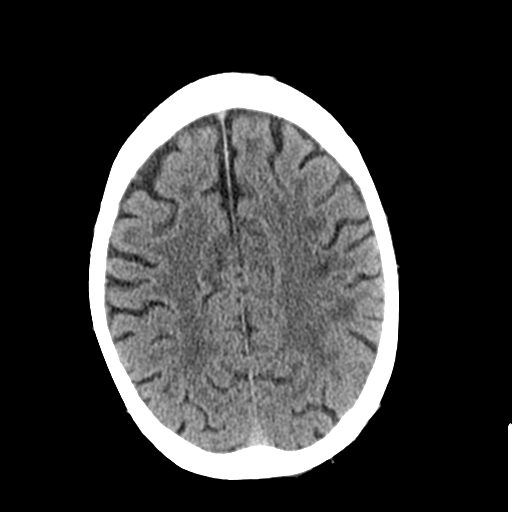

[Series 4: coronal soft tissue · coronal · 0.32mm/px · 3 of 69 slices shown]
[im 20/69  brain]
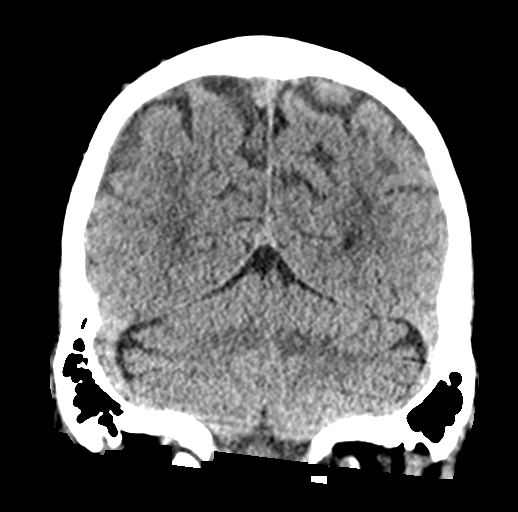
[im 30/69  brain]
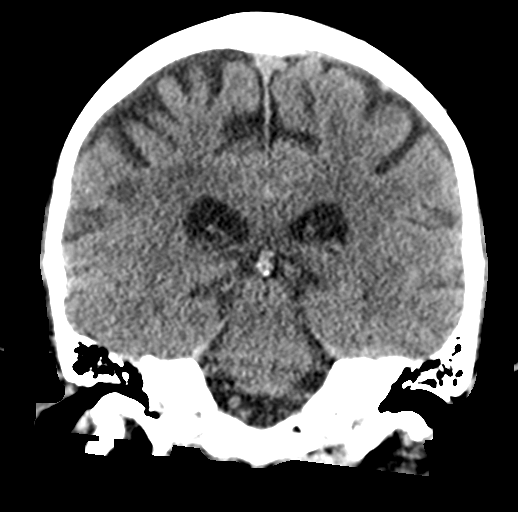
[im 39/69  brain]
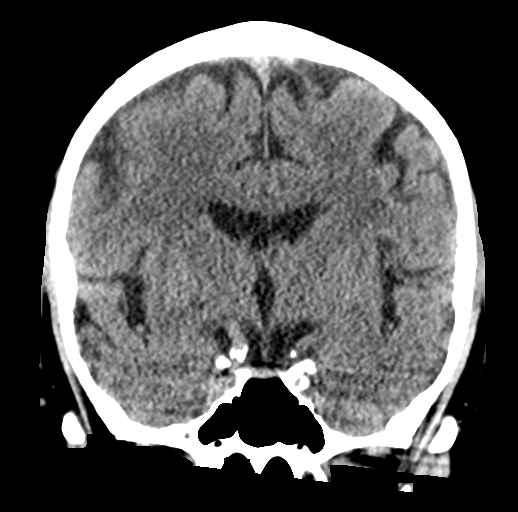

[Series 5: sagittal soft tissue · sagittal · 0.33mm/px · 1 of 52 slices shown]
[im 26/52  brain]
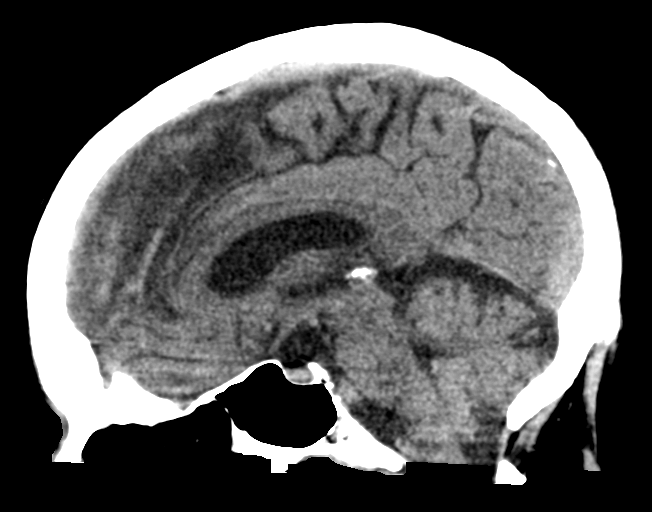

[Series 7: c spine soft · axial · 0.36mm/px · z∈[+0,+64]mm · 4 of 98 slices shown]
[im 9/98  brain]
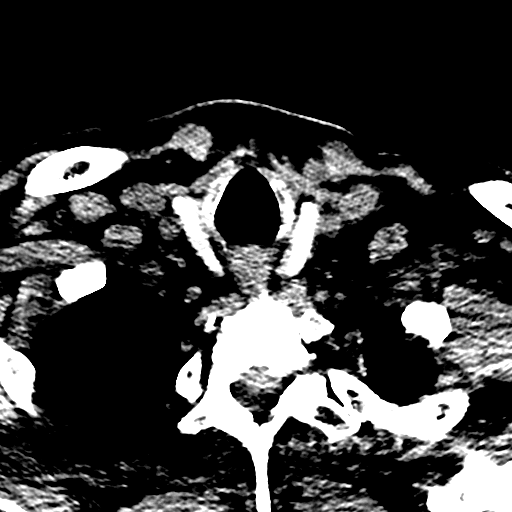
[im 25/98  brain]
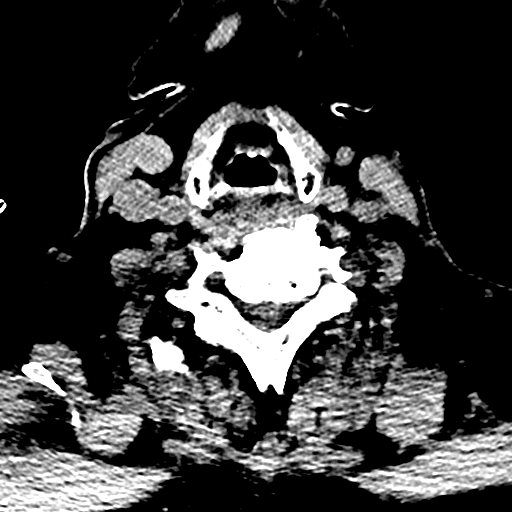
[im 33/98  brain]
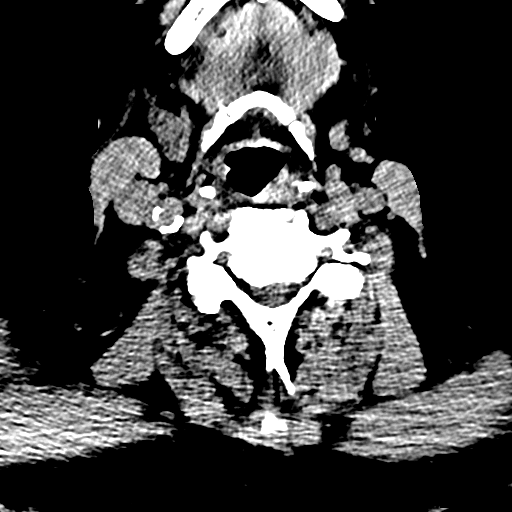
[im 41/98  brain]
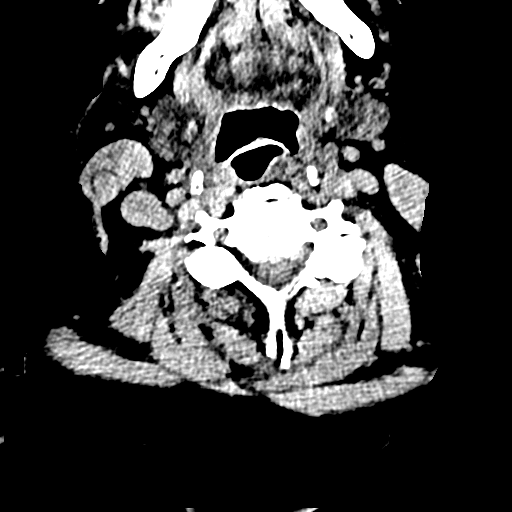

[Series 10: orthogonal bone · axial · 0.24mm/px · z∈[-21,+119]mm · 8 of 100 slices shown]
[im 9/100  bone]
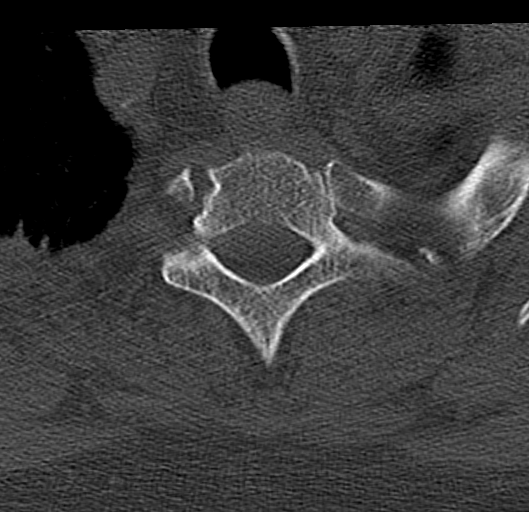
[im 25/100  bone]
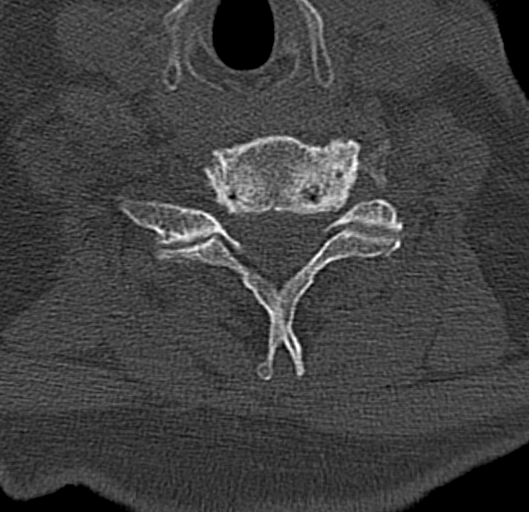
[im 34/100  bone]
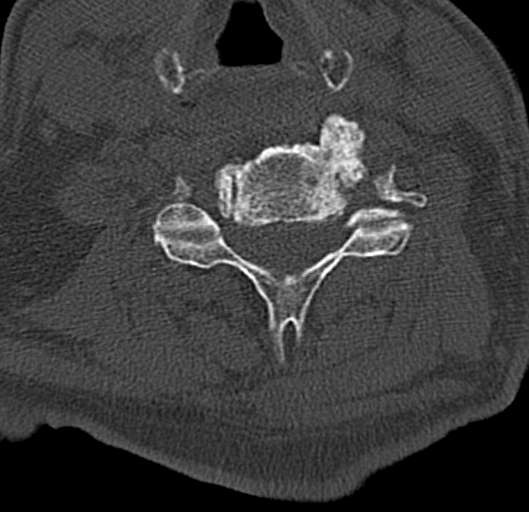
[im 42/100  bone]
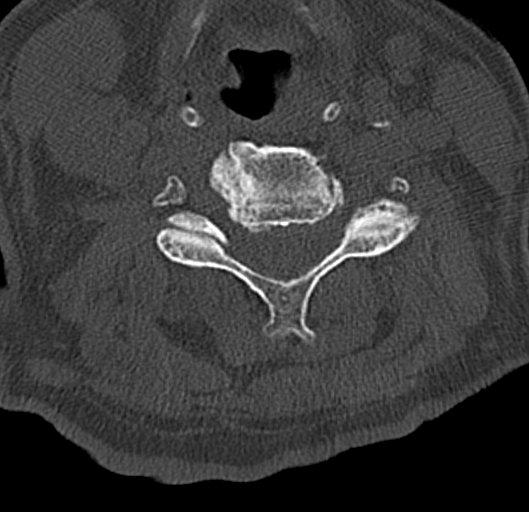
[im 58/100  bone]
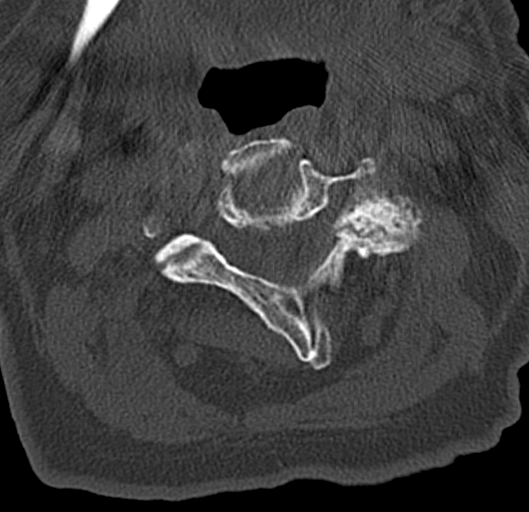
[im 67/100  bone]
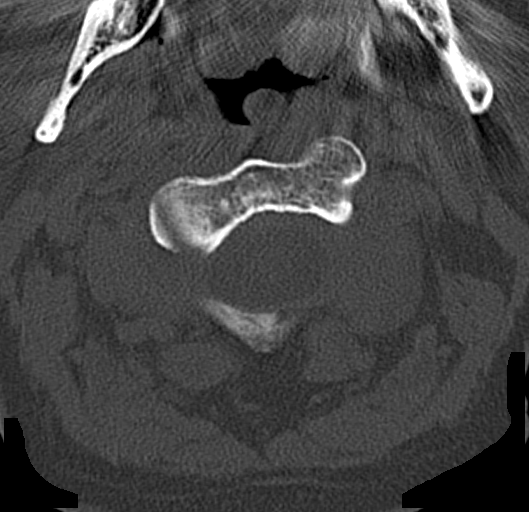
[im 75/100  bone]
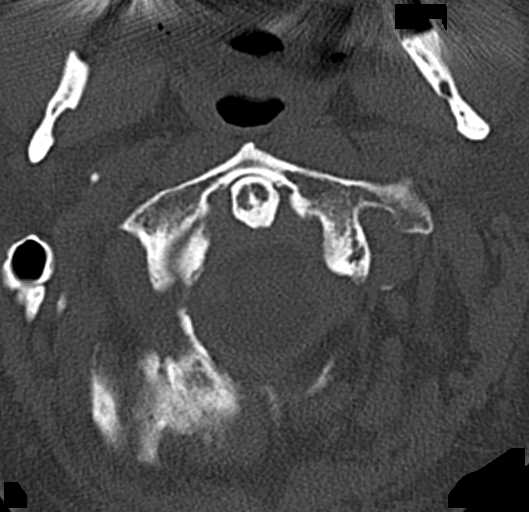
[im 91/100  bone]
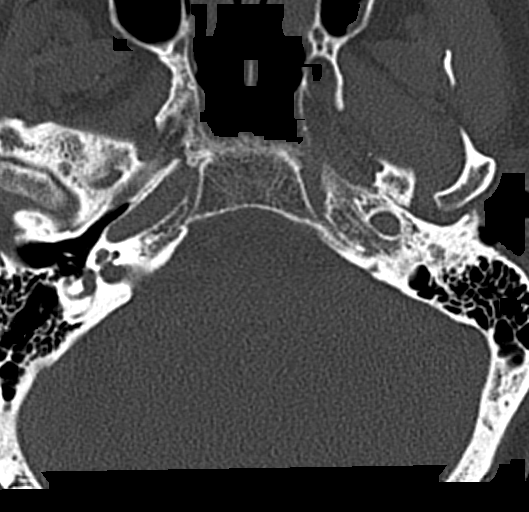

[18 of 47 positions shown; findings below may reference images not displayed]

FINDINGS: CT HEAD FINDINGS

Brain: There is atrophy and chronic small vessel disease changes. No
acute intracranial abnormality. Specifically, no hemorrhage,
hydrocephalus, mass lesion, acute infarction, or significant
intracranial injury.

Vascular: No hyperdense vessel or unexpected calcification.

Skull: No acute calvarial abnormality.

Sinuses/Orbits: Visualized paranasal sinuses and mastoids clear.
Orbital soft tissues unremarkable.

Other: None

CT CERVICAL SPINE FINDINGS

Alignment: Normal

Skull base and vertebrae: No acute fracture. No primary bone lesion
or focal pathologic process.

Soft tissues and spinal canal: No prevertebral fluid or swelling. No
visible canal hematoma.

Disc levels:  Diffuse degenerative disc and facet disease.

Upper chest: No acute findings

Other: None
IMPRESSION: Atrophy, chronic microvascular disease.

No acute intracranial abnormality.

Cervical spondylosis.  No acute bony abnormality.

## 2019-02-11 MED ORDER — LIDOCAINE HCL (PF) 1 % IJ SOLN
5.0000 mL | Freq: Once | INTRAMUSCULAR | Status: AC
  Administered 2019-02-11: 5 mL via INTRADERMAL
  Filled 2019-02-11: qty 5

## 2019-02-11 NOTE — ED Provider Notes (Signed)
[facility] Emergency Department Provider Note  ____________________________________________   First MD Initiated Contact with Patient 02/11/19 0032     (approximate)  I have reviewed the triage vital signs and the nursing notes.   HISTORY  Chief Complaint No chief complaint on file.    HPI Logan Morgan is a 78 y.o. male with below listed previous medical conditions presents to the emergency department via EMS following accidental fall.  Patient admits to drinking "a few alcoholic beverages this evening".  Patient states that he lost his balance.  Patient has a reported 1 inch laceration to the occipital scalp.  Patient denies any loss of consciousness no headache no dizziness no nausea no vomiting.  Patient denies any weakness numbness or visual changes.        Past Medical History:  Diagnosis Date  . COPD (chronic obstructive pulmonary disease) ([place])   . Dysrhythmia     Patient Active Problem List   Diagnosis Date Noted  . Ventricular tachyarrhythmia ([place]) 07/28/2016  . A-fib ([place]) 10/24/2014  . AF (paroxysmal atrial fibrillation) ([place]) 08/13/2014  . History of tachycardia 08/06/2014  . 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 Essential (primary) hypertension 06/13/2014  . Cancer of prostate ([place]) 06/13/2014  . Mixed hyperlipidemia 06/13/2014    No past surgical history on file.  Prior to Admission medications   Medication Sig Start Date End Date Taking? Authorizing Provider  albuterol (PROVENTIL HFA;VENTOLIN HFA) 108 (90 Base) MCG/ACT inhaler Inhale 2 puffs into the lungs daily as needed for wheezing or shortness of breath.    [provider]  amiodarone (PACERONE) 200 MG tablet Take 2 tablets (400 mg total) by mouth 2 (two) times daily. 07/31/16   Epifanio Lesches, MD  folic acid (FOLVITE) [id number] MCG tablet Take 400 mcg by mouth daily.    [provider]  glucosamine-chondroitin 500-400 MG tablet Take  1 tablet by mouth 2 (two) times daily.     [provider]  losartan (COZAAR) 100 MG tablet Take 100 mg by mouth daily.    [provider]  metoprolol succinate (TOPROL-XL) 25 MG 24 hr tablet Take 25 mg by mouth daily.    [provider]  milk thistle 175 MG tablet Take 175 mg by mouth daily.    [provider]  Mometasone Furo-Formoterol Fum (DULERA IN) Inhale 2 puffs into the lungs daily as needed.    [provider]  mometasone-formoterol (DULERA) 100-5 MCG/ACT AERO Inhale into the lungs. 07/08/15 07/28/17  [provider]  Multiple Vitamin (ONE-A-DAY MENS PO) Take 1 tablet by mouth daily.    [provider]  potassium chloride SA (K-DUR,KLOR-CON) 20 MEQ tablet Take 1 tablet (20 mEq total) by mouth daily. 10/26/14   Teodoro Spray, MD  pravastatin (PRAVACHOL) 40 MG tablet Take 40 mg by mouth daily.    [provider]  rivaroxaban (XARELTO) 20 MG TABS tablet Take 20 mg by mouth daily with supper.    [provider]  sertraline (ZOLOFT) 50 MG tablet TAKE 1 TABLET ONCE DAILY 05/12/16   [provider]  tamsulosin (FLOMAX) 0.4 MG CAPS capsule Take 0.4 mg by mouth daily.    [provider]  tiotropium (SPIRIVA) 18 MCG inhalation capsule Place 18 mcg into inhaler and inhale daily.    [provider]  valACYclovir (VALTREX) 1000 MG tablet Take 1 tablet (1,000 mg total) by mouth 3 (three) times daily. 01/19/18   Delman Kitten, MD  Allergies Patient has no known allergies.  Family History  Problem Relation Age of Onset  . COPD Mother     Social History Social History   Tobacco Use  . Smoking status: Former Research scientist (life sciences)  . Smokeless tobacco: Never Used  Substance Use Topics  . Alcohol use: Yes    Comment: vodka daily  . Drug use: No    Review of Systems Constitutional: No fever/chills Eyes: No visual changes. ENT: No sore throat. Cardiovascular: Denies chest pain. Respiratory: Denies  shortness of breath. Gastrointestinal: No abdominal pain.  No nausea, no vomiting.  No diarrhea.  No constipation. Genitourinary: Negative for dysuria. Musculoskeletal: Negative for neck pain.  Negative for back pain. Integumentary: Negative for rash.  Positive for occipital laceration. Neurological: Negative for headaches, focal weakness or numbness.   ____________________________________________   PHYSICAL EXAM:  VITAL SIGNS: ED Triage Vitals  Enc Vitals Group     BP 02/11/19 0028 115/78     Pulse Rate 02/11/19 0028 86     Resp 02/11/19 0028 15     Temp 02/11/19 0028 98.1 F (36.7 C)     Temp Source 02/11/19 0028 Oral     SpO2 02/11/19 0028 95 %     Weight 02/11/19 0031 74.8 kg (165 lb)     Height 02/11/19 0031 1.753 m (5\' 9" )     Head Circumference --      Peak Flow --      Pain Score --      Pain Loc --      Pain Edu? --      Excl. in [place]? --     Constitutional: Alert and oriented.  Appears intoxicated Eyes: Conjunctivae are normal.  Head: 1 inch linear laceration midline occipital scalp.  Bleeding controlled Mouth/Throat: Mucous membranes are moist. Neck: No stridor.  No meningeal signs.   Cardiovascular: Normal rate, regular rhythm. Good peripheral circulation. Grossly normal heart sounds. Respiratory: Normal respiratory effort.  No retractions. Gastrointestinal: Soft and nontender. No distention.  Musculoskeletal: No lower extremity tenderness nor edema. No gross deformities of extremities. Neurologic:  Normal speech and language. No gross focal neurologic deficits are appreciated.  Skin:  Skin is warm, dry and intact. Psychiatric: Mood and affect are normal. Speech and behavior are normal.   RADIOLOGY I, Duncan, personally viewed and evaluated these images (plain radiographs) as part of my medical decision making, as well as reviewing the written report by the radiologist.  ED MD interpretation: CT head and cervical spine revealed no acute  intracranial abnormality or bony abnormality per radiologist.  Official radiology report(s): Ct Head Wo Contrast  Result Date: 02/11/2019 CLINICAL DATA:  Fall.  Laceration right lateral posterior scalp EXAM: CT HEAD WITHOUT CONTRAST CT CERVICAL SPINE WITHOUT CONTRAST TECHNIQUE: Multidetector CT imaging of the head and cervical spine was performed following the standard protocol without intravenous contrast. Multiplanar CT image reconstructions of the cervical spine were also generated. COMPARISON:  None. FINDINGS: CT HEAD FINDINGS Brain: There is atrophy and chronic small vessel disease changes. No acute intracranial abnormality. Specifically, no hemorrhage, hydrocephalus, mass lesion, acute infarction, or significant intracranial injury. Vascular: No hyperdense vessel or unexpected calcification. Skull: No acute calvarial abnormality. Sinuses/Orbits: Visualized paranasal sinuses and mastoids clear. Orbital soft tissues unremarkable. Other: None CT CERVICAL SPINE FINDINGS Alignment: Normal Skull base and vertebrae: No acute fracture. No primary bone lesion or focal pathologic process. Soft tissues and spinal canal: No prevertebral fluid or swelling. No visible canal hematoma. Disc levels:  Diffuse  degenerative disc and facet disease. Upper chest: No acute findings Other: None IMPRESSION: Atrophy, chronic microvascular disease. No acute intracranial abnormality. Cervical spondylosis.  No acute bony abnormality. Electronically Signed   By: Rolm Baptise M.D.   On: 02/11/2019 01:01   Ct Cervical Spine Wo Contrast  Result Date: 02/11/2019 CLINICAL DATA:  Fall.  Laceration right lateral posterior scalp EXAM: CT HEAD WITHOUT CONTRAST CT CERVICAL SPINE WITHOUT CONTRAST TECHNIQUE: Multidetector CT imaging of the head and cervical spine was performed following the standard protocol without intravenous contrast. Multiplanar CT image reconstructions of the cervical spine were also generated. COMPARISON:  None. FINDINGS:  CT HEAD FINDINGS Brain: There is atrophy and chronic small vessel disease changes. No acute intracranial abnormality. Specifically, no hemorrhage, hydrocephalus, mass lesion, acute infarction, or significant intracranial injury. Vascular: No hyperdense vessel or unexpected calcification. Skull: No acute calvarial abnormality. Sinuses/Orbits: Visualized paranasal sinuses and mastoids clear. Orbital soft tissues unremarkable. Other: None CT CERVICAL SPINE FINDINGS Alignment: Normal Skull base and vertebrae: No acute fracture. No primary bone lesion or focal pathologic process. Soft tissues and spinal canal: No prevertebral fluid or swelling. No visible canal hematoma. Disc levels:  Diffuse degenerative disc and facet disease. Upper chest: No acute findings Other: None IMPRESSION: Atrophy, chronic microvascular disease. No acute intracranial abnormality. Cervical spondylosis.  No acute bony abnormality. Electronically Signed   By: Rolm Baptise M.D.   On: 02/11/2019 01:01    ____________________________________________   PROCEDURES     .Marland KitchenLaceration Repair  Date/Time: 02/11/2019 1:39 AM Performed by: Gregor Hams, MD Authorized by: Gregor Hams, MD   Consent:    Consent obtained:  Verbal   Consent given by:  Patient   Risks discussed:  Infection, pain, retained foreign body, poor cosmetic result and poor wound healing Anesthesia (see MAR for exact dosages):    Anesthesia method:  Local infiltration   Local anesthetic:  Lidocaine 1% w/o epi Laceration details:    Location:  Scalp   Scalp location:  Occipital   Length (cm):  4 Repair type:    Repair type:  Simple Exploration:    Hemostasis achieved with:  Direct pressure   Wound exploration: entire depth of wound probed and visualized     Contaminated: no   Treatment:    Area cleansed with:  Saline   Amount of cleaning:  Extensive   Irrigation solution:  Sterile saline   Visualized foreign bodies/material removed: no   Skin  repair:    Repair method:  Staples   Number of staples:  3 Approximation:    Approximation:  Close Post-procedure details:    Dressing:  Sterile dressing   Patient tolerance of procedure:  Tolerated well, no immediate complications     ____________________________________________   INITIAL IMPRESSION / MDM / ASSESSMENT AND PLAN / ED COURSE  As part of my medical decision making, I reviewed the following data within the [place] NUMBER 78 year old male presenting with above-stated history and physical exam secondary to accidental fall resultant occipital scalp laceration.  Patient is currently taking Xarelto and as such CT scan of the head was performed which revealed no acute intracranial abnormality.  Likewise CT cervical spine was unremarkable.  Patient's wound was repaired without difficulty.  Patient will be discharged home in the custody of his wife      ____________________________________________  FINAL CLINICAL IMPRESSION(S) / ED DIAGNOSES  Final diagnoses:  Laceration of occipital scalp, initial encounter     MEDICATIONS GIVEN DURING THIS VISIT:  Medications  lidocaine (PF) (XYLOCAINE) 1 % injection 5 mL (has no administration in time range)     ED Discharge Orders    None      *Please note:  Sudais Widmeyer was evaluated in Emergency Department on 02/11/2019 for the symptoms described in the history of present illness. He was evaluated in the context of the global COVID-19 pandemic, which necessitated consideration that the patient might be at risk for infection with the SARS-CoV-2 virus that causes COVID-19. Institutional protocols and algorithms that pertain to the evaluation of patients at risk for COVID-19 are in a state of rapid change based on information released by regulatory bodies including the CDC and federal and state organizations. These policies and algorithms were followed during the patient's care in the ED.  Some ED evaluations and  interventions may be delayed as a result of limited staffing during the pandemic.*  Note:  This document was prepared using Dragon voice recognition software and may include unintentional dictation errors.   Gregor Hams, MD 02/11/19 [phone]

## 2019-02-11 NOTE — ED Triage Notes (Signed)
Patient states that he had a fall today related to drinking too much alcohol. He states that he did not lose consciousness.

## 2019-02-16 ENCOUNTER — Ambulatory Visit (INDEPENDENT_AMBULATORY_CARE_PROVIDER_SITE_OTHER): Payer: Medicare HMO | Admitting: Psychology

## 2019-02-16 DIAGNOSIS — F4310 Post-traumatic stress disorder, unspecified: Secondary | ICD-10-CM | POA: Diagnosis not present

## 2019-03-02 ENCOUNTER — Ambulatory Visit: Payer: Medicare HMO | Admitting: Psychology

## 2019-03-03 ENCOUNTER — Ambulatory Visit (INDEPENDENT_AMBULATORY_CARE_PROVIDER_SITE_OTHER): Payer: Medicare HMO | Admitting: Psychology

## 2019-03-03 DIAGNOSIS — F4310 Post-traumatic stress disorder, unspecified: Secondary | ICD-10-CM

## 2019-03-16 ENCOUNTER — Ambulatory Visit (INDEPENDENT_AMBULATORY_CARE_PROVIDER_SITE_OTHER): Payer: Medicare HMO | Admitting: Psychology

## 2019-03-16 DIAGNOSIS — F4310 Post-traumatic stress disorder, unspecified: Secondary | ICD-10-CM

## 2019-03-30 ENCOUNTER — Ambulatory Visit (INDEPENDENT_AMBULATORY_CARE_PROVIDER_SITE_OTHER): Payer: Medicare HMO | Admitting: Psychology

## 2019-03-30 DIAGNOSIS — F4310 Post-traumatic stress disorder, unspecified: Secondary | ICD-10-CM

## 2019-04-13 ENCOUNTER — Ambulatory Visit (INDEPENDENT_AMBULATORY_CARE_PROVIDER_SITE_OTHER): Payer: Medicare HMO | Admitting: Psychology

## 2019-04-13 DIAGNOSIS — F4310 Post-traumatic stress disorder, unspecified: Secondary | ICD-10-CM | POA: Diagnosis not present

## 2019-04-27 ENCOUNTER — Ambulatory Visit: Payer: Medicare HMO | Admitting: Psychology

## 2019-05-11 ENCOUNTER — Ambulatory Visit: Payer: Medicare HMO | Admitting: Psychology

## 2019-05-11 ENCOUNTER — Ambulatory Visit (INDEPENDENT_AMBULATORY_CARE_PROVIDER_SITE_OTHER): Payer: Medicare HMO | Admitting: Psychology

## 2019-05-11 DIAGNOSIS — F4310 Post-traumatic stress disorder, unspecified: Secondary | ICD-10-CM

## 2019-05-25 ENCOUNTER — Ambulatory Visit (INDEPENDENT_AMBULATORY_CARE_PROVIDER_SITE_OTHER): Payer: Medicare HMO | Admitting: Psychology

## 2019-05-25 DIAGNOSIS — F4310 Post-traumatic stress disorder, unspecified: Secondary | ICD-10-CM

## 2019-06-08 ENCOUNTER — Ambulatory Visit: Payer: Medicare HMO | Admitting: Psychology

## 2019-06-19 DIAGNOSIS — F3340 Major depressive disorder, recurrent, in remission, unspecified: Secondary | ICD-10-CM | POA: Insufficient documentation

## 2019-06-22 ENCOUNTER — Ambulatory Visit: Payer: Medicare HMO | Admitting: Psychology

## 2019-07-06 ENCOUNTER — Ambulatory Visit: Payer: Medicare HMO | Admitting: Psychology

## 2019-07-20 ENCOUNTER — Ambulatory Visit: Payer: Medicare HMO | Admitting: Psychology

## 2020-05-24 ENCOUNTER — Other Ambulatory Visit: Payer: Self-pay | Admitting: Internal Medicine

## 2020-05-24 DIAGNOSIS — C61 Malignant neoplasm of prostate: Secondary | ICD-10-CM

## 2020-05-24 DIAGNOSIS — R634 Abnormal weight loss: Secondary | ICD-10-CM

## 2020-05-24 DIAGNOSIS — R109 Unspecified abdominal pain: Secondary | ICD-10-CM

## 2020-05-24 DIAGNOSIS — R7989 Other specified abnormal findings of blood chemistry: Secondary | ICD-10-CM

## 2020-06-11 ENCOUNTER — Other Ambulatory Visit: Payer: Self-pay

## 2020-06-11 ENCOUNTER — Ambulatory Visit: Payer: Medicare HMO

## 2020-06-11 ENCOUNTER — Ambulatory Visit
Admission: RE | Admit: 2020-06-11 | Discharge: 2020-06-11 | Disposition: A | Discharge status: Home / Self Care | Payer: Medicare HMO | Source: Ambulatory Visit | Attending: Internal Medicine | Admitting: Internal Medicine

## 2020-06-11 DIAGNOSIS — R945 Abnormal results of liver function studies: Secondary | ICD-10-CM | POA: Diagnosis present

## 2020-06-11 DIAGNOSIS — R7989 Other specified abnormal findings of blood chemistry: Secondary | ICD-10-CM

## 2020-06-11 DIAGNOSIS — R634 Abnormal weight loss: Secondary | ICD-10-CM | POA: Diagnosis present

## 2020-06-11 DIAGNOSIS — R109 Unspecified abdominal pain: Secondary | ICD-10-CM | POA: Diagnosis present

## 2020-06-11 DIAGNOSIS — C61 Malignant neoplasm of prostate: Secondary | ICD-10-CM | POA: Diagnosis not present

## 2020-06-11 LAB — POCT I-STAT CREATININE: Creatinine, Ser: 1.1 mg/dL (ref 0.61–1.24)

## 2020-06-11 IMAGING — CT CT ABD-PELV W/ CM
2 of 5 series · 15 of 46 positions shown, 17 images · IV contrast (omnipaque)
Comparison: None.

CLINICAL DATA: Unexplained weight loss. Remote history of prostate
cancer.

EXAM:
CT ABDOMEN AND PELVIS WITH CONTRAST
TECHNIQUE: Multidetector CT imaging of the abdomen and pelvis was performed
using the standard protocol following bolus administration of
intravenous contrast.
CONTRAST:  100mL OMNIPAQUE IOHEXOL 300 MG/ML  SOLN

[Series 2: abd pelvis 5.00 · axial · 0.67mm/px · z∈[-1566,-1176]mm · 12 of 88 slices shown, 14 images]
[im 5/88  soft-tissue]
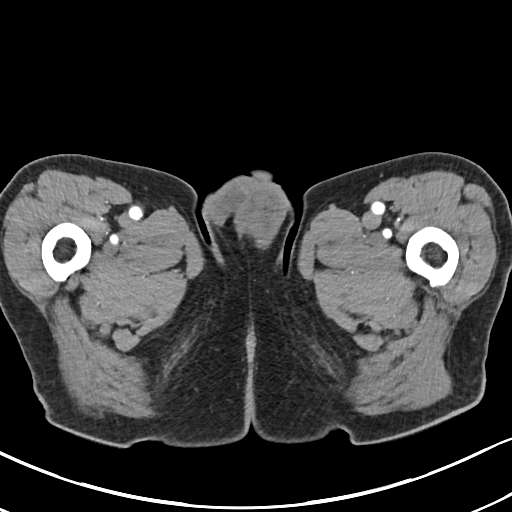
[im 5/88  bone]
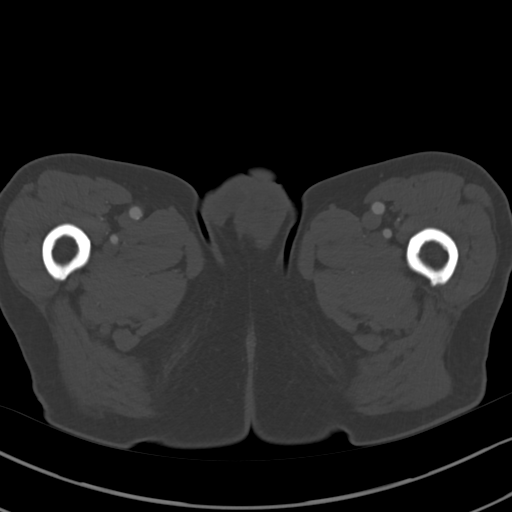
[im 14/88  soft-tissue]
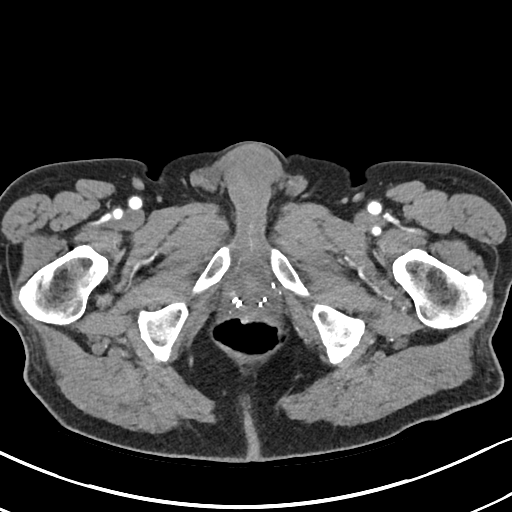
[im 19/88  soft-tissue]
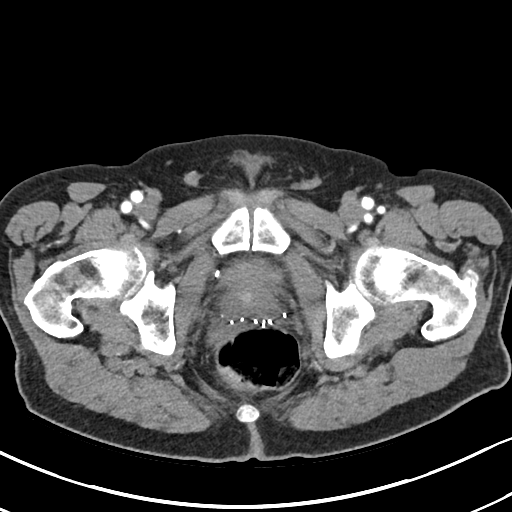
[im 28/88  soft-tissue]
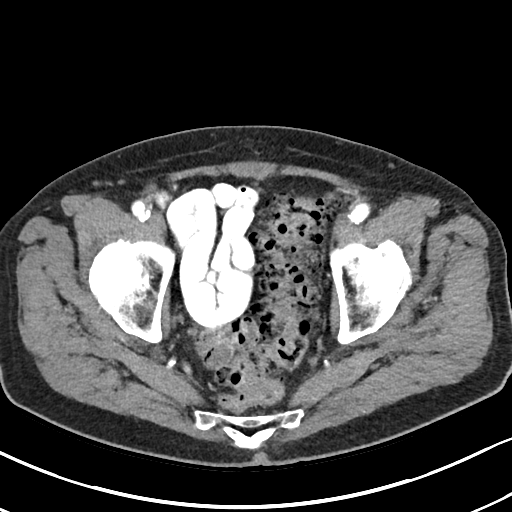
[im 33/88  soft-tissue]
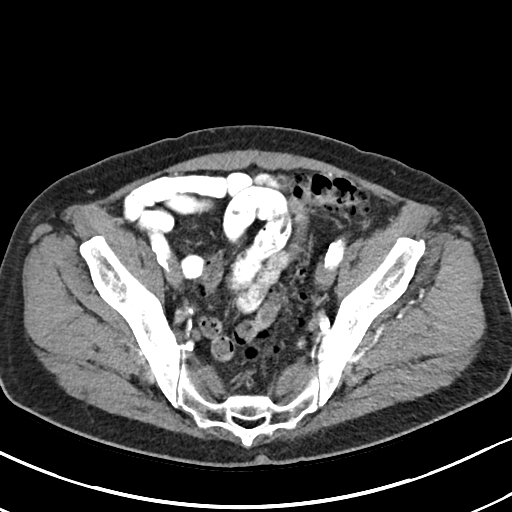
[im 42/88  soft-tissue]
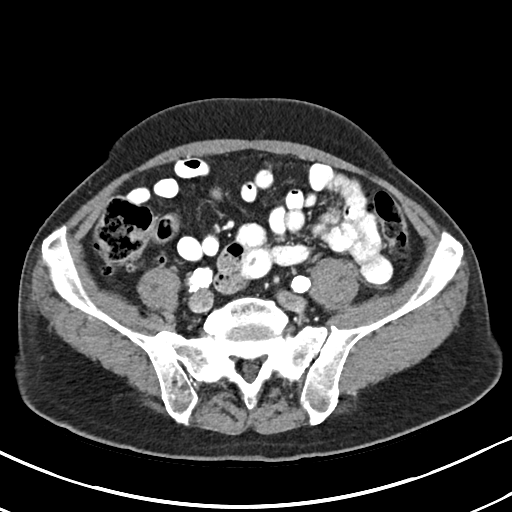
[im 46/88  soft-tissue]
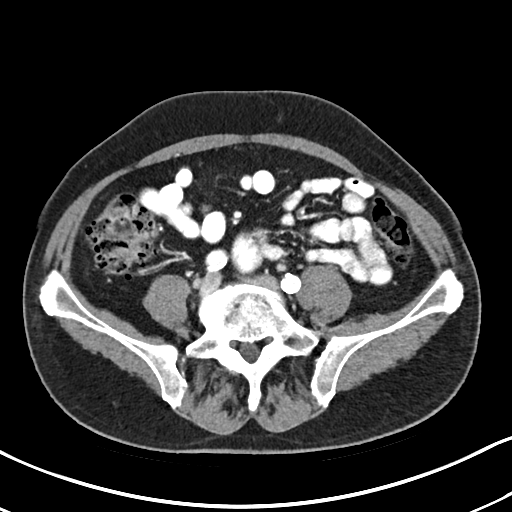
[im 55/88  soft-tissue]
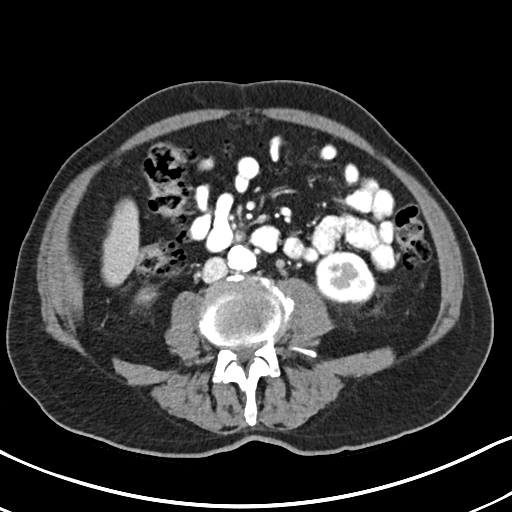
[im 60/88  soft-tissue]
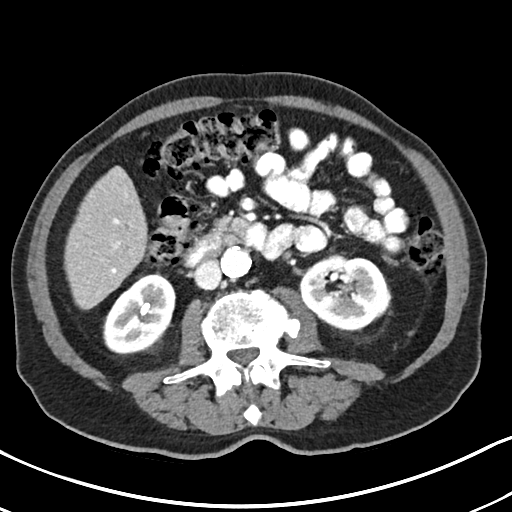
[im 60/88  bone]
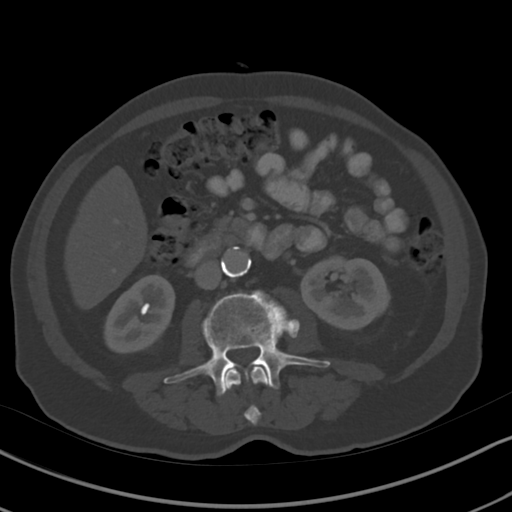
[im 69/88  soft-tissue]
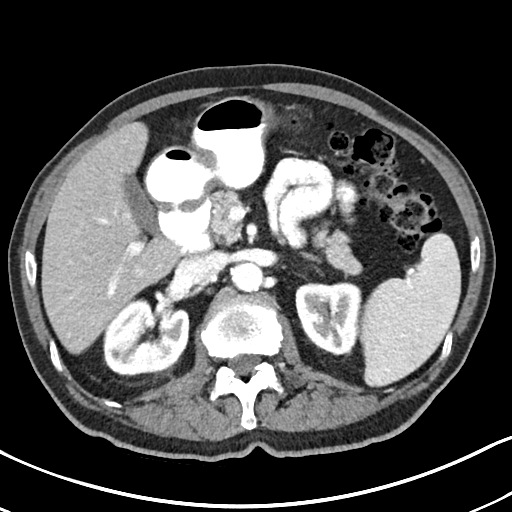
[im 74/88  soft-tissue]
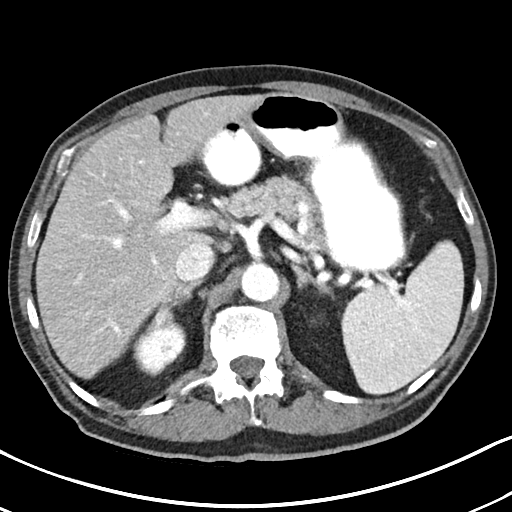
[im 83/88  soft-tissue]
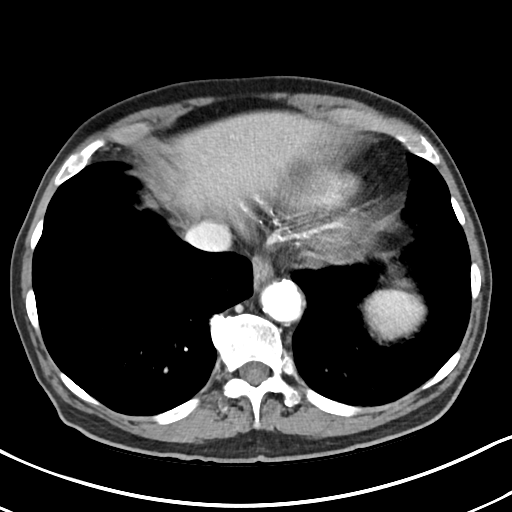

[Series 4: coronals abd pelvis 2.00 cor · coronal · 0.67mm/px · 3 of 139 slices shown]
[im 47/139  soft-tissue]
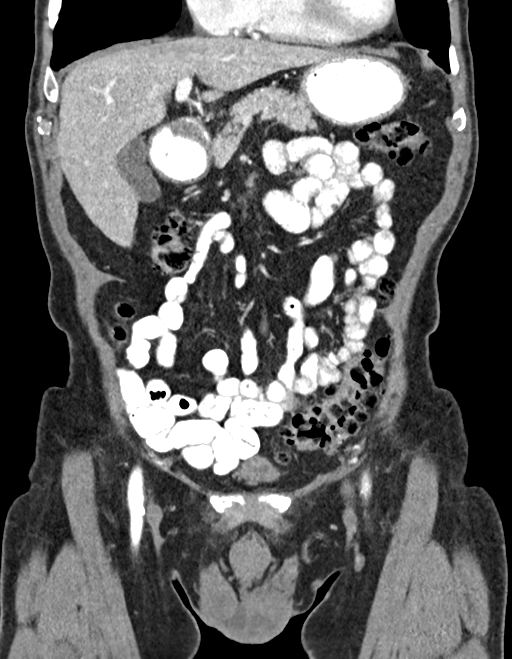
[im 62/139  soft-tissue]
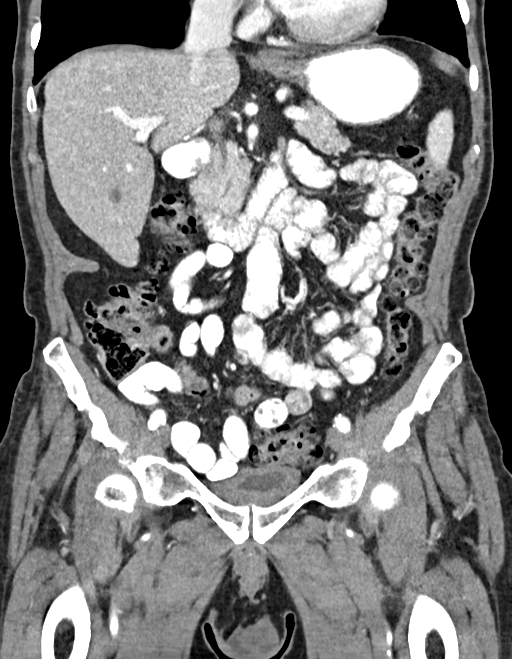
[im 77/139  soft-tissue]
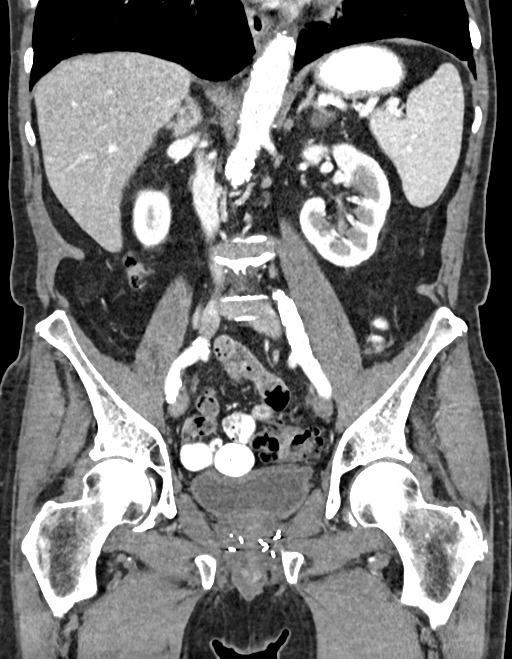

[15 of 46 positions shown; findings below may reference images not displayed]

FINDINGS: Lower chest: Unremarkable

Hepatobiliary: Several hypoattenuating lesions are seen in the
liver, most of which are too small to characterize. 15 mm lesion
inferior right liver on [DATE] cannot be definitively characterized
but is likely benign. There is no evidence for gallstones,
gallbladder wall thickening, or pericholecystic fluid. No
intrahepatic or extrahepatic biliary dilation.

Pancreas: No focal mass lesion. No dilatation of the main duct. No
intraparenchymal cyst. No peripancreatic edema.

Spleen: No splenomegaly. No focal mass lesion.

Adrenals/Urinary Tract: No adrenal nodule or mass. 1.4 x 1.5 x
cm heterogeneously enhancing lesion is identified in the upper pole
right kidney (axial [DATE] and well demonstrated coronal 88/4). Tiny
hypoattenuating lesion interpolar right kidney is likely a cyst. 9 x
6 x 7 mm nonobstructing stone is identified lower pole right kidney.
Tiny exophytic cyst noted upper pole left kidney. No evidence for
hydroureter. The urinary bladder shows some irregular wall
thickening anteriorly (66/2).

Stomach/Bowel: Stomach is unremarkable. No gastric wall thickening.
No evidence of outlet obstruction. Duodenum is normally positioned
as is the ligament of Treitz. No small bowel wall thickening. No
small bowel dilatation. The appendix is normal. No gross colonic
mass. No colonic wall thickening. Diverticuli are seen scattered
along the entire length of the colon without CT findings of
diverticulitis.

Vascular/Lymphatic: There is abdominal aortic atherosclerosis
without aneurysm. There is no gastrohepatic or hepatoduodenal
ligament lymphadenopathy. No retroperitoneal or mesenteric
lymphadenopathy. No pelvic sidewall lymphadenopathy.

Reproductive: Brachytherapy seeds noted in the prostate gland.

Other: No intraperitoneal free fluid.

Musculoskeletal: No worrisome lytic or sclerotic osseous
abnormality. Small sclerotic focus in the L5 vertebral body is
likely a bone island. No worrisome lytic or sclerotic osseous
abnormality.
IMPRESSION: 1. 1.4 x 1.5 x 1.5 cm heterogeneously enhancing lesion in the upper
pole right kidney, highly suspicious for renal cell carcinoma. MRI
of the abdomen without and with contrast recommended to further
evaluate.
2. Several hypoattenuating lesions in the liver, most of which are
too small to characterize. 15 mm lesion inferior right liver cannot
be definitively characterized but is likely benign. These could also
be further assessed at the time of follow-up MRI.
[DATE] x 6 x 7 mm nonobstructing stone lower pole right kidney.
4. Diffuse colonic diverticulosis without diverticulitis.
5. Aortic Atherosclerosis ([Q9]-[Q9]).

These results will be called to the ordering clinician or
representative by the Radiologist Assistant, and communication
documented in the PACS or [REDACTED].

## 2020-06-11 MED ORDER — IOHEXOL 300 MG/ML  SOLN
100.0000 mL | Freq: Once | INTRAMUSCULAR | Status: AC | PRN
  Administered 2020-06-11: 100 mL via INTRAVENOUS

## 2020-06-12 ENCOUNTER — Other Ambulatory Visit: Payer: Self-pay | Admitting: Internal Medicine

## 2020-06-12 ENCOUNTER — Other Ambulatory Visit (HOSPITAL_COMMUNITY): Payer: Self-pay | Admitting: Internal Medicine

## 2020-06-12 DIAGNOSIS — C649 Malignant neoplasm of unspecified kidney, except renal pelvis: Secondary | ICD-10-CM

## 2020-06-18 ENCOUNTER — Other Ambulatory Visit: Payer: Self-pay

## 2020-06-18 ENCOUNTER — Ambulatory Visit
Admission: RE | Admit: 2020-06-18 | Discharge: 2020-06-18 | Disposition: A | Discharge status: Home / Self Care | Payer: Medicare HMO | Source: Ambulatory Visit | Attending: Internal Medicine | Admitting: Internal Medicine

## 2020-06-18 DIAGNOSIS — C649 Malignant neoplasm of unspecified kidney, except renal pelvis: Secondary | ICD-10-CM

## 2020-06-18 IMAGING — MR MR ABDOMEN W/O CM
9 series · 39 of 48 positions shown · non-contrast
Comparison: [DATE] CT.

CLINICAL DATA: Upper pole right renal lesion on CT. Asymptomatic.
Emphysema.

EXAM:
MRI ABDOMEN WITHOUT CONTRAST
TECHNIQUE: Multiplanar multisequence MR imaging was performed without the
administration of intravenous contrast.

[Series 2: T2 · coronal · 6.5mm · 1.19mm/px · 3 of 30 slices shown (1 of 2)]
[im 1/30]
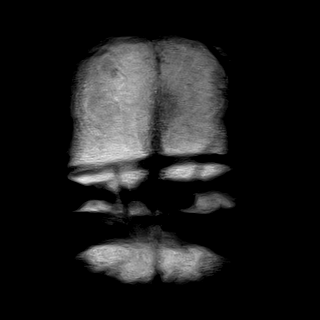
[im 15/30]
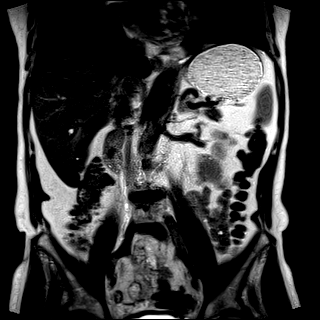
[im 30/30]
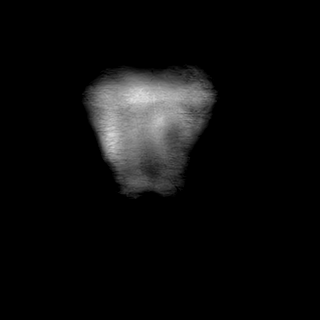

[Series 3: T2 · axial · 6.0mm · 1.19mm/px · z∈[-141,+83]mm · 4 of 32 slices shown (2 of 2)]
[im 1/32]
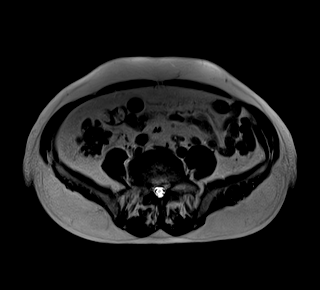
[im 11/32]
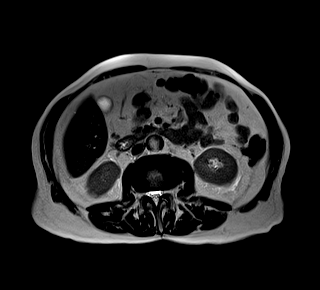
[im 21/32]
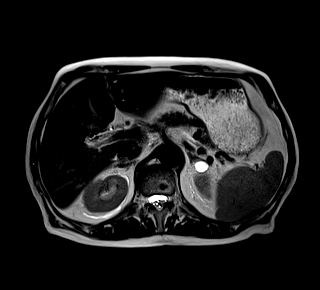
[im 32/32]
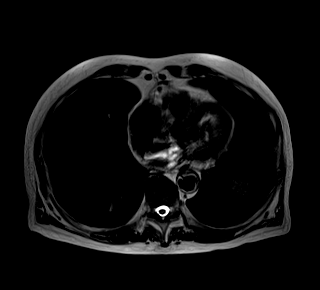

[Series 5: T2 fat-sat · axial · 6.0mm · 1.19mm/px · z∈[-125,+113]mm · 4 of 34 slices shown]
[im 1/34]
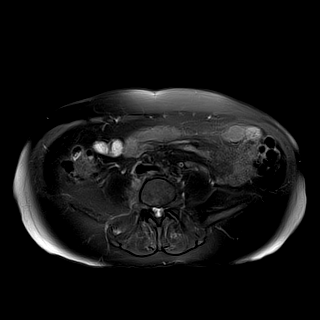
[im 12/34]
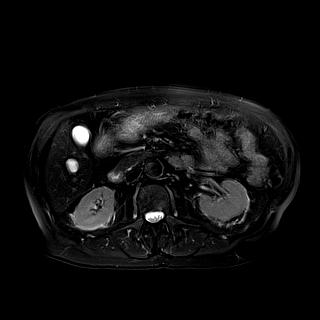
[im 23/34]
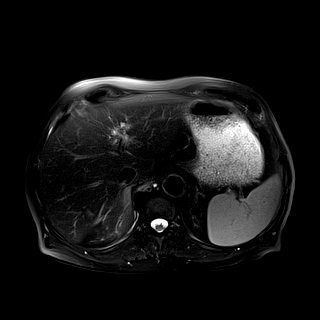
[im 34/34]
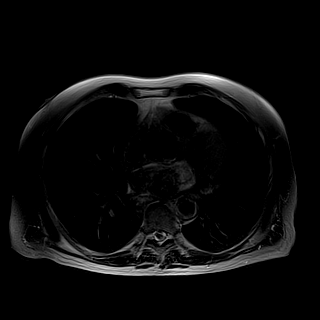

[Series 6: ax dwi_tracew · axial · 6.0mm · 1.42mm/px · z∈[-125,+113]mm · 9 of 102 slices shown]
[im 1/102]
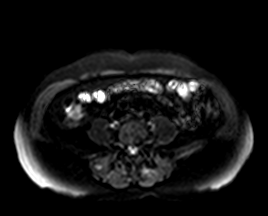
[im 19/102]
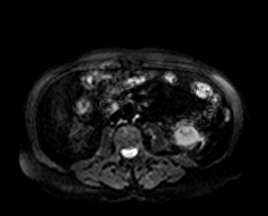
[im 28/102]
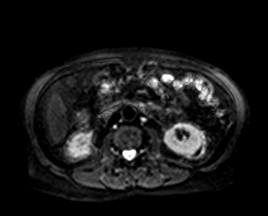
[im 46/102]
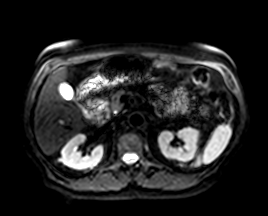
[im 56/102]
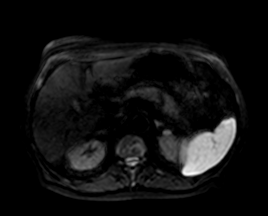
[im 74/102]
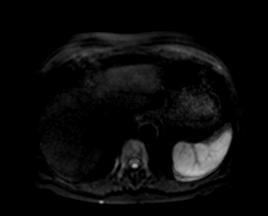
[im 83/102]
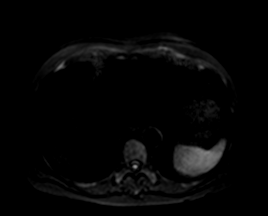
[im 92/102]
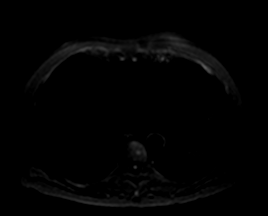
[im 102/102]
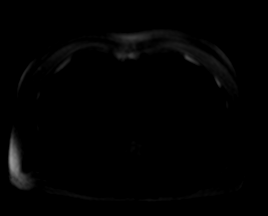

[Series 7: ax dwi_adc · axial · 6.0mm · 1.42mm/px · z∈[-125,+113]mm · 4 of 34 slices shown]
[im 1/34]
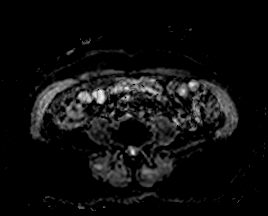
[im 12/34]
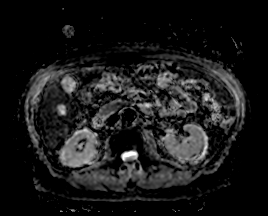
[im 23/34]
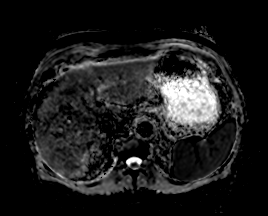
[im 34/34]
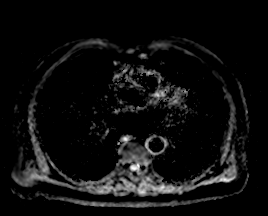

[Series 8: T1 · axial · 6.0mm · 0.74mm/px · z∈[-141,+83]mm · 4 of 32 slices shown (1 of 2)]
[im 1/32]
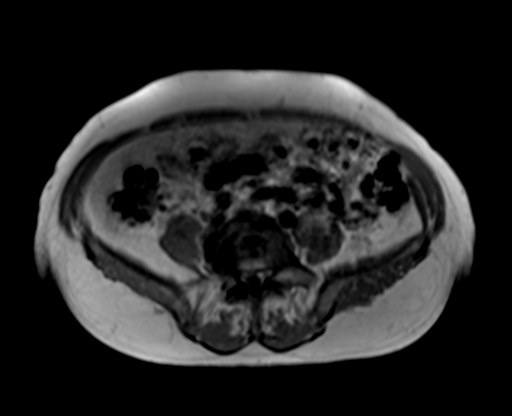
[im 11/32]
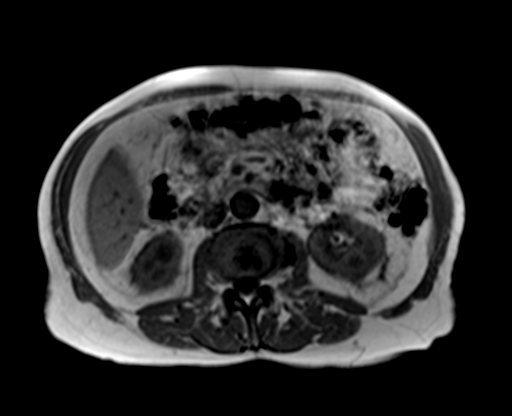
[im 21/32]
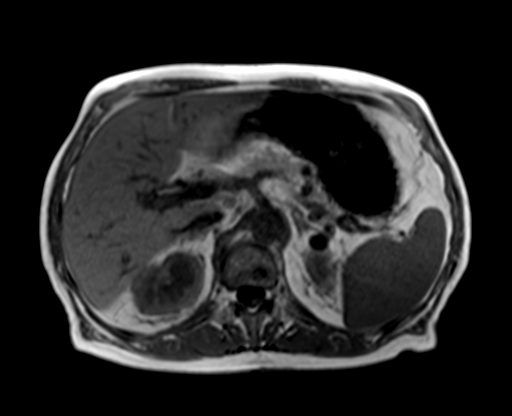
[im 32/32]
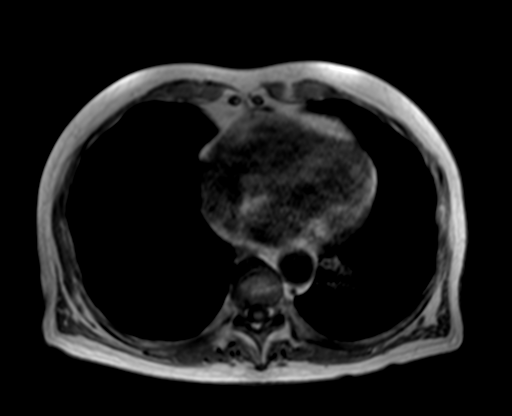

[Series 8: T1 · axial · 6.0mm · 0.74mm/px · z∈[-141,+83]mm · 4 of 32 slices shown (2 of 2)]
[im 1/32]
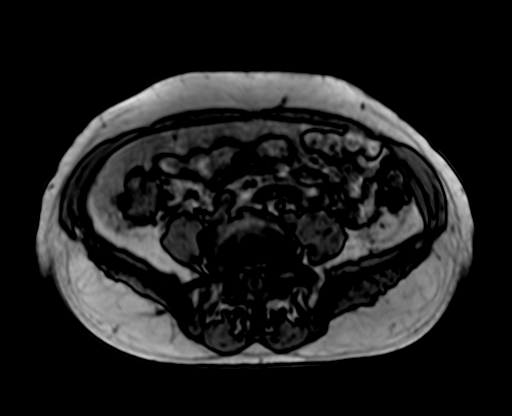
[im 11/32]
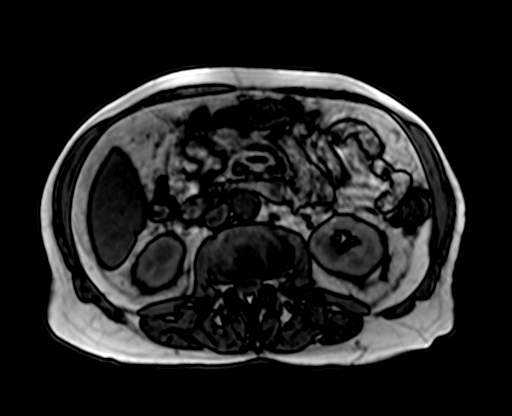
[im 21/32]
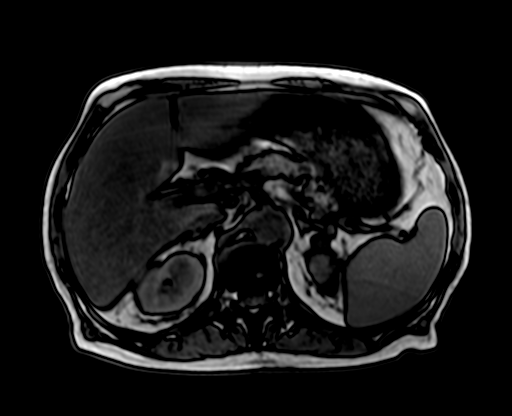
[im 32/32]
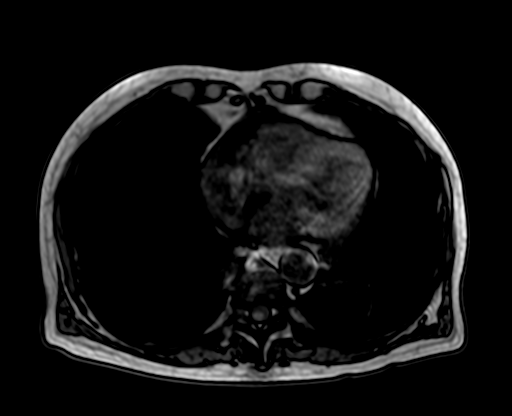

[Series 9: bSSFP · axial · 6.0mm · 0.74mm/px · z∈[-141,+83]mm · 4 of 32 slices shown]
[im 1/32]
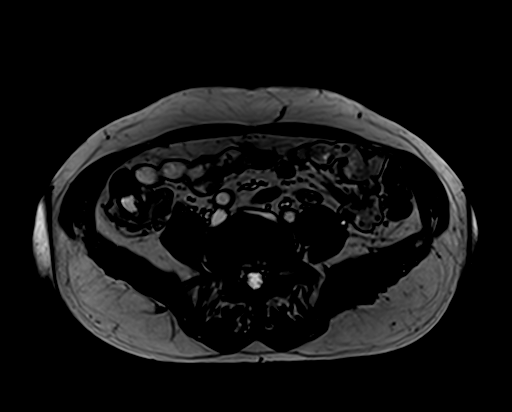
[im 11/32]
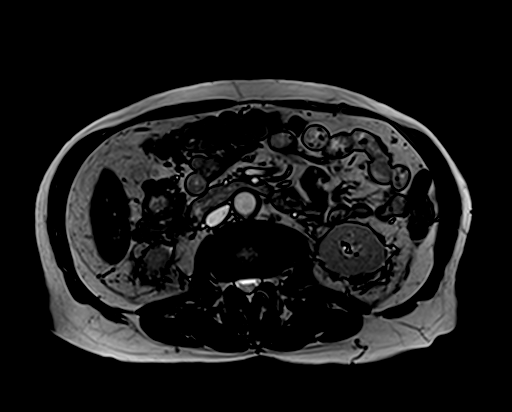
[im 21/32]
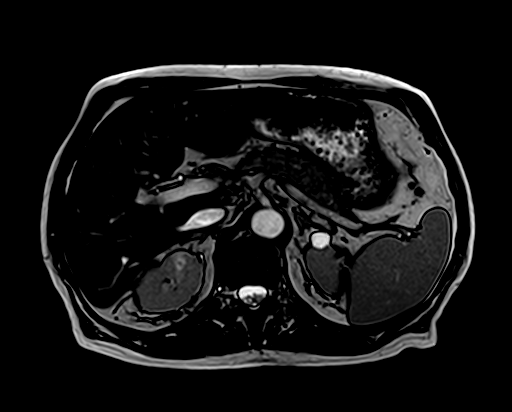
[im 32/32]
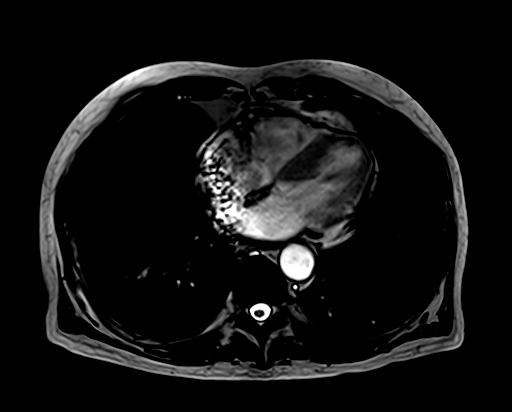

[Series 10: T1 dynamic fat-sat · axial · non-contrast · 3.0mm · 1.19mm/px · z∈[-147,-90]mm · 3 of 80 slices shown]
[im 1/80]
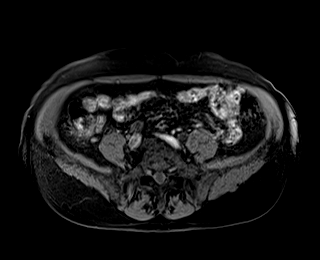
[im 10/80]
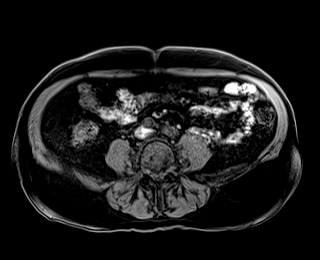
[im 20/80]
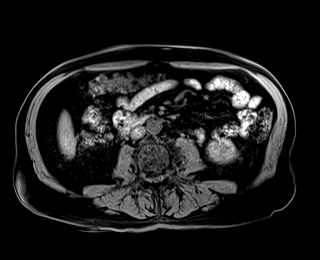

[39 of 48 positions shown; findings below may reference images not displayed]

FINDINGS: Degraded exam secondary to lack of IV contrast.

Lower chest: Mild cardiomegaly, without pericardial or pleural
effusion.

Hepatobiliary: No evidence of cirrhosis. T2 hyperintense lesions
throughout the liver. The largest lesion measures 1.4 cm in the
inferior right hepatic lobe on [DATE]. Markedly T2 hyperintense.
Incompletely characterized without contrast. Normal gallbladder,
without biliary ductal dilatation.

Pancreas:  Normal, without mass or ductal dilatation.

Spleen:  Normal in size, without focal abnormality.

Adrenals/Urinary Tract: Normal adrenal glands. Corresponding the CT
abnormality, within the anterior upper pole right kidney is a T2
hyperintense 1.2 cm lesion on [DATE]. Demonstrates mild complexity on
[DATE].

Also incompletely characterized without contrast.

Bilateral smaller T2 hyperintense renal lesions which are most
likely cysts.

An interpolar right renal T1 hyperintense 5 mm lesion on 41/10.

No hydronephrosis.

Stomach/Bowel: Normal stomach and small bowel. Extensive colonic
diverticulosis.

Vascular/Lymphatic: Aortic atherosclerosis. No abdominal adenopathy.

Other:  No ascites.

Musculoskeletal: Mild convex right lumbar spine curvature.
IMPRESSION: 1. The upper pole right renal lesion is incompletely characterized
secondary to lack of IV contrast. Overall, it is less suspicious
appearing on the current exam than on the CT of [DATE]. If the
patient can undergo pre and post contrast abdominal MRI, this is
recommended. If not, dedicated pre and post-contrast renal protocol
CT should be considered.
2. The right hepatic lobe lesion is also incompletely characterized
but favored to represent a cyst or complex cyst, based on
noncontrast MRI appearance.
3. Interpolar right renal 5 mm T1 hyperintense lesion is
incompletely characterized but most likely a
hemorrhagic/proteinaceous cyst.
4.  No acute abdominal process.
5.  Aortic Atherosclerosis ([95]-[95]).

## 2020-06-25 ENCOUNTER — Encounter: Payer: Self-pay | Admitting: Urology

## 2020-06-25 ENCOUNTER — Other Ambulatory Visit: Payer: Self-pay

## 2020-06-25 ENCOUNTER — Ambulatory Visit (INDEPENDENT_AMBULATORY_CARE_PROVIDER_SITE_OTHER): Payer: Medicare HMO | Admitting: Urology

## 2020-06-25 VITALS — BP 107/67 | HR 81 | Ht 68.0 in | Wt 150.0 lb

## 2020-06-25 DIAGNOSIS — Z8546 Personal history of malignant neoplasm of prostate: Secondary | ICD-10-CM

## 2020-06-25 DIAGNOSIS — Z85528 Personal history of other malignant neoplasm of kidney: Secondary | ICD-10-CM

## 2020-06-25 DIAGNOSIS — N2889 Other specified disorders of kidney and ureter: Secondary | ICD-10-CM | POA: Diagnosis not present

## 2020-06-25 DIAGNOSIS — N200 Calculus of kidney: Secondary | ICD-10-CM

## 2020-06-25 NOTE — Progress Notes (Signed)
06/25/2020 4:32 PM   [place] 03-02-1941 [id number]  Referring provider: Tracie Harrier, MD [address] [facility] [place],  [place] [address]  Chief Complaint  Patient presents with  . Renal mass    HPI: 80 year old male who presents today for further evaluation of incidental renal mass.  He was initially seen and evaluated by his primary care physician, Dr. Ginette Pitman, in December 2021 with elevated LFTs as well as some unexplained weight loss.  As part of this evaluation, he underwent CT abdomen pelvis with contrast on 06/11/2020 which revealed a 1.4 x 1.5 x 1.5 cm heterogeneous enhancing lesion in the right upper pole.  There is also additional less worrisome renal cyst along with an incidental 9 mm right lower pole calculus.  There is no adenopathy or any other concerning findings.  This was followed up with a noncontrast CT abdomen on 06/18/2020.  There is some confusion about this particular study.  Initially the patient reports that the study with contrast was declined by his insurance company, his primary care physician was out of town and there is some back-and-forth.  He ultimately underwent the study without contrast which was nondiagnostic.  Per the radiologist report, the lesion was "less suspicious" than on CT scan.  There is also an incidental liver lesion also incompletely characterized.  He denies any flank pain, dysuria, or any other GU related symptoms.  No personal history of kidney stones.  He is accompanied today by his wife.  He is relatively anxious.  He is a retired Forensic psychologist.  He reports that he has a lot of life stressors including the loss of one of his sons to homicide and now his other son is stressed with the role of being primary caretaker for his deceased son's child.  He also has a remote history of prostate cancer.  His PSA remains undetectable as of 12/12/2019.  He underwent brachytherapy for this, seeds noted on CT scan.  PMH: Past  Medical History:  Diagnosis Date  . COPD (chronic obstructive pulmonary disease) ([place])   . Dysrhythmia     Surgical History: History reviewed. No pertinent surgical history.  Home Medications:  Allergies as of 06/25/2020   No Known Allergies     Medication List       Accurate as of June 25, 2020 11:59 PM. If you have any questions, ask your nurse or doctor.        STOP taking these medications   albuterol 108 (90 Base) MCG/ACT inhaler Commonly known as: VENTOLIN HFA Stopped by: [name], MD   losartan 100 MG tablet Commonly known as: COZAAR Stopped by: [name], MD   tiotropium 18 MCG inhalation capsule Commonly known as: SPIRIVA Stopped by: [name], MD     TAKE these medications   amiodarone 200 MG tablet Commonly known as: Pacerone Take 2 tablets (400 mg total) by mouth 2 (two) times daily.   folic acid 809 MCG tablet Commonly known as: FOLVITE Take 400 mcg by mouth daily.   glucosamine-chondroitin 500-400 MG tablet Take 1 tablet by mouth 2 (two) times daily.   metoprolol succinate 25 MG 24 hr tablet Commonly known as: TOPROL-XL Take 25 mg by mouth daily.   milk thistle 175 MG tablet Take 175 mg by mouth daily.   DULERA IN Inhale 2 puffs into the lungs daily as needed.   mometasone-formoterol 100-5 MCG/ACT Aero Commonly known as: DULERA Inhale into the lungs.   olmesartan 40 MG tablet Commonly  known as: BENICAR Take by mouth.   ONE-A-DAY MENS PO Take 1 tablet by mouth daily.   potassium chloride SA 20 MEQ tablet Commonly known as: KLOR-CON Take 1 tablet (20 mEq total) by mouth daily.   pravastatin 40 MG tablet Commonly known as: PRAVACHOL Take 40 mg by mouth daily.   rivaroxaban 20 MG Tabs tablet Commonly known as: XARELTO Take 20 mg by mouth daily with supper.   sertraline 100 MG tablet Commonly known as: ZOLOFT Take 150 mg by mouth daily. What changed: Another medication with the same name was removed.  Continue taking this medication, and follow the directions you see here. Changed by: Vanna Scotland, MD   tamsulosin 0.4 MG Caps capsule Commonly known as: FLOMAX Take 0.4 mg by mouth daily.   Trelegy Ellipta 100-62.5-25 MCG/INH Aepb Generic drug: Fluticasone-Umeclidin-Vilant Inhale 1 puff into the lungs daily.   valACYclovir 1000 MG tablet Commonly known as: Valtrex Take 1 tablet (1,000 mg total) by mouth 3 (three) times daily.       Allergies: No Known Allergies  Family History: Family History  Problem Relation Age of Onset  . COPD Mother     Social History:  reports that he has quit smoking. He has never used smokeless tobacco. He reports current alcohol use. He reports that he does not use drugs.   Physical Exam: BP 107/67   Pulse 81   Ht 5\' 8"  (1.727 m)   Wt 150 lb (68 kg)   BMI 22.81 kg/m   Constitutional:  Alert and oriented, No acute distress.  Accompanied by wife today. HEENT: [place] AT, moist mucus membranes.  Trachea midline, no masses. Cardiovascular: No clubbing, cyanosis, or edema. Respiratory: Normal respiratory effort, no increased work of breathing. Skin: No rashes, bruises or suspicious lesions. Neurologic: Grossly intact, no focal deficits, moving all 4 extremities. Psychiatric: Normal mood and affect.  Laboratory Data: Lab Results  Component Value Date   WBC 5.3 01/19/2018   HGB 16.0 01/19/2018   HCT 46.1 01/19/2018   MCV 94.5 01/19/2018   PLT 185 01/19/2018    Lab Results  Component Value Date   CREATININE 1.10 06/11/2020    Pertinent Imaging: CT abdomen pelvis with contrast as well as MR abdomen without contrast were both personally reviewed.  Agree with radiologic interpretation.  Both of these studies are mildly suspicious but nondiagnostic.  Assessment & Plan:    1. Renal mass Incidental small renal mass, approximately 1.5 cm of the right upper pole  Unfortunately, follow-up study was diagnostic given the lack of contrast.   Appears of some confusion he is little bit frustrated about this.  A solid renal mass raises the suspicion of primary renal malignancy.  We discussed this in detail and in regards to the spectrum of renal masses which includes cysts (pure cysts are considered benign), solid masses and everything in between. The risk of metastasis increases as the size of solid renal mass increases. In general, it is believed that the risk of metastasis for renal masses less than 3-4 cm is small (up to approximately 5%) based mainly on large retrospective studies. In some cases and especially in patients of older age and multiple comorbidities a surveillance approach may be appropriate. The treatment of solid renal masses includes: surveillance, cryoablation (percutaneous and laparoscopic) in addition to partial and complete nephrectomy (each with option of laparoscopic, robotic and open depending on appropriateness). Furthermore, nephrectomy appears to be an independent risk factor for the development of chronic kidney disease suggesting  that nephron sparing approaches should be implored whenever feasible. We reviewed these options in context of the patients current situation as well as the pros and cons of each. For cystic renal masses, we reviewed the Bosniak classification and discussed that Bosniak 3 lesions harbor a 50% chance of malignancy whereas Bosniak 4 cysts have a solid and 90-95% are malignant in nature.   At this point, the lesion needs further characterization.  I discussed that it be most helpful if we wait a period of time, perhaps 4 months given that the lesion is relatively small for further characterization as well as to assess for interval growth which may also help guide management.  If lesion does grow significantly, this may be amenable to percutaneous cryotherapy versus partial for ectomy.  If there is minimal growth in the lesion appears indolent, may opt for surveillance.  Will also be able to  further characterize lesions in the liver with this follow-up study.  Ultimately, the patient is wife are comfortable with this plan.  I offered immediate follow-up study which they declined, opted for allowing some time for additional information about growth rate to help guide management.  - MR Abdomen W Wo Contrast; Future  2. History of prostate cancer No evidence of disease, PSA remains undetectable  3. Nephrolithiasis Incidental fairly large 9 mm right lower pole stone  Is unclear how long the stone has been present and is asymptomatic.  We discussed options including shockwave versus ureteroscopy versus surveillance.  This time, we will hold off on treatment and continue to follow the stone, wrapped with interval KUB to assess for growth.  He is agreeable this plan.   Follow-up in 4 months with abdominal MRI with and without contrast   [name], MD  [facility] [address], [place],  [address] [phone]  I spent 60 total minutes on the day of the encounter including pre-visit review of the medical record, face-to-face time with the patient, and post visit ordering of labs/imaging/tests.

## 2020-06-29 ENCOUNTER — Encounter: Payer: Self-pay | Admitting: Urology

## 2020-10-01 ENCOUNTER — Other Ambulatory Visit: Payer: Self-pay | Admitting: Cardiology

## 2020-10-01 DIAGNOSIS — R0602 Shortness of breath: Secondary | ICD-10-CM

## 2020-10-02 ENCOUNTER — Ambulatory Visit: Admission: RE | Admit: 2020-10-02 | Payer: Medicare HMO | Source: Ambulatory Visit

## 2020-10-02 ENCOUNTER — Other Ambulatory Visit: Payer: Medicare HMO

## 2020-10-11 ENCOUNTER — Other Ambulatory Visit: Payer: Self-pay

## 2020-10-11 ENCOUNTER — Ambulatory Visit
Admission: RE | Admit: 2020-10-11 | Discharge: 2020-10-11 | Disposition: A | Discharge status: Home / Self Care | Payer: Medicare HMO | Source: Ambulatory Visit | Attending: Urology | Admitting: Urology

## 2020-10-11 DIAGNOSIS — N2889 Other specified disorders of kidney and ureter: Secondary | ICD-10-CM | POA: Diagnosis present

## 2020-10-11 IMAGING — MR MR ABDOMEN WO/W CM
16 of 17 series · 43 of 48 positions shown · IV contrast (6 mL Gadavist)
Comparison: [DATE]

CLINICAL DATA: Follow-up indeterminate right renal lesion.

EXAM:
MRI ABDOMEN WITHOUT AND WITH CONTRAST
TECHNIQUE: Multiplanar multisequence MR imaging of the abdomen was performed
both before and after the administration of intravenous contrast.
CONTRAST:  6mL GADAVIST GADOBUTROL 1 MMOL/ML IV SOLN

[Series 2: cor ssfse / · coronal · 7.0mm · 1.48mm/px · 3 of 33 slices shown]
[im 1/33]
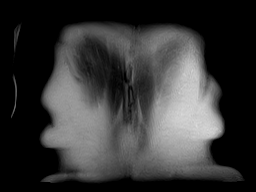
[im 17/33]
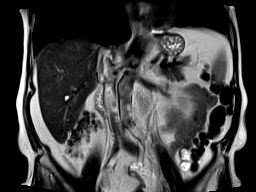
[im 33/33]
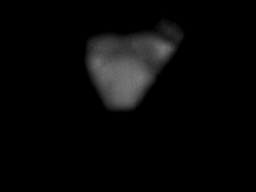

[Series 3: T2 fat-sat · axial · 6.0mm · 1.48mm/px · 1 of 30 slices shown]
[im 1/30]
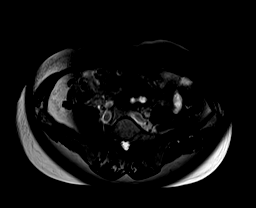

[Series 4: T1 · axial · 6.0mm · 0.74mm/px · z∈[-185,+24]mm · 3 of 60 slices shown]
[im 1/60]
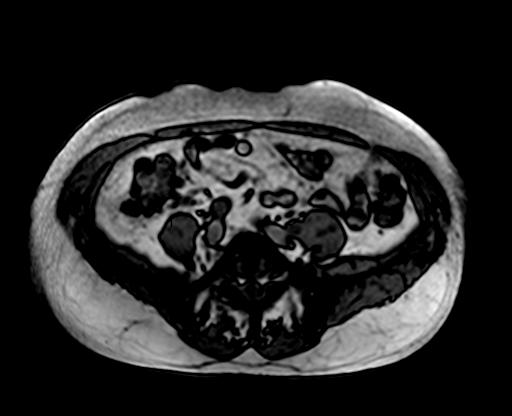
[im 30/60]
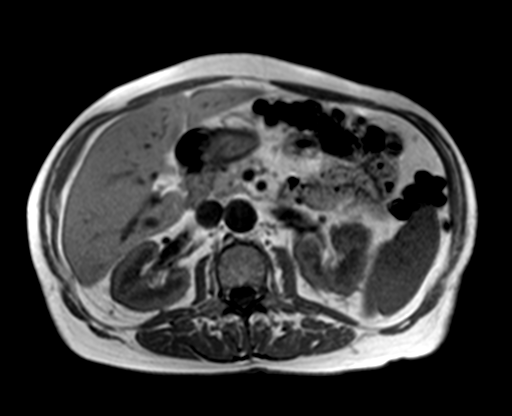
[im 60/60]
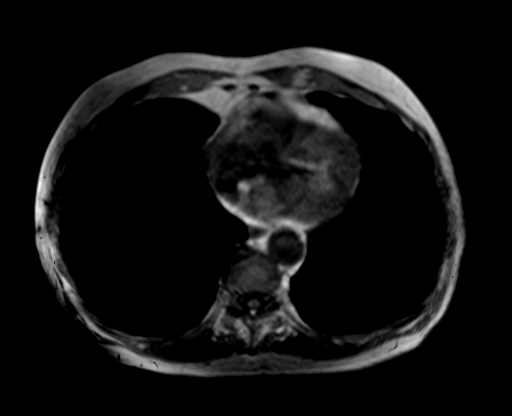

[Series 6: DWI · axial · 6.0mm · 2.00mm/px · z∈[-177,+54]mm · 5 of 99 slices shown (1 of 2)]
[im 1/99]
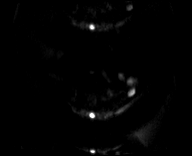
[im 25/99]
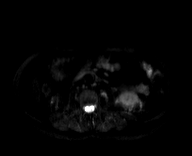
[im 50/99]
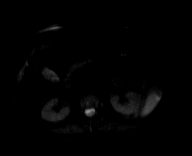
[im 74/99]
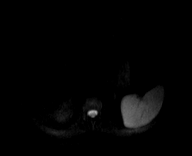
[im 99/99]
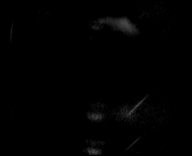

[Series 7: DWI · axial · 6.0mm · 2.00mm/px · z∈[-177,+54]mm · 2 of 33 slices shown (2 of 2)]
[im 1/33]
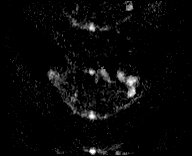
[im 33/33]
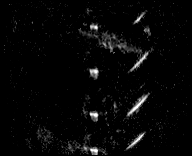

[Series 8: bSSFP · axial · 6.0mm · 0.74mm/px · 1 of 30 slices shown]
[im 1/30]
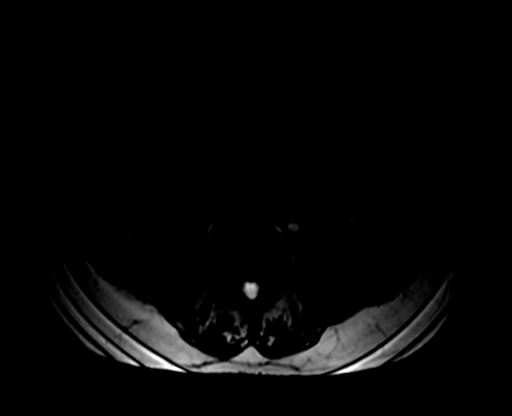

[Series 9: axial ssfse / · axial · 6.0mm · 1.19mm/px · 1 of 30 slices shown]
[im 1/30]
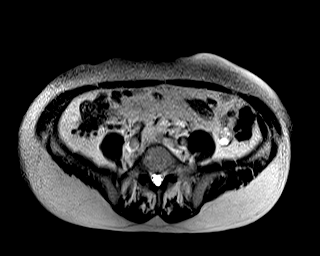

[Series 10: axial dynamic pre · axial · non-contrast · 4.0mm · 1.25mm/px · z∈[-206,+46]mm · 3 of 64 slices shown]
[im 1/64]
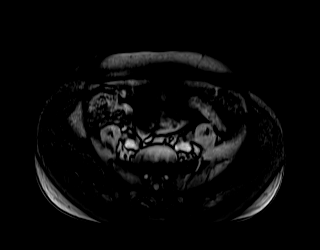
[im 32/64]
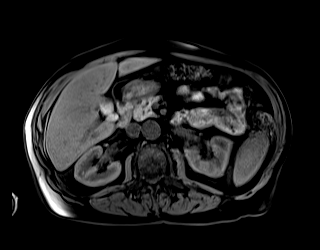
[im 64/64]
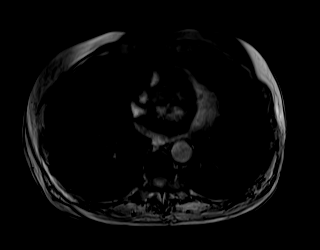

[Series 11: axial dynamic post · axial · 4.0mm · 1.25mm/px · z∈[-206,+46]mm · 3 of 64 slices shown (1 of 6)]
[im 1/64]
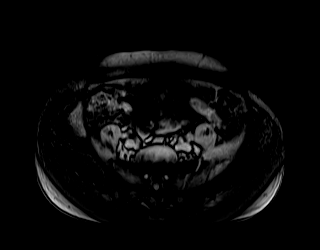
[im 32/64]
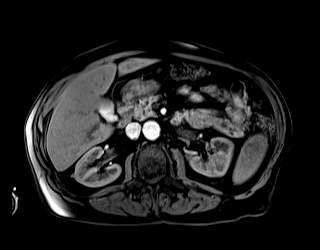
[im 64/64]
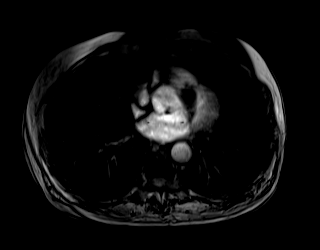

[Series 12: axial dynamic post · axial · 4.0mm · 1.25mm/px · z∈[-206,+46]mm · 3 of 64 slices shown (2 of 6)]
[im 1/64]
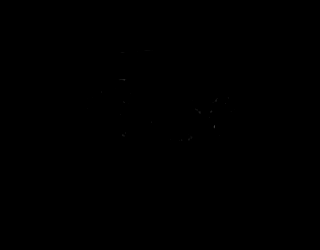
[im 32/64]
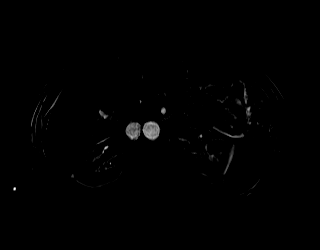
[im 64/64]
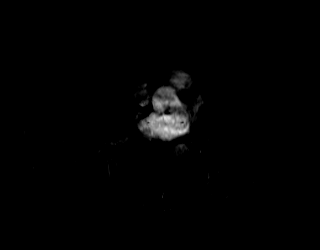

[Series 13: axial dynamic post · axial · 4.0mm · 1.25mm/px · z∈[-206,+46]mm · 3 of 64 slices shown (3 of 6)]
[im 1/64]
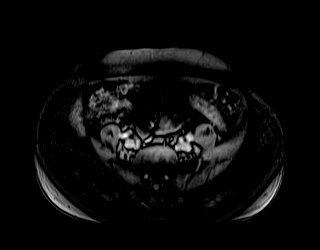
[im 32/64]
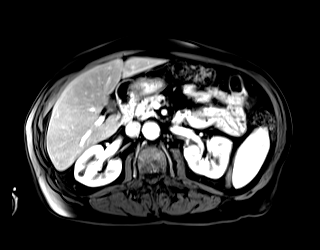
[im 64/64]
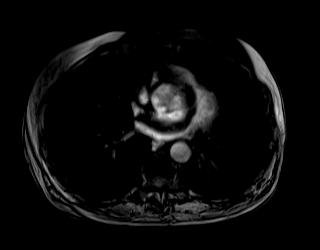

[Series 14: axial dynamic post · axial · 4.0mm · 1.25mm/px · z∈[-206,+46]mm · 3 of 64 slices shown (4 of 6)]
[im 1/64]
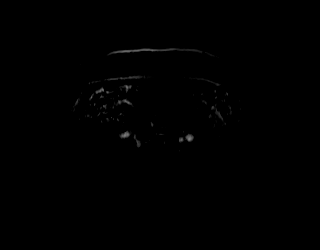
[im 32/64]
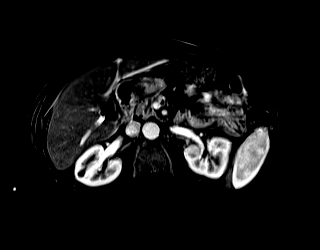
[im 64/64]
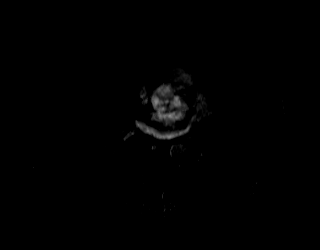

[Series 15: axial dynamic post · axial · 4.0mm · 1.25mm/px · z∈[-206,+46]mm · 3 of 64 slices shown (5 of 6)]
[im 1/64]
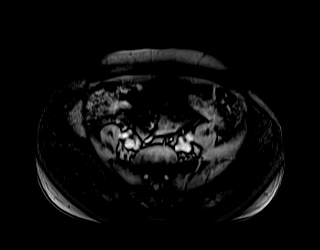
[im 32/64]
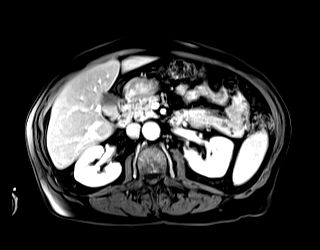
[im 64/64]
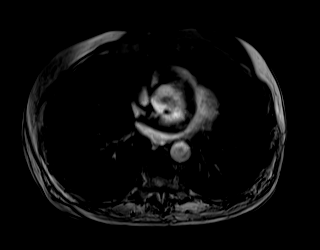

[Series 16: axial dynamic post · axial · 4.0mm · 1.25mm/px · z∈[-206,+46]mm · 3 of 64 slices shown (6 of 6)]
[im 1/64]
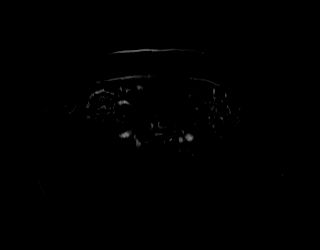
[im 32/64]
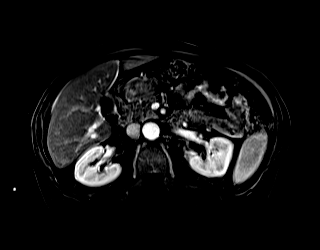
[im 64/64]
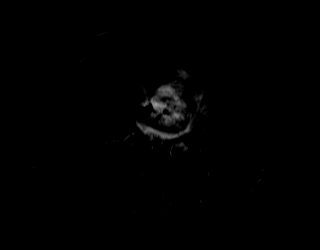

[Series 18: axial dynamic 3 · axial · 4.0mm · 1.25mm/px · z∈[-206,+46]mm · 3 of 64 slices shown (1 of 2)]
[im 1/64]
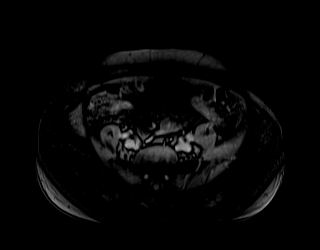
[im 32/64]
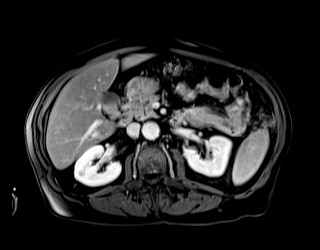
[im 64/64]
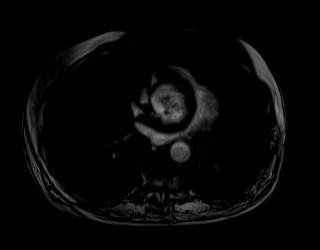

[Series 19: axial dynamic 3 · axial · 4.0mm · 1.25mm/px · z∈[-206,+46]mm · 3 of 64 slices shown (2 of 2)]
[im 1/64]
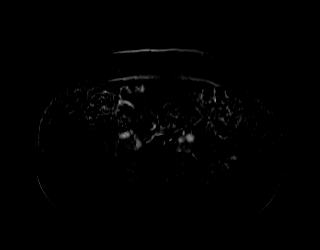
[im 32/64]
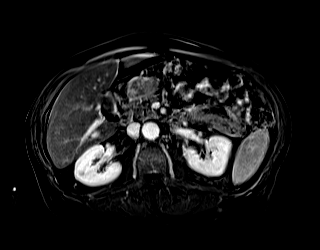
[im 64/64]
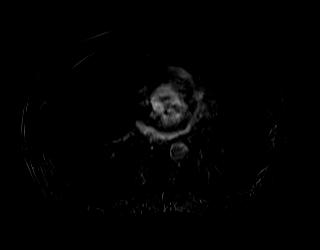

[43 of 48 positions shown; findings below may reference images not displayed]

FINDINGS: Lower chest: No acute findings.

Hepatobiliary: No hepatic masses identified. A few scattered tiny
sub-cm cysts are again noted. Mild diffuse hepatic steatosis again
seen. Gallbladder is unremarkable. No evidence of biliary ductal
dilatation.

Pancreas:  No mass or inflammatory changes.

Spleen:  Within normal limits in size and appearance.

Adrenals/Urinary Tract: Normal adrenal glands. 1.4 cm subcapsular
simple cyst is again seen arising from the upper pole of the left
kidney. A 1 cm simple cyst is again seen in the midpole of the right
kidney.

There is also a subcapsular lesion in the upper pole of the right
kidney which measures 12 mm and shows T2 hyperintensity and
heterogeneous contrast enhancement on subtraction imaging. This is
consistent with a small renal cell carcinoma, but is stable since
recent study. A 3 mm subcapsular lesion in the lateral midpole the
right kidney on image 35/10 is difficult to characterize due to its
small size but remains stable and shows no definite evidence of
contrast enhancement.

Stomach/Bowel: Diverticulosis is seen involving the visualized
portions of the colon, however there is no evidence of
diverticulitis in these regions.

Vascular/Lymphatic: No pathologically enlarged lymph nodes
identified. No abdominal aortic aneurysm. No evidence of renal vein
or IVC thrombus.

Other:  None.

Musculoskeletal:  No suspicious bone lesions identified.
IMPRESSION: 12 mm subcapsular lesion in upper pole of the right kidney shows
diffuse contrast enhancement, consistent with a small renal cell
carcinoma. Unchanged in size since recent study.

3 mm subcapsular lesion in lateral midpole of right kidney is
difficult to characterize due to its small size, but remains stable
and likely represents a tiny proteinaceous or hemorrhagic cyst.
Recommend continued attention on follow-up imaging.

No evidence of abdominal metastatic disease.

Mild hepatic steatosis.

## 2020-10-11 MED ORDER — GADOBUTROL 1 MMOL/ML IV SOLN
6.0000 mL | Freq: Once | INTRAVENOUS | Status: AC | PRN
  Administered 2020-10-11: 6 mL via INTRAVENOUS

## 2020-10-15 ENCOUNTER — Other Ambulatory Visit: Payer: Self-pay

## 2020-10-15 ENCOUNTER — Ambulatory Visit
Admission: RE | Admit: 2020-10-15 | Discharge: 2020-10-15 | Disposition: A | Discharge status: Home / Self Care | Payer: Medicare HMO | Source: Ambulatory Visit | Attending: Cardiology | Admitting: Cardiology

## 2020-10-15 DIAGNOSIS — R0602 Shortness of breath: Secondary | ICD-10-CM | POA: Insufficient documentation

## 2020-10-15 IMAGING — CT CT CHEST W/O CM
2 of 4 series · 15 of 36 positions shown, 18 images · non-contrast
Comparison: None.

CLINICAL DATA: Congestion. History of shortness of breath on
exertion in a patient with pulmonary emphysema

EXAM:
CT CHEST WITHOUT CONTRAST
TECHNIQUE: Multidetector CT imaging of the chest was performed following the
standard protocol without IV contrast.

[Series 2: chest 2.00 · axial · 0.66mm/px · z∈[-1254,-948]mm · 12 of 181 slices shown, 15 images]
[im 14/181  mediastinal]
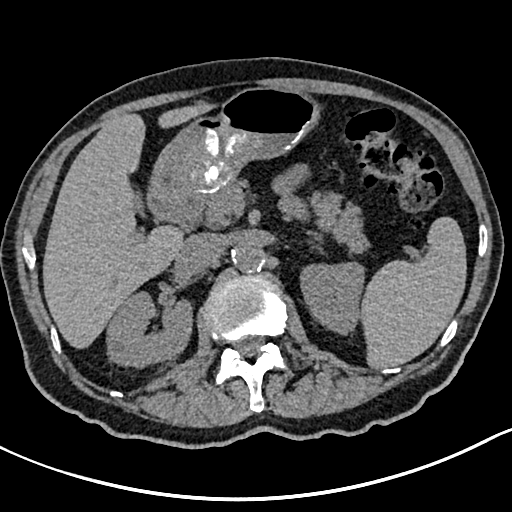
[im 14/181  lung]
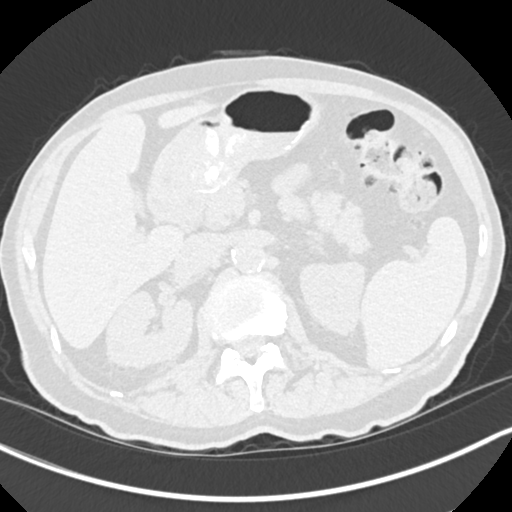
[im 28/181  lung]
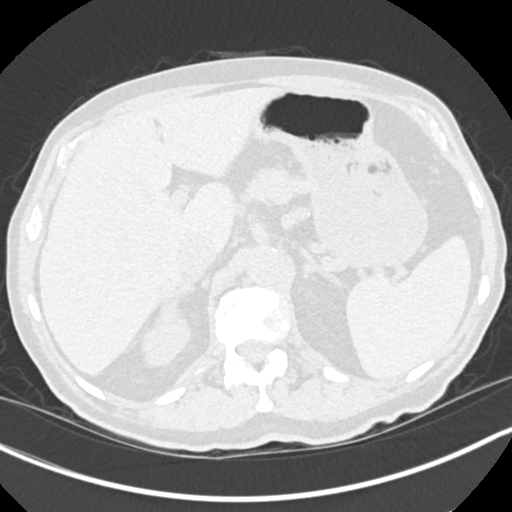
[im 42/181  lung]
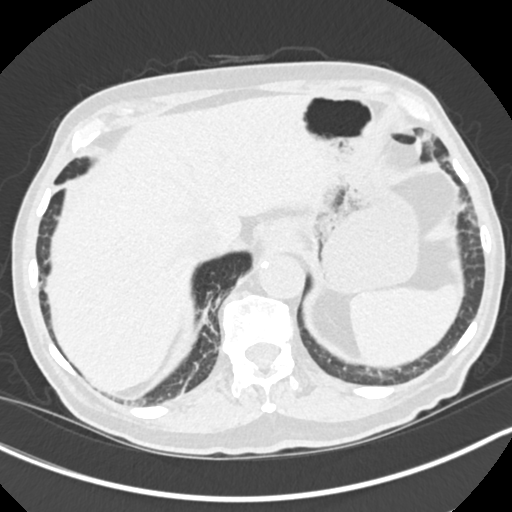
[im 56/181  lung]
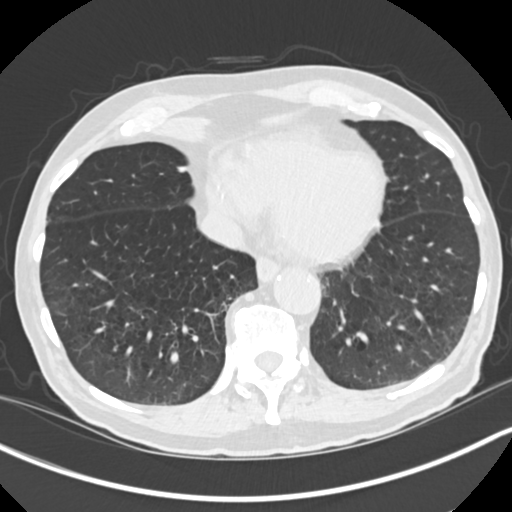
[im 70/181  mediastinal]
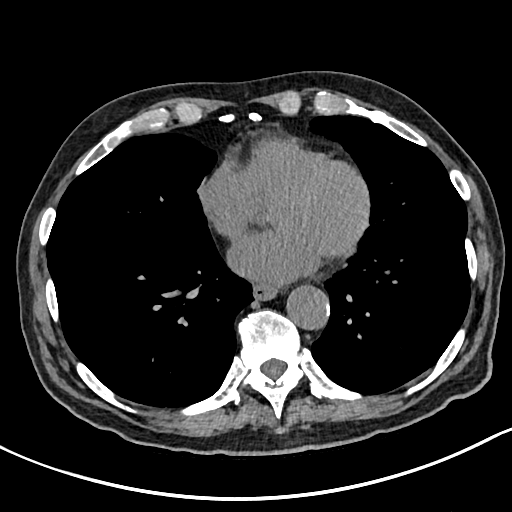
[im 70/181  lung]
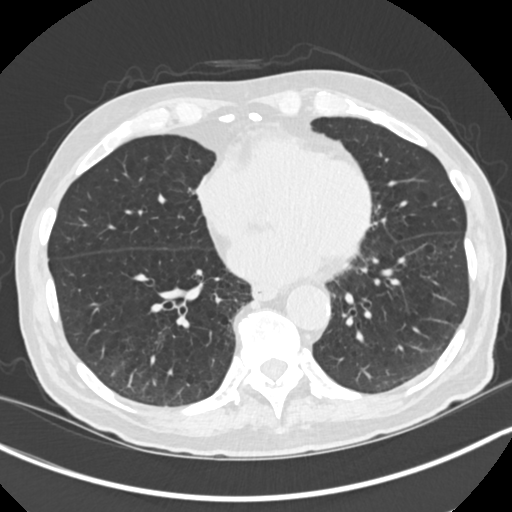
[im 84/181  lung]
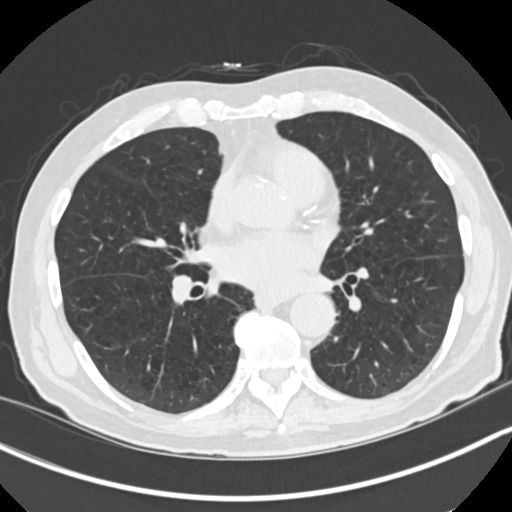
[im 97/181  lung]
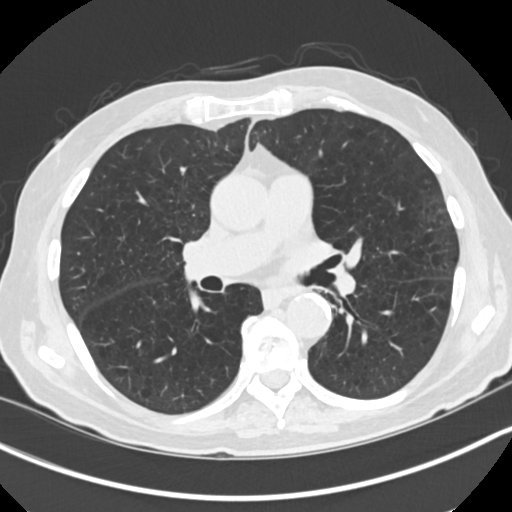
[im 111/181  lung]
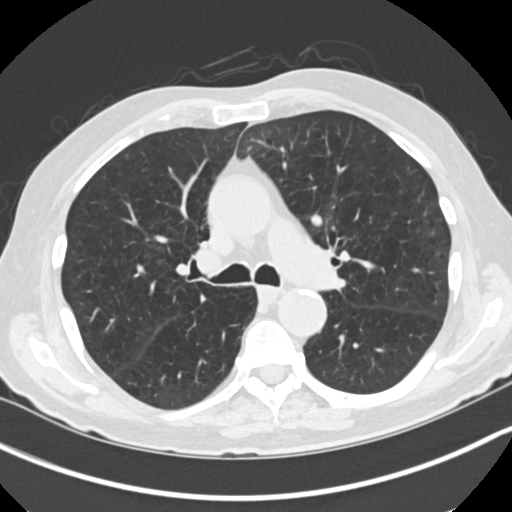
[im 125/181  mediastinal]
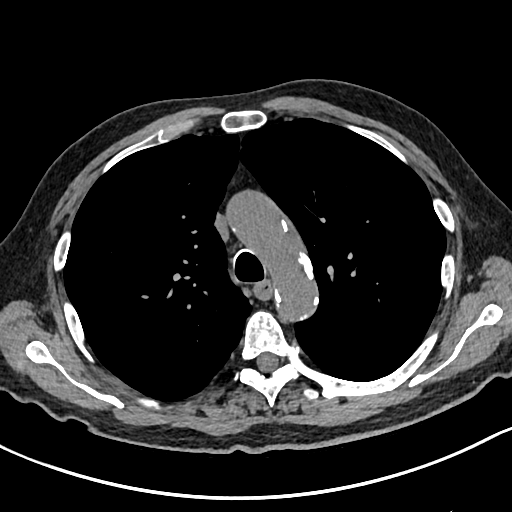
[im 125/181  lung]
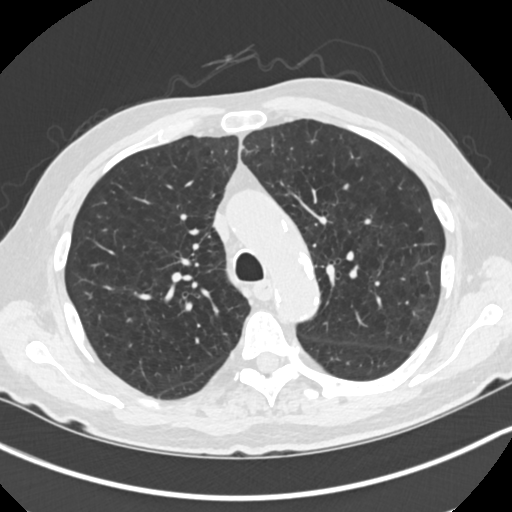
[im 139/181  lung]
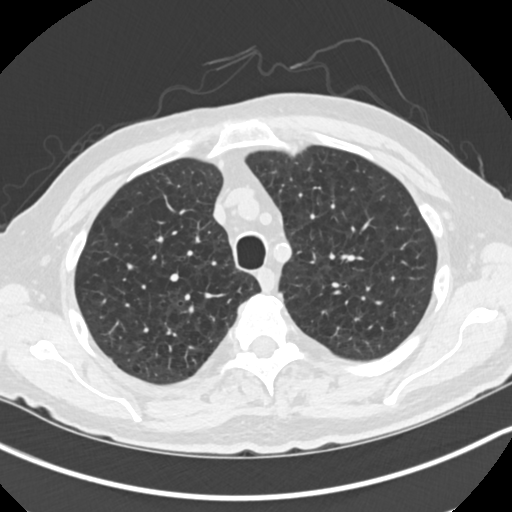
[im 153/181  lung]
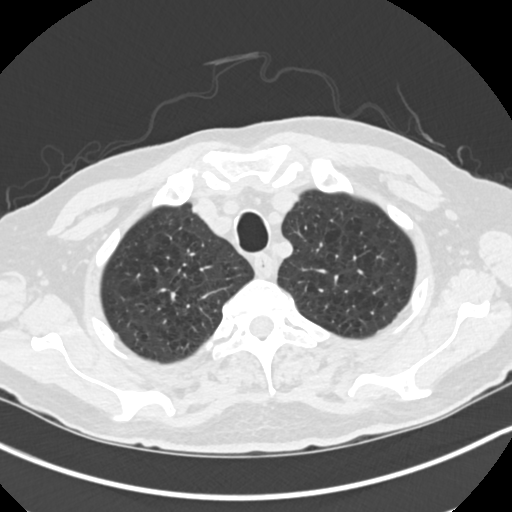
[im 167/181  lung]
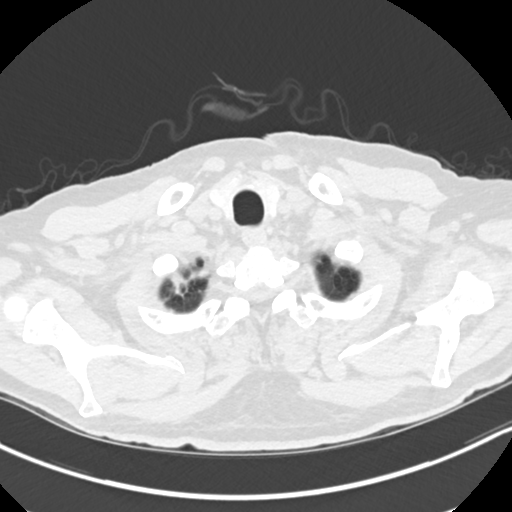

[Series 5: coronals chest 2.00 cor · coronal · 0.66mm/px · 3 of 134 slices shown]
[im 27/134  lung]
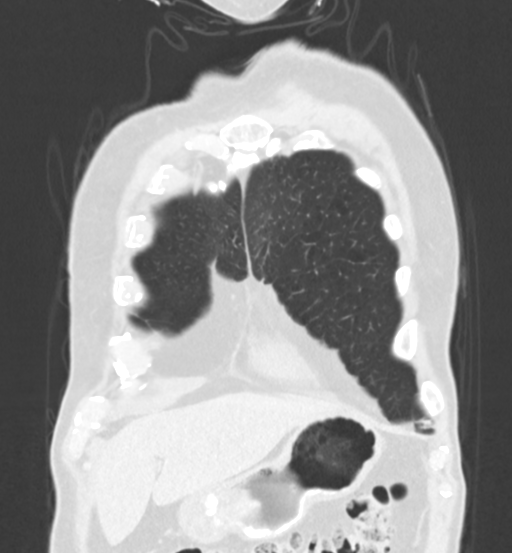
[im 54/134  lung]
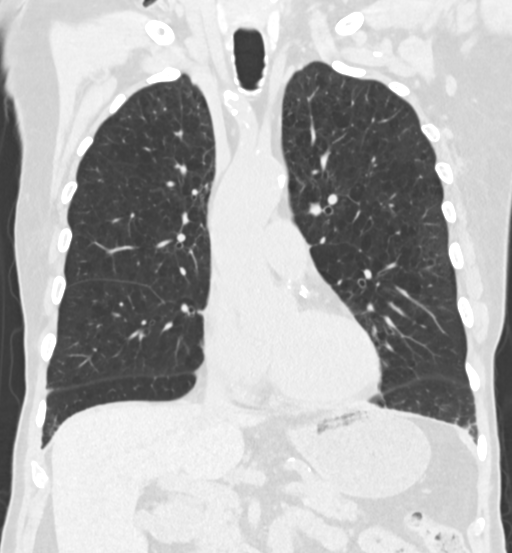
[im 80/134  lung]
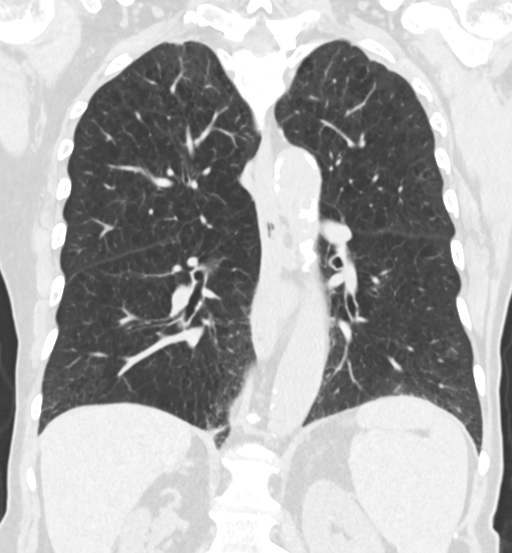

[15 of 36 positions shown; findings below may reference images not displayed]

FINDINGS: Cardiovascular: Calcified atheromatous plaque in the thoracic aorta.
Three-vessel coronary artery calcification. Normal heart size
without pericardial effusion. Normal caliber of the thoracic aorta.
Normal caliber of central pulmonary vessels. Limited assessment of
cardiovascular structures given lack of intravenous contrast.

Mediastinum/Nodes: Thoracic inlet structures are normal. No axillary
lymphadenopathy. No mediastinal or gross hilar lymphadenopathy.
Esophagus is grossly normal.

Lungs/Pleura: Marked pulmonary emphysema worse at the lung apices.
No effusion. No consolidation. Basilar atelectasis. Airways are
patent.

Upper Abdomen: Imaged portions of liver, gallbladder, pancreas and
spleen are unremarkable. The adrenal glands are normal.

Small lesions in the bilateral kidneys better characterized on
recent MRI of the abdomen. Please refer to this MRI of the abdomen
from [DATE] for further detail.

Musculoskeletal: Spinal degenerative changes without acute or
destructive bone process.
IMPRESSION: 1. Marked pulmonary emphysema worse at the lung apices. No acute
cardiopulmonary findings.
2. Three-vessel coronary artery calcification.
3. Small lesions in the bilateral kidneys better characterized on
recent MRI of the abdomen. Please refer to this MRI of the abdomen
from [DATE] for further detail.
4. Emphysema and aortic atherosclerosis.

Aortic Atherosclerosis ([WT]-[WT]) and Emphysema ([WT]-[WT]).

## 2020-10-23 DIAGNOSIS — Z789 Other specified health status: Secondary | ICD-10-CM | POA: Insufficient documentation

## 2020-10-23 DIAGNOSIS — I2510 Atherosclerotic heart disease of native coronary artery without angina pectoris: Secondary | ICD-10-CM | POA: Insufficient documentation

## 2020-10-23 DIAGNOSIS — I700 Atherosclerosis of aorta: Secondary | ICD-10-CM | POA: Insufficient documentation

## 2020-10-30 ENCOUNTER — Other Ambulatory Visit: Payer: Self-pay

## 2020-10-30 ENCOUNTER — Ambulatory Visit (INDEPENDENT_AMBULATORY_CARE_PROVIDER_SITE_OTHER): Payer: Medicare HMO | Admitting: Urology

## 2020-10-30 VITALS — BP 131/91 | HR 76 | Ht 68.0 in | Wt 147.0 lb

## 2020-10-30 DIAGNOSIS — Z8546 Personal history of malignant neoplasm of prostate: Secondary | ICD-10-CM | POA: Diagnosis not present

## 2020-10-30 DIAGNOSIS — N2889 Other specified disorders of kidney and ureter: Secondary | ICD-10-CM

## 2020-10-30 DIAGNOSIS — N200 Calculus of kidney: Secondary | ICD-10-CM

## 2020-10-30 NOTE — Progress Notes (Signed)
10/30/2020 11:50 AM   [place] 02/11/1941 [id number]  Referring provider: Tracie Harrier, MD [address] [facility] [place],  [place] [address]  Chief Complaint  Patient presents with  . history of prostate cancer    HPI: 80 year old male with right renal mass who presents today for 56-month follow-up.  Is found to have an incidental 12 mm right renal mass concerning for renal cell carcinoma.  Please see previous notes for details.  He follows up today with serial MRI 4 months after the initial.  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  The size is unchanged from 4 months ago.  He also has another small 3 mm right midpole lesion too small to characterize.  He also has an incidental kidney stone for which he is asymptomatic.  No changes in his medical history since last visit.  No flank pain or gross hematuria.   PMH: Past Medical History:  Diagnosis Date  . COPD (chronic obstructive pulmonary disease) ([place])   . Dysrhythmia     Surgical History: No past surgical history on file.  Home Medications:  Allergies as of 10/30/2020   No Known Allergies     Medication List       Accurate as of Oct 30, 2020 11:50 AM. If you have any questions, ask your nurse or doctor.        STOP taking these medications   DULERA IN Stopped by: [name], MD   mometasone-formoterol 100-5 MCG/ACT Aero Commonly known as: DULERA Stopped by: [name], MD   valACYclovir 1000 MG tablet Commonly known as: Valtrex Stopped by: [name], MD     TAKE these medications   amiodarone 200 MG tablet Commonly known as: Pacerone Take 2 tablets (400 mg total) by mouth 2 (two) times daily. What changed: when to take this   folic acid 299 MCG tablet Commonly known as: FOLVITE Take 400 mcg by mouth daily.   glucosamine-chondroitin  500-400 MG tablet Take 1 tablet by mouth 2 (two) times daily.   metoprolol succinate 25 MG 24 hr tablet Commonly known as: TOPROL-XL Take 25 mg by mouth daily.   milk thistle 175 MG tablet Take 175 mg by mouth daily.   olmesartan 40 MG tablet Commonly known as: BENICAR Take by mouth.   ONE-A-DAY MENS PO Take 1 tablet by mouth daily.   potassium chloride SA 20 MEQ tablet Commonly known as: KLOR-CON Take 1 tablet (20 mEq total) by mouth daily.   pravastatin 40 MG tablet Commonly known as: PRAVACHOL Take 40 mg by mouth daily.   rivaroxaban 20 MG Tabs tablet Commonly known as: XARELTO Take 20 mg by mouth daily with supper.   sertraline 100 MG tablet Commonly known as: ZOLOFT Take 150 mg by mouth daily.   sertraline 50 MG tablet Commonly known as: ZOLOFT Take 1 tablet by mouth daily.   tamsulosin 0.4 MG Caps capsule Commonly known as: FLOMAX Take 0.4 mg by mouth daily.   Trelegy Ellipta 100-62.5-25 MCG/INH Aepb Generic drug: Fluticasone-Umeclidin-Vilant Inhale 1 puff into the lungs daily.       Allergies: No Known Allergies  Family History: Family History  Problem Relation Age of Onset  . COPD Mother     Social History:  reports that he has quit smoking. He has never used smokeless tobacco. He reports current alcohol use. He reports that he does not use  drugs.   Physical Exam: BP (!) 131/91   Pulse 76   Ht 5\' 8"  (1.727 m)   Wt 147 lb (66.7 kg)   BMI 22.35 kg/m   Constitutional:  Alert and oriented, No acute distress. HEENT: [place] AT, moist mucus membranes.  Trachea midline, no masses. Cardiovascular: No clubbing, cyanosis, or edema. Respiratory: Normal respiratory effort, no increased work of breathing. Skin: No rashes, bruises or suspicious lesions. Neurologic: Grossly intact, no focal deficits, moving all 4 extremities. Psychiatric: Normal mood and affect.  Laboratory Data: Lab Results  Component Value Date   WBC 5.3 01/19/2018   HGB 16.0  01/19/2018   HCT 46.1 01/19/2018   MCV 94.5 01/19/2018   PLT 185 01/19/2018    Lab Results  Component Value Date   CREATININE 1.10 06/11/2020    Pertinent Imaging: MRI of the abdomen with and without contrast from 10/12/2020 images personally reviewed.  There is been no interval growth of the 12 mm lesion which does appear to enhance and consistent with probable RCC.  IMPRESSION: 12 mm subcapsular lesion in upper pole of the right kidney shows diffuse contrast enhancement, consistent with a small renal cell carcinoma. Unchanged in size since recent study.  3 mm subcapsular lesion in lateral midpole of right kidney is difficult to characterize due to its small size, but remains stable and likely represents a tiny proteinaceous or hemorrhagic cyst. Recommend continued attention on follow-up imaging.  No evidence of abdominal metastatic disease.  Mild hepatic steatosis.   Electronically Signed   By: Marlaine Hind M.D.   On: 10/12/2020 11:22  Assessment & Plan:    1. Renal mass Small right upper pole enhancing renal mass, 12 mm without interval growth x4 months  We discussed various options and in light of his age comorbidities and lack of interval growth, more strongly recommended continue observation.  We discussed the metastatic annual risk for the small renal lesions which are quite low.  This appears to be a probable indolent malignancy.  Through shared decision making, he is agreed to repeat the MRI in 1 year.  If there is significant interval growth, he may be a good candidate for ablation.  - MR Abdomen W Wo Contrast; Future  2. Other specified disorders of kidney and ureter As above - MR Abdomen W Wo Contrast; Future  3. Nephrolithiasis Asymptomatic, will follow clinically  4. History of prostate cancer And for annual PSA   Follow-up in 1 year with MRI prior  [name], MD  [facility] [address], [place], [place] [address] [phone]

## 2021-03-21 ENCOUNTER — Other Ambulatory Visit: Payer: Self-pay

## 2021-03-21 ENCOUNTER — Emergency Department
Admission: EM | Admit: 2021-03-21 | Discharge: 2021-03-22 | Disposition: A | Discharge status: Home / Self Care | Payer: Medicare HMO | Attending: Emergency Medicine | Admitting: Emergency Medicine

## 2021-03-21 DIAGNOSIS — Z85828 Personal history of other malignant neoplasm of skin: Secondary | ICD-10-CM | POA: Diagnosis not present

## 2021-03-21 DIAGNOSIS — Z5321 Procedure and treatment not carried out due to patient leaving prior to being seen by health care provider: Secondary | ICD-10-CM | POA: Insufficient documentation

## 2021-03-21 DIAGNOSIS — L7622 Postprocedural hemorrhage and hematoma of skin and subcutaneous tissue following other procedure: Secondary | ICD-10-CM | POA: Insufficient documentation

## 2021-03-21 NOTE — ED Triage Notes (Signed)
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 Pt states is now having slight bleeding from area. No bleeding noted currently. Pt appears in no acute distress, pt is on xarelto.

## 2021-06-03 ENCOUNTER — Telehealth (INDEPENDENT_AMBULATORY_CARE_PROVIDER_SITE_OTHER): Payer: Medicare HMO | Admitting: Nurse Practitioner

## 2021-06-03 ENCOUNTER — Other Ambulatory Visit: Payer: Self-pay

## 2021-06-03 DIAGNOSIS — U071 COVID-19: Secondary | ICD-10-CM

## 2021-06-03 NOTE — Progress Notes (Signed)
Careteam: Patient Care Team: Tracie Harrier, MD as PCP - General (Internal Medicine) Corey Skains, MD as Consulting Physician (Cardiology)  Advanced Directive information    No Known Allergies  Chief Complaint  Patient presents with   Acute Visit    Patient tested positive for COVID on yesterday. Patient has cough with mucous. Patient had low grade fever. No fatigue. Patient started having shakes after visiting family December 19-22. Patient has history of emphysema. No sore throat, no headache. Patient does complain of body aches.     HPI: Patient is a 80 y.o. male for positive COVID test. Unable to connect virtually via video therefore  phone call was done.  Reports pt started having symptoms 4 days ago with cough and mucous.  He has had all COVID shots and boosters.   Prior to then had cough without mucous.  No chills. Minimally fever 99.2 yesterday morning. Did not check temperature today.  Minor aches today (unsure if this is due to COVID or age/arthritis).  Able to drink water well. No Nausea or vomiting.  He has not taken anything for symptoms management.   Hard to tell if he is getting better.  Hx of COPD (pulmonary emphysema), a fib, htn, prostate cancer  and was a heavy smoker. Reports he is chronically short of breath.  Reports for months his shortness of breath is worse and increasingly hard for him to do activities.  He is unsure about what medications is he taking "reports he can not remember"  Nasal congestion present  Unsure if chest congestion is worse.    Review of Systems:  Review of Systems  Constitutional:  Negative for chills, fever, malaise/fatigue and weight loss.  HENT:  Negative for congestion, sore throat and tinnitus.   Respiratory:  Positive for cough, sputum production and shortness of breath (at baseline).   Cardiovascular:  Negative for chest pain, palpitations and leg swelling.  Gastrointestinal:  Negative for abdominal pain,  constipation, diarrhea and heartburn.  Genitourinary:  Negative for dysuria, frequency and urgency.  Musculoskeletal:  Negative for back pain, falls, joint pain and myalgias.  Skin: Negative.   Neurological:  Negative for dizziness and headaches.  Psychiatric/Behavioral:  Negative for depression and memory loss. The patient does not have insomnia.    Past Medical History:  Diagnosis Date   COPD (chronic obstructive pulmonary disease) ([place])    Dysrhythmia    No past surgical history on file. Social History:   reports that he has quit smoking. He has never used smokeless tobacco. He reports current alcohol use. He reports that he does not use drugs.  Family History  Problem Relation Age of Onset   COPD Mother     Medications: Patient's Medications  New Prescriptions   No medications on file  Previous Medications   AMIODARONE (PACERONE) 200 MG TABLET    Take 2 tablets (400 mg total) by mouth 2 (two) times daily.   GLUCOSAMINE-CHONDROITIN 500-400 MG TABLET    Take 1 tablet by mouth 2 (two) times daily.    METOPROLOL SUCCINATE (TOPROL-XL) 25 MG 24 HR TABLET    Take 25 mg by mouth daily.   MULTIPLE VITAMIN (ONE-A-DAY MENS PO)    Take 1 tablet by mouth daily.   OLMESARTAN (BENICAR) 40 MG TABLET    Take by mouth.   POTASSIUM CHLORIDE SA (K-DUR,KLOR-CON) 20 MEQ TABLET    Take 1 tablet (20 mEq total) by mouth daily.   PRAVASTATIN (PRAVACHOL) 40 MG TABLET  Take 40 mg by mouth daily.   RIVAROXABAN (XARELTO) 20 MG TABS TABLET    Take 20 mg by mouth daily with supper.   SERTRALINE (ZOLOFT) 100 MG TABLET    Take 150 mg by mouth daily.   SERTRALINE (ZOLOFT) 50 MG TABLET    Take 1 tablet by mouth daily.   TAMSULOSIN (FLOMAX) 0.4 MG CAPS CAPSULE    Take 0.4 mg by mouth daily.   TRELEGY ELLIPTA 100-62.5-25 MCG/INH AEPB    Inhale 1 puff into the lungs daily.  Modified Medications   No medications on file  Discontinued Medications   FOLIC ACID (FOLVITE) 409 MCG TABLET    Take 400 mcg by mouth  daily.   MILK THISTLE 175 MG TABLET    Take 175 mg by mouth daily.    Physical Exam:  There were no vitals filed for this visit. There is no height or weight on file to calculate BMI. Wt Readings from Last 3 Encounters:  10/30/20 147 lb (66.7 kg)  10/11/20 150 lb (68 kg)  06/25/20 150 lb (68 kg)    Physical Exam Pt able to carry conversation without being breathless.  Oriented and appropriate   Labs reviewed: Basic Metabolic Panel: Recent Labs    06/11/20 0840  CREATININE 1.10   Liver Function Tests: No results for input(s): AST, ALT, ALKPHOS, BILITOT, PROT, ALBUMIN in the last 8760 hours. No results for input(s): LIPASE, AMYLASE in the last 8760 hours. No results for input(s): AMMONIA in the last 8760 hours. CBC: No results for input(s): WBC, NEUTROABS, HGB, HCT, MCV, PLT in the last 8760 hours. Lipid Panel: No results for input(s): CHOL, HDL, LDLCALC, TRIG, CHOLHDL, LDLDIRECT in the last 8760 hours. TSH: No results for input(s): TSH in the last 8760 hours. A1C: No results found for: HGBA1C   Assessment/Plan  1. COVID-19 -he is not interested in anti-viral medication at this time. Reports symptoms are not severe and not interested in emergency use medications at this time.  -continue supportive care.  Netipot or saline wash daily Plain nasal saline spray throughout the day as needed May use tylenol 500 mg 2 tablets every 8 hours as needed aches and pains or sore throat humidifier in the home to help with the dry air Mucinex DM by mouth twice daily as needed for cough and chest congestion with full glass of water  Keep well hydrated Proper nutrition  Avoid forcefully blowing nose Vit c 1000 mg daily Vit d 2000 units daily  Zinc 50 mg daily  -to sit up, deep breathing recommended -follow up instructions given.  -Your test for COVID-19 was positive, meaning that you were infected with the coronavirus and could give the germ to others.  Please continue isolation  at home for at least 5 days since the start of your symptoms. On day 5, as long as your symptoms are improving and you are not having a fever, you can return to normal activities, ensuring you are wearing a mask at all times when not at home.     Once you complete your 10 days you are good to resume normal acitivities.    Next appt:  Carlos American. Harle Battiest  [facility] [phone]    Virtual Visit via telephone   I connected with patient on 06/03/21 at 10:30 AM EST by telephone and verified that I am speaking with the correct person using two identifiers.  Location: Patient: home Provider: [place]   I discussed  the limitations, risks, security and privacy concerns of performing an evaluation and management service by telephone and the availability of in person appointments. I also discussed with the patient that there may be a patient responsible charge related to this service. The patient expressed understanding and agreed to proceed.   I discussed the assessment and treatment plan with the patient. The patient was provided an opportunity to ask questions and all were answered. The patient agreed with the plan and demonstrated an understanding of the instructions.   The patient was advised to call back or seek an in-person evaluation if the symptoms worsen or if the condition fails to improve as anticipated.  I provided 22 minutes of non-face-to-face time during this encounter.  Carlos American. Harle Battiest Avs printed and mailed

## 2021-06-03 NOTE — Progress Notes (Signed)
This service is provided via telemedicine  No vital signs collected/recorded due to the encounter was a telemedicine visit.   Location of patient (ex: home, work):  Home  Patient consents to a telephone visit:  Yes, see encounter dated 06/03/2021  Location of the provider (ex: office, home):  [place]  Name of any referring provider:  Tracie Harrier, MD  Names of all persons participating in the telemedicine service and their role in the encounter:  Sherrie Mustache, Nurse Practitioner, Carroll Kinds, CMA, and patient.   Time spent on call:  12 minutes with medical assistant

## 2021-08-12 ENCOUNTER — Institutional Professional Consult (permissible substitution): Payer: Medicare HMO | Admitting: Internal Medicine

## 2021-08-18 ENCOUNTER — Telehealth: Payer: Self-pay

## 2021-08-18 NOTE — Telephone Encounter (Signed)
Scheduled for 11/25/2021 ?

## 2021-08-28 ENCOUNTER — Other Ambulatory Visit: Payer: Self-pay

## 2021-08-28 ENCOUNTER — Encounter: Payer: Self-pay | Admitting: Emergency Medicine

## 2021-08-28 ENCOUNTER — Ambulatory Visit (INDEPENDENT_AMBULATORY_CARE_PROVIDER_SITE_OTHER): Payer: Medicare HMO | Admitting: Emergency Medicine

## 2021-08-28 DIAGNOSIS — J439 Emphysema, unspecified: Secondary | ICD-10-CM

## 2021-08-28 DIAGNOSIS — R053 Chronic cough: Secondary | ICD-10-CM | POA: Diagnosis not present

## 2021-08-28 MED ORDER — ALBUTEROL SULFATE HFA 108 (90 BASE) MCG/ACT IN AERS: 2.0000 | INHALATION_SPRAY | Freq: Four times a day (QID) | RESPIRATORY_TRACT | 6 refills | Status: DC | PRN

## 2021-08-28 NOTE — Progress Notes (Signed)
? ?Subjective:  ? ? Patient ID: Logan Morgan, male    DOB: [birth year], 81 y.o.   MRN: [id number] ? ?HPI ?81 year old male with history of former tobacco use (80 + pack years), COPD with emphysema confirmed on CT chest 10/2020.  He is referred today for evaluation of shortness of breath, COPD. ? ?He is on Trelegy, unsure whether he benefits much.  Does not have albuterol ?SOB with bending over, has exertional SOB when he walks less than 50 ft. SOB when walking through his home. SOB w household chores. Can intermittently have some cough, started to be more regular since [place] in 12/22. Some phlegm - yellowish. Hears some wheeze.   ?Daily Nasal congestion, drainage, clear.  ? ? ?CT chest 10/15/2020 reviewed by me showed marked emphysematous changes especially apically, no nodules or infiltrates. ? ?Most recent pulmonary function testing 02/2019 ([facility]): ?Severe obstruction with a positive bronchodilator response.  Normal lung volumes (question pseudonormalization).  Decreased diffusion capacity that does not correct when adjusted for alveolar volume ? ? ?Review of Systems ?As per HPI ? ?Past Medical History:  ?Diagnosis Date  ? COPD (chronic obstructive pulmonary disease) ([place])   ? Dysrhythmia   ?  ? ?Family History  ?Problem Relation Age of Onset  ? COPD Mother   ?  ? ?Social History  ? ?Socioeconomic History  ? Marital status: Married  ?  Spouse name: Not on file  ? Number of children: Not on file  ? Years of education: Not on file  ? Highest education level: Not on file  ?Occupational History  ? Not on file  ?Tobacco Use  ? Smoking status: Former  ? Smokeless tobacco: Never  ?Substance and Sexual Activity  ? Alcohol use: Yes  ?  Comment: vodka daily  ? Drug use: No  ? Sexual activity: Not on file  ?Other Topics Concern  ? Not on file  ?Social History Narrative  ? Not on file  ? ?Social Determinants of Health  ? ?Financial Resource Strain: Not on file  ?Food Insecurity: Not on file  ?Transportation Needs: Not  on file  ?Physical Activity: Not on file  ?Stress: Not on file  ?Social Connections: Not on file  ?Intimate Partner Violence: Not on file  ? Has lived in [place], [place], [place]  ?Dollene Cleveland, Corporate treasurer, Nurse, learning disability ? ?No Known Allergies  ? ?Outpatient Medications Prior to Visit  ?Medication Sig Dispense Refill  ? amiodarone (PACERONE) 200 MG tablet Take 2 tablets (400 mg total) by mouth 2 (two) times daily. (Patient taking differently: Take 400 mg by mouth daily.) 60 tablet 0  ? glucosamine-chondroitin 500-400 MG tablet Take 1 tablet by mouth 2 (two) times daily.     ? metoprolol succinate (TOPROL-XL) 25 MG 24 hr tablet Take 25 mg by mouth daily.    ? Multiple Vitamin (ONE-A-DAY MENS PO) Take 1 tablet by mouth daily.    ? olmesartan (BENICAR) 40 MG tablet Take by mouth.    ? sertraline (ZOLOFT) 100 MG tablet Take 150 mg by mouth daily.    ? sertraline (ZOLOFT) 50 MG tablet Take 1 tablet by mouth daily.    ? tamsulosin (FLOMAX) 0.4 MG CAPS capsule Take 0.4 mg by mouth daily.    ? TRELEGY ELLIPTA 100-62.5-25 MCG/INH AEPB Inhale 1 puff into the lungs daily.    ? potassium chloride SA (K-DUR,KLOR-CON) 20 MEQ tablet Take 1 tablet (20 mEq total) by mouth daily. (Patient not taking: Reported on 08/28/2021) 30 tablet 6  ?  pravastatin (PRAVACHOL) 40 MG tablet Take 40 mg by mouth daily. (Patient not taking: Reported on 08/28/2021)    ? rivaroxaban (XARELTO) 20 MG TABS tablet Take 20 mg by mouth daily with supper. (Patient not taking: Reported on 08/28/2021)    ? ?No facility-administered medications prior to visit.  ? ? ? ? ?   ?Objective:  ? Physical Exam ? ?Vitals:  ? 08/28/21 1142  ?BP: 126/72  ?Pulse: 76  ?Temp: 98 ?F (36.7 ?C)  ?TempSrc: Oral  ?SpO2: 92%  ?Weight: 161 lb (73 kg)  ?Height: '5\' 8"'$  (1.727 m)  ? ? ?Gen: Pleasant, well-nourished, in no distress,  normal affect ? ?ENT: No lesions,  mouth clear,  oropharynx clear, no postnasal drip ? ?Neck: No JVD, no stridor ? ?Lungs: No use of accessory muscles, distant, no wheeze on a normal  breath, he does wheeze bilaterally on forced expiration ? ?Cardiovascular: RRR, heart sounds normal, no murmur or gallops, no peripheral edema ? ?Musculoskeletal: No deformities, no cyanosis or clubbing ? ?Neuro: alert, awake, non focal ? ?Skin: Warm, no lesions or rash ?   ?Assessment & Plan:  ? ?Chronic cough ?Has been present since he had COVID-19 in December 2022.  Suspect this was the inciting event, sustained by chronic allergic rhinitis.  He denies any GERD.  Does not seem to be that bothersome to him.  I did discuss possibly treating underlying contributors.  We could pursue this further going forward if symptoms become more bothersome. ? ?Chronic obstructive pulmonary emphysema ?Severe obstruction based on his previous pulmonary function testing available from 2020.  He is on Trelegy, unclear whether he gets much benefit.  He does not use albuterol.  He is dyspneic with exertion and needs to be evaluated for possible occult exertional hypoxemia.  We will do this today.  Continue Trelegy for now, consider alternative going forward.  We will repeat his pulmonary function testing to compare with priors, assess his degree of obstruction. ? ? ?Baltazar Apo, MD, PhD ?08/28/2021, 1:51 PM ?[facility] ?[phone] or if no answer before 7:00PM call [phone] ?For any issues after 7:00PM please call eLink [phone] ? ?

## 2021-08-28 NOTE — Assessment & Plan Note (Signed)
Has been present since he had COVID-19 in December 2022.  Suspect this was the inciting event, sustained by chronic allergic rhinitis.  He denies any GERD.  Does not seem to be that bothersome to him.  I did discuss possibly treating underlying contributors.  We could pursue this further going forward if symptoms become more bothersome. ?

## 2021-08-28 NOTE — Patient Instructions (Addendum)
Please continue Trelegy 1 inhalation once daily.  Rinse and gargle after using. ?We will give you an albuterol inhaler to keep available.  You can use 2 puffs up to every 4 hours if you need it for shortness of breath, chest tightness, wheezing. ?Walking oximetry today on room air ?We will repeat your pulmonary function testing at your next office visit to compare with your priors. ?Follow with Dr. Lamonte Sakai next available with full pulmonary function testing on the same day.  ? ?

## 2021-08-28 NOTE — Assessment & Plan Note (Signed)
Severe obstruction based on his previous pulmonary function testing available from 2020.  He is on Trelegy, unclear whether he gets much benefit.  He does not use albuterol.  He is dyspneic with exertion and needs to be evaluated for possible occult exertional hypoxemia.  We will do this today.  Continue Trelegy for now, consider alternative going forward.  We will repeat his pulmonary function testing to compare with priors, assess his degree of obstruction. ?

## 2021-08-28 NOTE — Addendum Note (Signed)
Addended by: Gavin Potters R on: 08/28/2021 02:32 PM ? ? Modules accepted: Orders ? ?

## 2021-09-02 ENCOUNTER — Telehealth: Payer: Self-pay

## 2021-09-02 NOTE — Telephone Encounter (Signed)
Patient PCP nurse reach out to our office and states PCP wants patient seen before 11/25/21 with Dr. Allen Norris. Called patient and got moved to 09/22/21 with Dr. Allen Norris at 1:00pm In [place]  ?

## 2021-09-08 ENCOUNTER — Encounter: Payer: Self-pay | Admitting: Ophthalmology

## 2021-09-12 NOTE — Discharge Instructions (Signed)

## 2021-09-15 ENCOUNTER — Ambulatory Visit: Payer: Medicare HMO | Admitting: Gastroenterology

## 2021-09-15 ENCOUNTER — Encounter
Admission: RE | Disposition: A | Discharge status: Home / Self Care | Payer: Self-pay | Source: Home / Self Care | Attending: Ophthalmology

## 2021-09-15 ENCOUNTER — Ambulatory Visit
Admission: RE | Admit: 2021-09-15 | Discharge: 2021-09-15 | Disposition: A | Discharge status: Home / Self Care | Payer: Medicare HMO | Attending: Ophthalmology | Admitting: Ophthalmology

## 2021-09-15 ENCOUNTER — Ambulatory Visit: Payer: Medicare HMO | Admitting: Anesthesiology

## 2021-09-15 ENCOUNTER — Encounter: Payer: Self-pay | Admitting: Ophthalmology

## 2021-09-15 ENCOUNTER — Other Ambulatory Visit: Payer: Self-pay

## 2021-09-15 DIAGNOSIS — Z87891 Personal history of nicotine dependence: Secondary | ICD-10-CM | POA: Insufficient documentation

## 2021-09-15 DIAGNOSIS — J449 Chronic obstructive pulmonary disease, unspecified: Secondary | ICD-10-CM | POA: Insufficient documentation

## 2021-09-15 DIAGNOSIS — I4891 Unspecified atrial fibrillation: Secondary | ICD-10-CM | POA: Diagnosis not present

## 2021-09-15 DIAGNOSIS — H2512 Age-related nuclear cataract, left eye: Secondary | ICD-10-CM | POA: Insufficient documentation

## 2021-09-15 DIAGNOSIS — I10 Essential (primary) hypertension: Secondary | ICD-10-CM | POA: Diagnosis not present

## 2021-09-15 HISTORY — DX: Unspecified atrial fibrillation: I48.91

## 2021-09-15 HISTORY — DX: Malignant neoplasm of prostate: C61

## 2021-09-15 HISTORY — PX: CATARACT EXTRACTION W/PHACO: SHX586

## 2021-09-15 HISTORY — DX: Dyspnea, unspecified: R06.00

## 2021-09-15 HISTORY — DX: Emphysema, unspecified: J43.9

## 2021-09-15 SURGERY — PHACOEMULSIFICATION, CATARACT, WITH IOL INSERTION
Anesthesia: Monitor Anesthesia Care | Site: Eye | Laterality: Left

## 2021-09-15 MED ORDER — ONDANSETRON 4 MG PO TBDP
4.0000 mg | ORAL_TABLET | Freq: Once | ORAL | Status: AC
  Administered 2021-09-15: 4 mg via ORAL

## 2021-09-15 MED ORDER — LIDOCAINE HCL (PF) 2 % IJ SOLN
INTRAOCULAR | Status: DC | PRN
  Administered 2021-09-15: 1 mL via INTRAOCULAR

## 2021-09-15 MED ORDER — SIGHTPATH DOSE#1 BSS IO SOLN
INTRAOCULAR | Status: DC | PRN
  Administered 2021-09-15: 93 mL via OPHTHALMIC

## 2021-09-15 MED ORDER — SIGHTPATH DOSE#1 BSS IO SOLN
INTRAOCULAR | Status: DC | PRN
  Administered 2021-09-15: 15 mL

## 2021-09-15 MED ORDER — MOXIFLOXACIN HCL 0.5 % OP SOLN
OPHTHALMIC | Status: DC | PRN
Start: 2021-09-15 — End: 2021-09-15
  Administered 2021-09-15: 0.2 mL via OPHTHALMIC

## 2021-09-15 MED ORDER — LACTATED RINGERS IV SOLN: INTRAVENOUS | Status: DC

## 2021-09-15 MED ORDER — SIGHTPATH DOSE#1 SODIUM HYALURONATE 23 MG/ML IO SOLUTION
PREFILLED_SYRINGE | INTRAOCULAR | Status: DC | PRN
Start: 2021-09-15 — End: 2021-09-15
  Administered 2021-09-15: 0.6 mL via INTRAOCULAR

## 2021-09-15 MED ORDER — SIGHTPATH DOSE#1 SODIUM HYALURONATE 10 MG/ML IO SOLUTION
PREFILLED_SYRINGE | INTRAOCULAR | Status: DC | PRN
  Administered 2021-09-15: 0.85 mL via INTRAOCULAR

## 2021-09-15 MED ORDER — ACETAMINOPHEN 160 MG/5ML PO SOLN: 325.0000 mg | ORAL | Status: DC | PRN

## 2021-09-15 MED ORDER — FENTANYL CITRATE (PF) 100 MCG/2ML IJ SOLN
INTRAMUSCULAR | Status: DC | PRN
  Administered 2021-09-15: 50 ug via INTRAVENOUS

## 2021-09-15 MED ORDER — ACETAMINOPHEN 325 MG PO TABS: 325.0000 mg | ORAL_TABLET | ORAL | Status: DC | PRN

## 2021-09-15 MED ORDER — ARMC OPHTHALMIC DILATING DROPS
1.0000 "application " | OPHTHALMIC | Status: DC | PRN
  Administered 2021-09-15 (×2): 1 via OPHTHALMIC

## 2021-09-15 MED ORDER — TETRACAINE HCL 0.5 % OP SOLN
1.0000 [drp] | OPHTHALMIC | Status: DC | PRN
  Administered 2021-09-15 (×3): 1 [drp] via OPHTHALMIC

## 2021-09-15 MED ORDER — MIDAZOLAM HCL 2 MG/2ML IJ SOLN
INTRAMUSCULAR | Status: DC | PRN
Start: 2021-09-15 — End: 2021-09-15
  Administered 2021-09-15: 1.5 mg via INTRAVENOUS

## 2021-09-15 SURGICAL SUPPLY — 14 items
CATARACT SUITE SIGHTPATH (MISCELLANEOUS) ×2 IMPLANT
DISSECTOR HYDRO NUCLEUS 50X22 (MISCELLANEOUS) ×2 IMPLANT
FEE CATARACT SUITE SIGHTPATH (MISCELLANEOUS) ×1 IMPLANT
GLOVE SURG GAMMEX PI TX LF 7.5 (GLOVE) ×2 IMPLANT
GLOVE SURG SYN 8.5  E (GLOVE) ×1
GLOVE SURG SYN 8.5 E (GLOVE) ×1 IMPLANT
GLOVE SURG SYN 8.5 PF PI (GLOVE) ×1 IMPLANT
LENS IOL TECNIS EYHANCE 22.5 (Intraocular Lens) ×1 IMPLANT
NDL FILTER BLUNT 18X1 1/2 (NEEDLE) ×1 IMPLANT
NEEDLE FILTER BLUNT 18X 1/2SAF (NEEDLE) ×1
NEEDLE FILTER BLUNT 18X1 1/2 (NEEDLE) ×1 IMPLANT
SYR 3ML LL SCALE MARK (SYRINGE) ×2 IMPLANT
SYR 5ML LL (SYRINGE) ×2 IMPLANT
WATER STERILE IRR 250ML POUR (IV SOLUTION) ×2 IMPLANT

## 2021-09-15 NOTE — Addendum Note (Signed)
Addendum  created 09/15/21 1152 by Rochel Brome, MD  ? Order list changed, Pharmacy for encounter modified  ?  ?

## 2021-09-15 NOTE — H&P (Signed)
[place]  ? ?Primary Care Physician:  Tracie Harrier, MD ?Ophthalmologist: Dr. Benay Pillow ? ?Pre-Procedure History & Physical: ?HPI:  Logan Morgan is a 81 y.o. male here for cataract surgery. ?  ?Past Medical History:  ?Diagnosis Date  ? Atrial fibrillation ([place])   ? COPD (chronic obstructive pulmonary disease) ([place])   ? Dyspnea   ? Pt reports due to inactivity  ? Dysrhythmia   ? Emphysema lung ([place])   ? Prostate cancer ([place])   ? ? ?Past Surgical History:  ?Procedure Laterality Date  ? COLONOSCOPY    ? HAND SURGERY    ? x3. as child  ? RADIOACTIVE SEED IMPLANT    ? TONSILLECTOMY    ? as child  ? ? ?Prior to Admission medications   ?Medication Sig Start Date End Date Taking? Authorizing Provider  ?albuterol (VENTOLIN HFA) 108 (90 Base) MCG/ACT inhaler Inhale 2 puffs into the lungs every 6 (six) hours as needed for wheezing or shortness of breath. 08/28/21  Yes Byrum, Rose Fillers, MD  ?amiodarone (PACERONE) 200 MG tablet Take 2 tablets (400 mg total) by mouth 2 (two) times daily. ?Patient taking differently: Take 400 mg by mouth daily. 07/31/16  Yes Epifanio Lesches, MD  ?atorvastatin (LIPITOR) 20 MG tablet Take 20 mg by mouth daily.   Yes [provider]  ?ferrous sulfate 325 (65 FE) MG tablet Take 325 mg by mouth daily.   Yes [provider]  ?furosemide (LASIX) 20 MG tablet Take 20 mg by mouth daily.   Yes [provider]  ?glucosamine-chondroitin 500-400 MG tablet Take 1 tablet by mouth 2 (two) times daily.    Yes [provider]  ?metoprolol succinate (TOPROL-XL) 25 MG 24 hr tablet Take 25 mg by mouth daily.   Yes [provider]  ?Multiple Vitamin (ONE-A-DAY MENS PO) Take 1 tablet by mouth daily.   Yes [provider]  ?olmesartan (BENICAR) 40 MG tablet Take by mouth. 06/07/19  Yes [provider]  ?Omega-3 Fatty Acids (FISH OIL) 1000 MG CAPS Take by mouth daily.   Yes [provider]  ?potassium chloride SA (K-DUR,KLOR-CON)  20 MEQ tablet Take 1 tablet (20 mEq total) by mouth daily. ?Patient taking differently: Take 20 mEq by mouth 2 (two) times daily. 10/26/14  Yes Teodoro Spray, MD  ?pravastatin (PRAVACHOL) 40 MG tablet Take 40 mg by mouth daily.   Yes [provider]  ?sertraline (ZOLOFT) 100 MG tablet Take 150 mg by mouth daily.   Yes [provider]  ?sertraline (ZOLOFT) 50 MG tablet Take 1 tablet by mouth daily. 08/13/20  Yes [provider]  ?tamsulosin (FLOMAX) 0.4 MG CAPS capsule Take 0.4 mg by mouth daily.   Yes [provider]  ?TRELEGY ELLIPTA 100-62.5-25 MCG/INH AEPB Inhale 1 puff into the lungs daily. 04/06/20  Yes [provider]  ?rivaroxaban (XARELTO) 20 MG TABS tablet Take 20 mg by mouth daily with supper. ?Patient not taking: Reported on 08/28/2021    [provider]  ? ? ?Allergies as of 08/19/2021  ? (No Known Allergies)  ? ? ?Family History  ?Problem Relation Age of Onset  ? COPD Mother   ? ? ?Social History  ? ?Socioeconomic History  ? Marital status: Married  ?  Spouse name: Not on file  ? Number of children: Not on file  ? Years of education: Not on file  ? Highest education level: Not on file  ?Occupational History  ? Not on file  ?  Tobacco Use  ? Smoking status: Former  ?  Years: 38.00  ?  Types: Cigarettes  ?  Quit date: 1998  ?  Years since quitting: 25.2  ? Smokeless tobacco: Never  ?Vaping Use  ? Vaping Use: Never used  ?Substance and Sexual Activity  ? Alcohol use: Yes  ?  Alcohol/week: 18.0 standard drinks  ?  Types: 18 Standard drinks or equivalent per week  ?  Comment: 2-3 vodkas daily  ? Drug use: No  ? Sexual activity: Not on file  ?Other Topics Concern  ? Not on file  ?Social History Narrative  ? Not on file  ? ?Social Determinants of Health  ? ?Financial Resource Strain: Not on file  ?Food Insecurity: Not on file  ?Transportation Needs: Not on file  ?Physical Activity: Not on file  ?Stress: Not on file  ?Social Connections: Not on file  ?Intimate  Partner Violence: Not on file  ? ? ?Review of Systems: ?See HPI, otherwise negative ROS ? ?Physical Exam: ?BP (!) 141/84   Pulse 64   Temp 98.2 ?F (36.8 ?C) (Temporal)   Resp 20   Ht '5\' 8"'$  (1.727 m)   Wt 71.7 kg   SpO2 94%   BMI 24.02 kg/m?  ?General:   Alert, cooperative in NAD ?Head:  Normocephalic and atraumatic. ?Respiratory:  Normal work of breathing. ?Cardiovascular:  RRR ? ?Impression/Plan: ?Logan Morgan is here for cataract surgery. ? ?Risks, benefits, limitations, and alternatives regarding cataract surgery have been reviewed with the patient.  Questions have been answered.  All parties agreeable. ? ? ?Benay Pillow, MD  09/15/2021, 11:16 AM ? ? ?

## 2021-09-15 NOTE — Anesthesia Preprocedure Evaluation (Addendum)
Anesthesia Evaluation  ?Patient identified by MRN, date of birth, ID band ?Patient awake ? ? ? ?Reviewed: ?Allergy & Precautions, H&P , NPO status , Patient's Chart, lab work & pertinent test results, reviewed documented beta blocker date and time  ? ?Airway ?Mallampati: II ? ?TM Distance: >3 FB ?Neck ROM: full ? ? ? Dental ?no notable dental hx. ? ?  ?Pulmonary ?COPD, former smoker,  ?  ?Pulmonary exam normal ?breath sounds clear to auscultation ? ? ? ? ? ? Cardiovascular ?Exercise Tolerance: Good ?hypertension, Normal cardiovascular exam+ dysrhythmias Atrial Fibrillation  ?Rhythm:regular Rate:Normal ? ? ?  ?Neuro/Psych ?negative neurological ROS ? negative psych ROS  ? GI/Hepatic ?negative GI ROS, Neg liver ROS,   ?Endo/Other  ?negative endocrine ROS ? Renal/GU ?negative Renal ROS  ?negative genitourinary ?  ?Musculoskeletal ? ? Abdominal ?  ?Peds ? Hematology ?negative hematology ROS ?(+)   ?Anesthesia Other Findings ? ? Reproductive/Obstetrics ?negative OB ROS ? ?  ? ? ? ? ? ? ? ? ? ? ? ? ? ?  ?  ? ? ? ? ? ? ? ?Anesthesia Physical ?Anesthesia Plan ? ?ASA: 2 ? ?Anesthesia Plan: MAC  ? ?Post-op Pain Management:   ? ?Induction:  ? ?PONV Risk Score and Plan:  ? ?Airway Management Planned:  ? ?Additional Equipment:  ? ?Intra-op Plan:  ? ?Post-operative Plan:  ? ?Informed Consent: I have reviewed the patients History and Physical, chart, labs and discussed the procedure including the risks, benefits and alternatives for the proposed anesthesia with the patient or authorized representative who has indicated his/her understanding and acceptance.  ? ? ? ?Dental Advisory Given ? ?Plan Discussed with: CRNA and Anesthesiologist ? ?Anesthesia Plan Comments:   ? ? ? ? ? ? ?Anesthesia Quick Evaluation ? ?

## 2021-09-15 NOTE — Op Note (Signed)
OPERATIVE NOTE ? ?SILVINO SELMAN ?[id number] ?09/15/2021 ? ? ?PREOPERATIVE DIAGNOSIS:  Nuclear sclerotic cataract left eye.  H25.12 ?  ?POSTOPERATIVE DIAGNOSIS:    Nuclear sclerotic cataract left eye.   ?  ?PROCEDURE:  Phacoemusification with posterior chamber intraocular lens placement of the left eye  ? ?LENS:   ?Implant Name Type Inv. Item Serial No. Manufacturer Lot No. LRB No. Used Action  ?LENS IOL TECNIS EYHANCE 22.5 - [id number] Intraocular Lens LENS IOL TECNIS EYHANCE 22.5 [id number] [employer]  Left 1 Implanted  ?    ?Procedure(s): ?CATARACT EXTRACTION PHACO AND INTRAOCULAR LENS PLACEMENT (IOC) LEFT 12.74 01:04.8 (Left) ? ?DIB00 +22.5 ?  ?ULTRASOUND TIME: 1 minutes 04 seconds.  CDE 12.74 ?  ?SURGEON:  Benay Pillow, MD, MPH ?  ?ANESTHESIA:  Topical with tetracaine drops augmented with 1% preservative-free intracameral lidocaine. ? ?ESTIMATED BLOOD LOSS: <1 mL ?  ?COMPLICATIONS:  None. ?  ?DESCRIPTION OF PROCEDURE:  The patient was identified in the holding room and transported to the operating room and placed in the supine position under the operating microscope.  The left eye was identified as the operative eye and it was prepped and draped in the usual sterile ophthalmic fashion. ?  ?A 1.0 millimeter clear-corneal paracentesis was made at the 5:00 position. 0.5 ml of preservative-free 1% lidocaine with epinephrine was injected into the anterior chamber. ? The anterior chamber was filled with Healon 5 viscoelastic.  A 2.4 millimeter keratome was used to make a near-clear corneal incision at the 2:00 position.  A curvilinear capsulorrhexis was made with a cystotome and capsulorrhexis forceps.  Balanced salt solution was used to hydrodissect and hydrodelineate the nucleus. ?  ?Phacoemulsification was then used in stop and chop fashion to remove the lens nucleus and epinucleus.  The remaining cortex was then removed using the irrigation and aspiration handpiece. Healon was then placed into the capsular bag to  distend it for lens placement.  A lens was then injected into the capsular bag.  The remaining viscoelastic was aspirated. ?  ?Wounds were hydrated with balanced salt solution.  The anterior chamber was inflated to a physiologic pressure with balanced salt solution. ? ?Intracameral vigamox 0.1 mL undiltued was injected into the eye and a drop placed onto the ocular surface. ? No wound leaks were noted.  The patient was taken to the recovery room in stable condition without complications of anesthesia or surgery ? ?Benay Pillow ?09/15/2021, 11:42 AM ? ?

## 2021-09-15 NOTE — Anesthesia Postprocedure Evaluation (Signed)
Anesthesia Post Note ? ?Patient: Logan Morgan ? ?Procedure(s) Performed: CATARACT EXTRACTION PHACO AND INTRAOCULAR LENS PLACEMENT (IOC) LEFT 12.74 01:04.8 (Left: Eye) ? ? ?  ?Patient location during evaluation: PACU ?Anesthesia Type: MAC ?Level of consciousness: awake and alert ?Pain management: pain level controlled ?Vital Signs Assessment: post-procedure vital signs reviewed and stable ?Respiratory status: spontaneous breathing, nonlabored ventilation, respiratory function stable and patient connected to nasal cannula oxygen ?Cardiovascular status: stable and blood pressure returned to baseline ?Postop Assessment: no apparent nausea or vomiting ?Anesthetic complications: no ? ? ?No notable events documented. ? ?Trecia Rogers ? ? ? ? ? ?

## 2021-09-15 NOTE — Anesthesia Procedure Notes (Signed)
Procedure Name: [place] ?Date/Time: 09/15/2021 11:24 AM ?Performed by: Mayme Genta, CRNA ?Pre-anesthesia Checklist: Patient identified, Emergency Drugs available, Suction available, Timeout performed and Patient being monitored ?Patient Re-evaluated:Patient Re-evaluated prior to induction ?Oxygen Delivery Method: Nasal cannula ?Placement Confirmation: positive ETCO2 ? ? ? ? ?

## 2021-09-15 NOTE — Transfer of Care (Signed)
Immediate Anesthesia Transfer of Care Note ? ?Patient: Logan Morgan ? ?Procedure(s) Performed: CATARACT EXTRACTION PHACO AND INTRAOCULAR LENS PLACEMENT (IOC) LEFT 12.74 01:04.8 (Left: Eye) ? ?Patient Location: PACU ? ?Anesthesia Type: MAC ? ?Level of Consciousness: awake, alert  and patient cooperative ? ?Airway and Oxygen Therapy: Patient Spontanous Breathing and Patient connected to supplemental oxygen ? ?Post-op Assessment: Post-op Vital signs reviewed, Patient's Cardiovascular Status Stable, Respiratory Function Stable, Patent Airway and No signs of Nausea or vomiting ? ?Post-op Vital Signs: Reviewed and stable ? ?Complications: No notable events documented. ? ?

## 2021-09-16 ENCOUNTER — Encounter: Payer: Self-pay | Admitting: Ophthalmology

## 2021-09-22 ENCOUNTER — Telehealth: Payer: Self-pay

## 2021-09-22 ENCOUNTER — Encounter: Payer: Self-pay | Admitting: Gastroenterology

## 2021-09-22 ENCOUNTER — Ambulatory Visit (INDEPENDENT_AMBULATORY_CARE_PROVIDER_SITE_OTHER): Payer: Medicare HMO | Admitting: Gastroenterology

## 2021-09-22 VITALS — BP 128/71 | HR 72 | Temp 97.9°F | Ht 68.0 in | Wt 155.0 lb

## 2021-09-22 DIAGNOSIS — D500 Iron deficiency anemia secondary to blood loss (chronic): Secondary | ICD-10-CM

## 2021-09-22 MED ORDER — NA SULFATE-K SULFATE-MG SULF 17.5-3.13-1.6 GM/177ML PO SOLN: 1.0000 | Freq: Once | ORAL | 0 refills | Status: AC

## 2021-09-22 NOTE — Progress Notes (Signed)
? ? ?Gastroenterology Consultation ? ?Referring Provider:     Tracie Harrier, MD ?Primary Care Physician:  Tracie Harrier, MD ?Primary Gastroenterologist:  Dr. Allen Norris     ?Reason for Consultation:     Anemia ?      ? HPI:   ?Logan Morgan is a 81 y.o. y/o male referred for consultation & management of anemia by Dr. Tracie Harrier, MD. this patient comes in today with a history of anemia.  The patient had a hemoglobin of 8.6 with hematocrit of 28.5 with a ferritin of 46.  The patient's ferritin 1 year ago was 95.  The patient's MCV is 90.8 without any iron studies recently done.  The patient has been on iron as indicated by his med list.  There is no report of any blood in his stools and he has had Hemoccult negative stools every year.  The patient was on Xarelto and it appears that has been held by his primary care provider. He takes that for a-fib. ?He says that he has been more constipated over that last few years and reports he use to be more regular.  The patient denies any abdominal pain fevers chills nausea or vomiting. ? ?Past Medical History:  ?Diagnosis Date  ? Atrial fibrillation ([place])   ? COPD (chronic obstructive pulmonary disease) ([place])   ? Dyspnea   ? Pt reports due to inactivity  ? Dysrhythmia   ? Emphysema lung ([place])   ? Prostate cancer ([place])   ? ? ?Past Surgical History:  ?Procedure Laterality Date  ? CATARACT EXTRACTION W/PHACO Left 09/15/2021  ? Procedure: CATARACT EXTRACTION PHACO AND INTRAOCULAR LENS PLACEMENT (IOC) LEFT 12.74 01:04.8;  Surgeon: Eulogio Bear, MD;  Location: [facility];  Service: Ophthalmology;  Laterality: Left;  ? COLONOSCOPY    ? HAND SURGERY    ? x3. as child  ? RADIOACTIVE SEED IMPLANT    ? TONSILLECTOMY    ? as child  ? ? ?Prior to Admission medications   ?Medication Sig Start Date End Date Taking? Authorizing Provider  ?albuterol (VENTOLIN HFA) 108 (90 Base) MCG/ACT inhaler Inhale 2 puffs into the lungs every 6 (six) hours as needed for wheezing or  shortness of breath. 08/28/21   Byrum, Rose Fillers, MD  ?amiodarone (PACERONE) 200 MG tablet Take 2 tablets (400 mg total) by mouth 2 (two) times daily. ?Patient taking differently: Take 400 mg by mouth daily. 07/31/16   Epifanio Lesches, MD  ?atorvastatin (LIPITOR) 20 MG tablet Take 20 mg by mouth daily.    [provider]  ?ferrous sulfate 325 (65 FE) MG tablet Take 325 mg by mouth daily.    [provider]  ?furosemide (LASIX) 20 MG tablet Take 20 mg by mouth daily.    [provider]  ?glucosamine-chondroitin 500-400 MG tablet Take 1 tablet by mouth 2 (two) times daily.     [provider]  ?metoprolol succinate (TOPROL-XL) 25 MG 24 hr tablet Take 25 mg by mouth daily.    [provider]  ?Multiple Vitamin (ONE-A-DAY MENS PO) Take 1 tablet by mouth daily.    [provider]  ?olmesartan (BENICAR) 40 MG tablet Take by mouth. 06/07/19   [provider]  ?Omega-3 Fatty Acids (FISH OIL) 1000 MG CAPS Take by mouth daily.    [provider]  ?potassium chloride SA (K-DUR,KLOR-CON) 20 MEQ tablet Take 1 tablet (20 mEq total) by mouth daily. ?Patient taking differently: Take 20 mEq by mouth 2 (two) times daily.  10/26/14   Teodoro Spray, MD  ?pravastatin (PRAVACHOL) 40 MG tablet Take 40 mg by mouth daily.    [provider]  ?rivaroxaban (XARELTO) 20 MG TABS tablet Take 20 mg by mouth daily with supper. ?Patient not taking: Reported on 08/28/2021    [provider]  ?sertraline (ZOLOFT) 100 MG tablet Take 150 mg by mouth daily.    [provider]  ?sertraline (ZOLOFT) 50 MG tablet Take 1 tablet by mouth daily. 08/13/20   [provider]  ?tamsulosin (FLOMAX) 0.4 MG CAPS capsule Take 0.4 mg by mouth daily.    [provider]  ?Donnal Debar 100-62.5-25 MCG/INH AEPB Inhale 1 puff into the lungs daily. 04/06/20   [provider]  ? ? ?Family History  ?Problem Relation Age of Onset  ? COPD Mother   ?   ? ?Social History  ? ?Tobacco Use  ? Smoking status: Former  ?  Years: 38.00  ?  Types: Cigarettes  ?  Quit date: 1998  ?  Years since quitting: 25.3  ? Smokeless tobacco: Never  ?Vaping Use  ? Vaping Use: Never used  ?Substance Use Topics  ? Alcohol use: Yes  ?  Alcohol/week: 18.0 standard drinks  ?  Types: 18 Standard drinks or equivalent per week  ?  Comment: 2-3 vodkas daily  ? Drug use: No  ? ? ?Allergies as of 09/22/2021  ? (No Known Allergies)  ? ? ?Review of Systems:    ?All systems reviewed and negative except where noted in HPI. ? ? Physical Exam:  ?There were no vitals taken for this visit. ?No LMP for male patient. ?General:   Alert,  Well-developed, well-nourished, pleasant and cooperative in NAD ?Head:  Normocephalic and atraumatic. ?Eyes:  Sclera clear, no icterus.   Conjunctiva pink. ?Ears:  Normal auditory acuity. ?Neck:  Supple; no masses or thyromegaly. ?Lungs:  Respirations even and unlabored.  Clear throughout to auscultation.   No wheezes, crackles, or rhonchi. No acute distress. ?Heart:  Regular rate and rhythm; no murmurs, clicks, rubs, or gallops. ?Abdomen:  Normal bowel sounds.  No bruits.  Soft, non-tender and non-distended without masses, hepatosplenomegaly or hernias noted.  No guarding or rebound tenderness.  Negative Carnett sign.   ?Rectal:  Deferred.  ?Pulses:  Normal pulses noted. ?Extremities:  No clubbing or edema.  No cyanosis. ?Neurologic:  Alert and oriented x3;  grossly normal neurologically. ?Skin:  Intact without significant lesions or rashes.  No jaundice. ?Lymph Nodes:  No significant cervical adenopathy. ?Psych:  Alert and cooperative. Normal mood and affect. ? ?Imaging Studies: ?No results found. ? ?Assessment and Plan:  ? ?Logan Morgan is a 81 y.o. y/o male who comes in today with a history of iron deficiency anemia and reports some intermittent rectal bleeding and has been told that he has hemorrhoids.  He has not had any bleeding recently with his constipation but  states when he does have the bleeding it is only on the toilet paper consistent with hemorrhoidal bleeding.  The patient will be set up for an EGD and colonoscopy due to his iron deficiency anemia and he will also need a capsule endoscopy if the upper endoscopy and colonoscopy are negative.  The patient has been explained the plan and agrees with it. ? ? ? ?Lucilla Lame, MD. Marval Regal ? ? ? Note: This dictation was prepared with Dragon dictation along with smaller phrase technology. Any transcriptional errors that result from this process are unintentional.   ?

## 2021-09-22 NOTE — Telephone Encounter (Signed)
Procedure clearance faxed to Dr Linton Ham office  ?

## 2021-09-22 NOTE — Addendum Note (Signed)
Addended by: Lurlean Nanny on: 09/22/2021 01:55 PM ? ? Modules accepted: Orders ? ?

## 2021-09-23 ENCOUNTER — Encounter: Payer: Self-pay | Admitting: Ophthalmology

## 2021-09-25 NOTE — Discharge Instructions (Signed)

## 2021-09-29 ENCOUNTER — Encounter: Payer: Self-pay | Admitting: Ophthalmology

## 2021-09-29 ENCOUNTER — Encounter
Admission: RE | Disposition: A | Discharge status: Home / Self Care | Payer: Self-pay | Source: Home / Self Care | Attending: Ophthalmology

## 2021-09-29 ENCOUNTER — Ambulatory Visit
Admission: RE | Admit: 2021-09-29 | Discharge: 2021-09-29 | Disposition: A | Discharge status: Home / Self Care | Payer: Medicare HMO | Attending: Ophthalmology | Admitting: Ophthalmology

## 2021-09-29 ENCOUNTER — Other Ambulatory Visit: Payer: Self-pay

## 2021-09-29 ENCOUNTER — Ambulatory Visit: Payer: Medicare HMO | Admitting: Anesthesiology

## 2021-09-29 DIAGNOSIS — J439 Emphysema, unspecified: Secondary | ICD-10-CM | POA: Diagnosis not present

## 2021-09-29 DIAGNOSIS — H2511 Age-related nuclear cataract, right eye: Secondary | ICD-10-CM | POA: Diagnosis present

## 2021-09-29 DIAGNOSIS — Z79899 Other long term (current) drug therapy: Secondary | ICD-10-CM | POA: Insufficient documentation

## 2021-09-29 DIAGNOSIS — Z7901 Long term (current) use of anticoagulants: Secondary | ICD-10-CM | POA: Diagnosis not present

## 2021-09-29 DIAGNOSIS — Z87891 Personal history of nicotine dependence: Secondary | ICD-10-CM | POA: Diagnosis not present

## 2021-09-29 DIAGNOSIS — I4891 Unspecified atrial fibrillation: Secondary | ICD-10-CM | POA: Diagnosis not present

## 2021-09-29 DIAGNOSIS — Z8546 Personal history of malignant neoplasm of prostate: Secondary | ICD-10-CM | POA: Insufficient documentation

## 2021-09-29 DIAGNOSIS — Z7951 Long term (current) use of inhaled steroids: Secondary | ICD-10-CM | POA: Insufficient documentation

## 2021-09-29 HISTORY — PX: CATARACT EXTRACTION W/PHACO: SHX586

## 2021-09-29 SURGERY — PHACOEMULSIFICATION, CATARACT, WITH IOL INSERTION
Anesthesia: Monitor Anesthesia Care | Site: Eye | Laterality: Right

## 2021-09-29 MED ORDER — FENTANYL CITRATE (PF) 100 MCG/2ML IJ SOLN
INTRAMUSCULAR | Status: DC | PRN
  Administered 2021-09-29: 50 ug via INTRAVENOUS

## 2021-09-29 MED ORDER — SIGHTPATH DOSE#1 SODIUM HYALURONATE 23 MG/ML IO SOLUTION
PREFILLED_SYRINGE | INTRAOCULAR | Status: DC | PRN
  Administered 2021-09-29: 0.6 mL via INTRAOCULAR

## 2021-09-29 MED ORDER — SIGHTPATH DOSE#1 BSS IO SOLN
INTRAOCULAR | Status: DC | PRN
  Administered 2021-09-29: 15 mL

## 2021-09-29 MED ORDER — ACETAMINOPHEN 325 MG PO TABS: 325.0000 mg | ORAL_TABLET | ORAL | Status: DC | PRN

## 2021-09-29 MED ORDER — ONDANSETRON HCL 4 MG/2ML IJ SOLN: 4.0000 mg | Freq: Once | INTRAMUSCULAR | Status: DC | PRN

## 2021-09-29 MED ORDER — MIDAZOLAM HCL 2 MG/2ML IJ SOLN
INTRAMUSCULAR | Status: DC | PRN
Start: 2021-09-29 — End: 2021-09-29
  Administered 2021-09-29: 2 mg via INTRAVENOUS

## 2021-09-29 MED ORDER — TETRACAINE HCL 0.5 % OP SOLN
1.0000 [drp] | OPHTHALMIC | Status: DC | PRN
  Administered 2021-09-29 (×3): 1 [drp] via OPHTHALMIC

## 2021-09-29 MED ORDER — ARMC OPHTHALMIC DILATING DROPS
1.0000 "application " | OPHTHALMIC | Status: DC | PRN
  Administered 2021-09-29 (×3): 1 via OPHTHALMIC

## 2021-09-29 MED ORDER — SIGHTPATH DOSE#1 BSS IO SOLN
INTRAOCULAR | Status: DC | PRN
  Administered 2021-09-29: 82 mL via OPHTHALMIC

## 2021-09-29 MED ORDER — SIGHTPATH DOSE#1 SODIUM HYALURONATE 10 MG/ML IO SOLUTION
PREFILLED_SYRINGE | INTRAOCULAR | Status: DC | PRN
  Administered 2021-09-29: 0.85 mL via INTRAOCULAR

## 2021-09-29 MED ORDER — MOXIFLOXACIN HCL 0.5 % OP SOLN
OPHTHALMIC | Status: DC | PRN
  Administered 2021-09-29: 0.2 mL via OPHTHALMIC

## 2021-09-29 MED ORDER — ACETAMINOPHEN 160 MG/5ML PO SOLN: 325.0000 mg | ORAL | Status: DC | PRN

## 2021-09-29 MED ORDER — LIDOCAINE HCL (PF) 2 % IJ SOLN
INTRAOCULAR | Status: DC | PRN
  Administered 2021-09-29: 1 mL via INTRAOCULAR

## 2021-09-29 SURGICAL SUPPLY — 14 items
CATARACT SUITE SIGHTPATH (MISCELLANEOUS) ×2 IMPLANT
DISSECTOR HYDRO NUCLEUS 50X22 (MISCELLANEOUS) ×2 IMPLANT
FEE CATARACT SUITE SIGHTPATH (MISCELLANEOUS) ×1 IMPLANT
GLOVE SURG GAMMEX PI TX LF 7.5 (GLOVE) ×2 IMPLANT
GLOVE SURG SYN 8.5  E (GLOVE) ×1
GLOVE SURG SYN 8.5 E (GLOVE) ×1 IMPLANT
GLOVE SURG SYN 8.5 PF PI (GLOVE) ×1 IMPLANT
LENS IOL TECNIS EYHANCE 22.5 (Intraocular Lens) ×1 IMPLANT
NDL FILTER BLUNT 18X1 1/2 (NEEDLE) ×1 IMPLANT
NEEDLE FILTER BLUNT 18X 1/2SAF (NEEDLE) ×1
NEEDLE FILTER BLUNT 18X1 1/2 (NEEDLE) ×1 IMPLANT
SYR 3ML LL SCALE MARK (SYRINGE) ×2 IMPLANT
SYR 5ML LL (SYRINGE) ×2 IMPLANT
WATER STERILE IRR 250ML POUR (IV SOLUTION) ×2 IMPLANT

## 2021-09-29 NOTE — Anesthesia Postprocedure Evaluation (Signed)
Anesthesia Post Note ? ?Patient: Logan Morgan ? ?Procedure(s) Performed: CATARACT EXTRACTION PHACO AND INTRAOCULAR LENS PLACEMENT (IOC) RIGHT (Right: Eye) ? ? ?  ?Patient location during evaluation: PACU ?Anesthesia Type: MAC ?Level of consciousness: awake ?Pain management: pain level controlled ?Vital Signs Assessment: post-procedure vital signs reviewed and stable ?Respiratory status: respiratory function stable ?Cardiovascular status: stable ?Postop Assessment: no apparent nausea or vomiting ?Anesthetic complications: no ? ? ?No notable events documented. ? ?Veda Canning ? ? ? ? ? ?

## 2021-09-29 NOTE — Op Note (Signed)
OPERATIVE NOTE ? ?Logan Morgan ?[id number] ?09/29/2021 ? ? ?PREOPERATIVE DIAGNOSIS:  Nuclear sclerotic cataract right eye.  H25.11 ?  ?POSTOPERATIVE DIAGNOSIS:    Nuclear sclerotic cataract right eye.   ?  ?PROCEDURE:  Phacoemusification with posterior chamber intraocular lens placement of the right eye  ? ?LENS:   ?Implant Name Type Inv. Item Serial No. Manufacturer Lot No. LRB No. Used Action  ?LENS IOL TECNIS EYHANCE 22.5 - [id number] Intraocular Lens LENS IOL TECNIS EYHANCE 22.5 [id number] [employer]  Right 1 Implanted  ?    ? ?Procedure(s) with comments: ?CATARACT EXTRACTION PHACO AND INTRAOCULAR LENS PLACEMENT (IOC) RIGHT (Right) - 14.81 ?01:15.8 ? ?DIB00 +22.5 ?  ?ULTRASOUND TIME: 1 minutes 15 seconds.  CDE 14.81 ?  ?SURGEON:  Benay Pillow, MD, MPH ? ?ANESTHESIOLOGIST: Anesthesiologist: Veda Canning, MD ?CRNA: Jeannene Patella, CRNA ?  ?ANESTHESIA:  Topical with tetracaine drops augmented with 1% preservative-free intracameral lidocaine. ? ?ESTIMATED BLOOD LOSS: less than 1 mL. ?  ?COMPLICATIONS:  None. ?  ?DESCRIPTION OF PROCEDURE:  The patient was identified in the holding room and transported to the operating room and placed in the supine position under the operating microscope.  The right eye was identified as the operative eye and it was prepped and draped in the usual sterile ophthalmic fashion. ?  ?A 1.0 millimeter clear-corneal paracentesis was made at the 10:30 position. 0.5 ml of preservative-free 1% lidocaine with epinephrine was injected into the anterior chamber. ? The anterior chamber was filled with Healon 5 viscoelastic.  A 2.4 millimeter keratome was used to make a near-clear corneal incision at the 8:00 position.  A curvilinear capsulorrhexis was made with a cystotome and capsulorrhexis forceps.  Balanced salt solution was used to hydrodissect and hydrodelineate the nucleus. ?  ?Phacoemulsification was then used in stop and chop fashion to remove the lens nucleus and epinucleus.  The  remaining cortex was then removed using the irrigation and aspiration handpiece. Healon was then placed into the capsular bag to distend it for lens placement.  A lens was then injected into the capsular bag.  The remaining viscoelastic was aspirated. ?  ?Wounds were hydrated with balanced salt solution.  The anterior chamber was inflated to a physiologic pressure with balanced salt solution.  ? ?Intracameral vigamox 0.1 mL undiluted was injected into the eye and a drop placed onto the ocular surface. ? ?No wound leaks were noted.  The patient was taken to the recovery room in stable condition without complications of anesthesia or surgery ? ?Benay Pillow ?09/29/2021, 9:33 AM ? ?

## 2021-09-29 NOTE — Transfer of Care (Signed)
Immediate Anesthesia Transfer of Care Note ? ?Patient: Logan Morgan ? ?Procedure(s) Performed: CATARACT EXTRACTION PHACO AND INTRAOCULAR LENS PLACEMENT (IOC) RIGHT (Right: Eye) ? ?Patient Location: PACU ? ?Anesthesia Type: MAC ? ?Level of Consciousness: awake, alert  and patient cooperative ? ?Airway and Oxygen Therapy: Patient Spontanous Breathing and Patient connected to supplemental oxygen ? ?Post-op Assessment: Post-op Vital signs reviewed, Patient's Cardiovascular Status Stable, Respiratory Function Stable, Patent Airway and No signs of Nausea or vomiting ? ?Post-op Vital Signs: Reviewed and stable ? ?Complications: No notable events documented. ? ?

## 2021-09-29 NOTE — H&P (Signed)
[facility]  ? ?Primary Care Physician:  Tracie Harrier, MD ?Ophthalmologist: Dr. Benay Pillow ? ?Pre-Procedure History & Physical: ?HPI:  Logan Morgan is a 81 y.o. male here for cataract surgery. ?  ?Past Medical History:  ?Diagnosis Date  ? Atrial fibrillation ([place])   ? COPD (chronic obstructive pulmonary disease) ([place])   ? Dyspnea   ? Pt reports due to inactivity  ? Dysrhythmia   ? Emphysema lung ([place])   ? Prostate cancer ([place])   ? ? ?Past Surgical History:  ?Procedure Laterality Date  ? CATARACT EXTRACTION W/PHACO Left 09/15/2021  ? Procedure: CATARACT EXTRACTION PHACO AND INTRAOCULAR LENS PLACEMENT (IOC) LEFT 12.74 01:04.8;  Surgeon: Eulogio Bear, MD;  Location: [facility];  Service: Ophthalmology;  Laterality: Left;  ? COLONOSCOPY    ? HAND SURGERY    ? x3. as child  ? RADIOACTIVE SEED IMPLANT    ? TONSILLECTOMY    ? as child  ? ? ?Prior to Admission medications   ?Medication Sig Start Date End Date Taking? Authorizing Provider  ?albuterol (VENTOLIN HFA) 108 (90 Base) MCG/ACT inhaler Inhale 2 puffs into the lungs every 6 (six) hours as needed for wheezing or shortness of breath. 08/28/21  Yes Byrum, Rose Fillers, MD  ?amiodarone (PACERONE) 200 MG tablet Take 2 tablets (400 mg total) by mouth 2 (two) times daily. ?Patient taking differently: Take 400 mg by mouth daily. 07/31/16  Yes Epifanio Lesches, MD  ?atorvastatin (LIPITOR) 20 MG tablet Take 20 mg by mouth daily.   Yes [provider]  ?ferrous sulfate 325 (65 FE) MG tablet Take 325 mg by mouth daily.   Yes [provider]  ?furosemide (LASIX) 20 MG tablet Take 20 mg by mouth daily.   Yes [provider]  ?glucosamine-chondroitin 500-400 MG tablet Take 1 tablet by mouth 2 (two) times daily.    Yes [provider]  ?metoprolol succinate (TOPROL-XL) 25 MG 24 hr tablet Take 25 mg by mouth daily.   Yes [provider]  ?Multiple Vitamin (ONE-A-DAY MENS PO) Take 1 tablet by mouth daily.   Yes  [provider]  ?olmesartan (BENICAR) 40 MG tablet Take by mouth. 06/07/19  Yes [provider]  ?Omega-3 Fatty Acids (FISH OIL) 1000 MG CAPS Take by mouth daily.   Yes [provider]  ?potassium chloride SA (K-DUR,KLOR-CON) 20 MEQ tablet Take 1 tablet (20 mEq total) by mouth daily. ?Patient taking differently: Take 20 mEq by mouth 2 (two) times daily. 10/26/14  Yes Teodoro Spray, MD  ?pravastatin (PRAVACHOL) 40 MG tablet Take 40 mg by mouth daily.   Yes [provider]  ?rivaroxaban (XARELTO) 20 MG TABS tablet Take 20 mg by mouth daily with supper.   Yes [provider]  ?sertraline (ZOLOFT) 100 MG tablet Take 150 mg by mouth daily.   Yes [provider]  ?sertraline (ZOLOFT) 50 MG tablet Take 1 tablet by mouth daily. 08/13/20  Yes [provider]  ?tamsulosin (FLOMAX) 0.4 MG CAPS capsule Take 0.4 mg by mouth daily.   Yes [provider]  ?TRELEGY ELLIPTA 100-62.5-25 MCG/INH AEPB Inhale 1 puff into the lungs daily. 04/06/20  Yes [provider]  ? ? ?Allergies as of 08/19/2021  ? (No Known Allergies)  ? ? ?Family History  ?Problem Relation Age of Onset  ? COPD Mother   ? ? ?Social History  ? ?Socioeconomic History  ? Marital status: Married  ?  Spouse name: Not on file  ?  Number of children: Not on file  ? Years of education: Not on file  ? Highest education level: Not on file  ?Occupational History  ? Not on file  ?Tobacco Use  ? Smoking status: Former  ?  Years: 38.00  ?  Types: Cigarettes  ?  Quit date: 1998  ?  Years since quitting: 25.3  ? Smokeless tobacco: Never  ?Vaping Use  ? Vaping Use: Never used  ?Substance and Sexual Activity  ? Alcohol use: Yes  ?  Alcohol/week: 18.0 standard drinks  ?  Types: 18 Standard drinks or equivalent per week  ?  Comment: 2-3 vodkas daily  ? Drug use: No  ? Sexual activity: Not on file  ?Other Topics Concern  ? Not on file  ?Social History Narrative  ? Not on file  ? ?Social Determinants of  Health  ? ?Financial Resource Strain: Not on file  ?Food Insecurity: Not on file  ?Transportation Needs: Not on file  ?Physical Activity: Not on file  ?Stress: Not on file  ?Social Connections: Not on file  ?Intimate Partner Violence: Not on file  ? ? ?Review of Systems: ?See HPI, otherwise negative ROS ? ?Physical Exam: ?BP 123/77   Pulse 61   Temp 98.1 ?F (36.7 ?C) (Temporal)   Ht '5\' 8"'$  (1.727 m)   Wt 71.1 kg   SpO2 94%   BMI 23.83 kg/m?  ?General:   Alert, cooperative in NAD ?Head:  Normocephalic and atraumatic. ?Respiratory:  Normal work of breathing. ?Cardiovascular:  RRR ? ?Impression/Plan: ?Logan Morgan is here for cataract surgery. ? ?Risks, benefits, limitations, and alternatives regarding cataract surgery have been reviewed with the patient.  Questions have been answered.  All parties agreeable. ? ? ?Benay Pillow, MD  09/29/2021, 9:05 AM ? ? ?

## 2021-09-29 NOTE — Anesthesia Procedure Notes (Signed)
Procedure Name: [place] ?Date/Time: 09/29/2021 9:16 AM ?Performed by: Jeannene Patella, CRNA ?Pre-anesthesia Checklist: Patient identified, Emergency Drugs available, Suction available, Timeout performed and Patient being monitored ?Patient Re-evaluated:Patient Re-evaluated prior to induction ?Oxygen Delivery Method: Nasal cannula ?Placement Confirmation: positive ETCO2 ? ? ? ? ?

## 2021-09-29 NOTE — Anesthesia Preprocedure Evaluation (Signed)
Anesthesia Evaluation  ?Patient identified by MRN, date of birth, ID band ?Patient awake ? ? ? ?Reviewed: ?Allergy & Precautions, H&P , NPO status  ? ?Airway ?Mallampati: II ? ?TM Distance: >3 FB ?Neck ROM: full ? ? ? Dental ?no notable dental hx. ? ?  ?Pulmonary ?COPD, former smoker,  ?  ?Pulmonary exam normal ?breath sounds clear to auscultation ? ? ? ? ? ? Cardiovascular ?Exercise Tolerance: Good ?hypertension, Normal cardiovascular exam+ dysrhythmias Atrial Fibrillation  ?Rhythm:regular Rate:Normal ? ? ?  ?Neuro/Psych ?  ? GI/Hepatic ?negative GI ROS,   ?Endo/Other  ? ? Renal/GU ?  ? ?  ?Musculoskeletal ? ? Abdominal ?  ?Peds ? Hematology ?  ?Anesthesia Other Findings ? ? Reproductive/Obstetrics ? ?  ? ? ? ? ? ? ? ? ? ? ? ? ? ?  ?  ? ? ? ? ? ? ? ? ?Anesthesia Physical ? ?Anesthesia Plan ? ?ASA: 2 ? ?Anesthesia Plan: MAC  ? ?Post-op Pain Management:   ? ?Induction:  ? ?PONV Risk Score and Plan: 1 and TIVA, Midazolam and Treatment may vary due to age or medical condition ? ?Airway Management Planned: Nasal Cannula ? ?Additional Equipment:  ? ?Intra-op Plan:  ? ?Post-operative Plan:  ? ?Informed Consent: I have reviewed the patients History and Physical, chart, labs and discussed the procedure including the risks, benefits and alternatives for the proposed anesthesia with the patient or authorized representative who has indicated his/her understanding and acceptance.  ? ? ? ?Dental Advisory Given ? ?Plan Discussed with: CRNA and Anesthesiologist ? ?Anesthesia Plan Comments:   ? ? ? ? ? ? ?Anesthesia Quick Evaluation ? ?

## 2021-09-30 ENCOUNTER — Encounter: Payer: Self-pay | Admitting: Ophthalmology

## 2021-09-30 NOTE — Telephone Encounter (Signed)
Procedure clearance received and sent to be scanned...  stated that he was advised to hold Xarelto until after procedure  ?

## 2021-10-23 ENCOUNTER — Encounter: Payer: Self-pay | Admitting: Emergency Medicine

## 2021-10-23 ENCOUNTER — Ambulatory Visit (INDEPENDENT_AMBULATORY_CARE_PROVIDER_SITE_OTHER): Payer: Medicare HMO | Admitting: Emergency Medicine

## 2021-10-23 DIAGNOSIS — J439 Emphysema, unspecified: Secondary | ICD-10-CM

## 2021-10-23 DIAGNOSIS — R053 Chronic cough: Secondary | ICD-10-CM | POA: Diagnosis not present

## 2021-10-23 LAB — PULMONARY FUNCTION TEST
DL/VA % pred: 51 %
DL/VA: 2.02 ml/min/mmHg/L
DLCO cor % pred: 47 %
DLCO cor: 10.93 ml/min/mmHg
DLCO unc % pred: 42 %
DLCO unc: 9.76 ml/min/mmHg
FEF 25-75 Post: 0.68 L/sec
FEF 25-75 Pre: 0.53 L/sec
FEF2575-%Change-Post: 27 %
FEF2575-%Pred-Post: 38 %
FEF2575-%Pred-Pre: 30 %
FEV1-%Change-Post: 9 %
FEV1-%Pred-Post: 58 %
FEV1-%Pred-Pre: 53 %
FEV1-Post: 1.52 L
FEV1-Pre: 1.38 L
FEV1FVC-%Change-Post: 2 %
FEV1FVC-%Pred-Pre: 68 %
FEV6-%Change-Post: 7 %
FEV6-%Pred-Post: 85 %
FEV6-%Pred-Pre: 79 %
FEV6-Post: 2.9 L
FEV6-Pre: 2.69 L
FEV6FVC-%Change-Post: 0 %
FEV6FVC-%Pred-Post: 103 %
FEV6FVC-%Pred-Pre: 102 %
FVC-%Change-Post: 6 %
FVC-%Pred-Post: 82 %
FVC-%Pred-Pre: 77 %
FVC-Post: 3.03 L
FVC-Pre: 2.83 L
Post FEV1/FVC ratio: 50 %
Post FEV6/FVC ratio: 96 %
Pre FEV1/FVC ratio: 49 %
Pre FEV6/FVC Ratio: 95 %
RV % pred: 108 %
RV: 2.79 L
TLC % pred: 96 %
TLC: 6.45 L

## 2021-10-23 MED ORDER — STIOLTO RESPIMAT 2.5-2.5 MCG/ACT IN AERS: 2.0000 | INHALATION_SPRAY | Freq: Every day | RESPIRATORY_TRACT | 0 refills | Status: DC

## 2021-10-23 NOTE — Patient Instructions (Signed)
Performed Full PFT Today.   ?

## 2021-10-23 NOTE — Patient Instructions (Signed)
We reviewed your pulmonary function testing today.  This shows significant obstructive lung disease consistent with COPD. We will try changing your Trelegy to [facility].  Stop the Trelegy for now.  Take Stiolto 2 puffs once daily.  Keep track of whether this medication benefits your breathing.  If so we will send a prescription to your pharmacy. Okay to keep your albuterol available to use 2 puffs when needed for shortness of breath, chest tightness, wheezing. Agree with trying to get back to your exercise routine. Follow with Dr Lamonte Sakai in 6 months or sooner if you have any problems

## 2021-10-23 NOTE — Assessment & Plan Note (Signed)
Severe COPD confirmed on his pulmonary function testing.  He is unsure how much Trelegy really helps him.  He does not have flares, bronchitic symptoms.  I think we can change him to [facility] and stop the ICS for now.  If he benefits we will continue it.  We reviewed your pulmonary function testing today.  This shows significant obstructive lung disease consistent with COPD. We will try changing your Trelegy to [facility].  Stop the Trelegy for now.  Take Stiolto 2 puffs once daily.  Keep track of whether this medication benefits your breathing.  If so we will send a prescription to your pharmacy. Okay to keep your albuterol available to use 2 puffs when needed for shortness of breath, chest tightness, wheezing. Agree with trying to get back to your exercise routine. Follow with Dr Lamonte Sakai in 6 months or sooner if you have any problems

## 2021-10-23 NOTE — Addendum Note (Signed)
Addended by: Gavin Potters R on: 10/23/2021 12:27 PM   Modules accepted: Orders

## 2021-10-23 NOTE — Progress Notes (Signed)
Performed Full PFT Today.   ?

## 2021-10-23 NOTE — Progress Notes (Signed)
Subjective:    Patient ID: Logan Morgan, male    DOB: January 08, 1941, 81 y.o.   MRN: [id number]  HPI 81 y.o. 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  He is referred today for evaluation of shortness of breath, COPD.  He is on Trelegy, unsure whether he benefits much.  Does not have albuterol SOB with bending over, has exertional SOB when he walks less than 50 ft. SOB when walking through his home. SOB w household chores. Can intermittently have some cough, started to be more regular since [place] in 12/22. Some phlegm - yellowish. Hears some wheeze.   Daily Nasal congestion, drainage, clear.   CT chest 10/15/2020 reviewed by me showed marked emphysematous changes especially apically, no nodules or infiltrates.  Most recent pulmonary function testing 02/2019 (Care Everywhere): Severe obstruction with a positive bronchodilator response.  Normal lung volumes (question pseudonormalization).  Decreased diffusion capacity that does not correct when adjusted for alveolar volume   ROV 10/23/21 --follow-up visit for 81 year old gentleman with a history of tobacco use and associated emphysematous COPD.  I saw him in March for dyspnea, intermittent cough, COPD.  The chronic cough have been present since he had COVID-19 in 05/2021.  We did not make any intervention on this as it did not seem to be bothering him significantly.  He had been managed on Trelegy which we continued at least for now.  Walking oximetry did not show any exertional desaturation.  He underwent pulmonary function testing today. He tells me today that he has been doing about the same, he gets some SOB with carrying something up the stairs. Also happens w groceries, housework. He uses albuterol occasionally, not every day.   Pulmonary function testing performed today showed severe obstruction with an FEV1 of 1.38 L (53% predicted) without a bronchodilator response.  Lung  volumes are normal (question pseudonormalization).  His diffusion capacity is decreased and does not correct when adjusted for alveolar volume.  His flow-volume loop shows severe obstruction.   Review of Systems As per HPI  Past Medical History:  Diagnosis Date   Atrial fibrillation (HCC)    COPD (chronic obstructive pulmonary disease) (HCC)    Dyspnea    Pt reports due to inactivity   Dysrhythmia    Emphysema lung ([place])    Prostate cancer ([place])      Family History  Problem Relation Age of Onset   COPD Mother      Social History   Socioeconomic History   Marital status: Married    Spouse name: Not on file   Number of children: Not on file   Years of education: Not on file   Highest education level: Not on file  Occupational History   Not on file  Tobacco Use   Smoking status: Former    Years: 38.00    Types: Cigarettes    Quit date: 1998    Years since quitting: 25.3   Smokeless tobacco: Never  Vaping Use   Vaping Use: Never used  Substance and Sexual Activity   Alcohol use: Yes    Alcohol/week: 18.0 standard drinks    Types: 18 Standard drinks or equivalent per week    Comment: 2-3 vodkas daily   Drug use: No   Sexual activity: Not on file  Other Topics Concern   Not on file  Social History Narrative   Not on file   Social Determinants of Health  Financial Resource Strain: Not on file  Food Insecurity: Not on file  Transportation Needs: Not on file  Physical Activity: Not on file  Stress: Not on file  Social Connections: Not on file  Intimate Partner Violence: Not on file   Has lived in [place], [place], [place],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 8 g 6   amiodarone (PACERONE) 200 MG tablet Take 2 tablets (400 mg total)  by mouth 2 (two) times daily. (Patient taking differently: Take 400 mg by mouth daily.) 60 tablet 0   atorvastatin (LIPITOR) 20 MG tablet Take 20 mg by mouth daily.     ferrous sulfate 325 (65 FE) MG tablet Take 325 mg by mouth daily.     furosemide (LASIX) 20 MG tablet Take 20 mg by mouth daily.     glucosamine-chondroitin 500-400 MG tablet Take 1 tablet by mouth 2 (two) times daily.      metoprolol succinate (TOPROL-XL) 25 MG 24 hr tablet Take 25 mg by mouth daily.     Multiple Vitamin (ONE-A-DAY MENS PO) Take 1 tablet by mouth daily.     olmesartan (BENICAR) 40 MG tablet Take by mouth.     Omega-3 Fatty Acids (FISH OIL) 1000 MG CAPS Take by mouth daily.     potassium chloride SA (K-DUR,KLOR-CON) 20 MEQ tablet Take 1 tablet (20 mEq total) by mouth daily. (Patient taking differently: Take 20 mEq by mouth 2 (two) times daily.) 30 tablet 6   pravastatin (PRAVACHOL) 40 MG tablet Take 40 mg by mouth daily.     rivaroxaban (XARELTO) 20 MG TABS tablet Take 20 mg by mouth daily with supper.     sertraline (ZOLOFT) 100 MG tablet Take 150 mg by mouth daily.     sertraline (ZOLOFT) 50 MG tablet Take 1 tablet by mouth daily.     tamsulosin (FLOMAX) 0.4 MG CAPS capsule Take 0.4 mg by mouth daily.     TRELEGY ELLIPTA 100-62.5-25 MCG/INH AEPB Inhale 1 puff into the lungs daily.     No facility-administered medications prior to visit.         Objective:   Physical Exam  Vitals:   10/23/21 1105  BP: 132/76  Pulse: 70  Temp: 97.7 F (36.5 C)  TempSrc: Oral  SpO2: 94%  Weight: 156 lb 6.4 oz (70.9 kg)  Height: '5\' 8"'$  (1.727 m)    Gen: Pleasant, well-nourished, in no distress,  normal affect  ENT: No lesions,  mouth clear,  oropharynx clear, no postnasal drip  Neck: No JVD, no stridor  Lungs: No use of accessory muscles, distant, no wheeze on a normal breath, he does wheeze bilaterally on forced expiration  Cardiovascular: RRR, heart sounds normal, no murmur or gallops, no peripheral  edema  Musculoskeletal: No deformities, no cyanosis or clubbing  Neuro: alert, awake, non focal  Skin: Warm, no lesions or rash    Assessment & Plan:   Chronic obstructive pulmonary emphysema Severe COPD confirmed on his pulmonary function testing.  He is unsure how much Trelegy really helps him.  He does not have flares, bronchitic symptoms.  I think we can change him to [facility] and stop the ICS for now.  If he benefits we will continue it.  We reviewed your  pulmonary function testing today.  This shows significant obstructive lung disease consistent with COPD. We will try changing your Trelegy to [facility].  Stop the Trelegy for now.  Take Stiolto 2 puffs once daily.  Keep track of whether this medication benefits your breathing.  If so we will send a prescription to your pharmacy. Okay to keep your albuterol available to use 2 puffs when needed for shortness of breath, chest tightness, wheezing. Agree with trying to get back to your exercise routine. Follow with Dr Lamonte Sakai in 6 months or sooner if you have any problems   Baltazar Apo, MD, PhD 10/23/2021, 11:24 AM [facility] [phone] or if no answer before 7:00PM call [phone] For any issues after 7:00PM please call eLink [phone]

## 2021-10-28 ENCOUNTER — Ambulatory Visit: Payer: Medicare HMO | Admitting: Certified Registered Nurse Anesthetist

## 2021-10-28 ENCOUNTER — Encounter
Admission: RE | Disposition: A | Discharge status: Home / Self Care | Payer: Self-pay | Source: Home / Self Care | Attending: Gastroenterology

## 2021-10-28 ENCOUNTER — Ambulatory Visit
Admission: RE | Admit: 2021-10-28 | Discharge: 2021-10-28 | Disposition: A | Discharge status: Home / Self Care | Payer: Medicare HMO | Attending: Gastroenterology | Admitting: Gastroenterology

## 2021-10-28 ENCOUNTER — Encounter: Payer: Self-pay | Admitting: Gastroenterology

## 2021-10-28 DIAGNOSIS — K635 Polyp of colon: Secondary | ICD-10-CM

## 2021-10-28 DIAGNOSIS — D123 Benign neoplasm of transverse colon: Secondary | ICD-10-CM | POA: Insufficient documentation

## 2021-10-28 DIAGNOSIS — J449 Chronic obstructive pulmonary disease, unspecified: Secondary | ICD-10-CM | POA: Diagnosis not present

## 2021-10-28 DIAGNOSIS — K5730 Diverticulosis of large intestine without perforation or abscess without bleeding: Secondary | ICD-10-CM | POA: Diagnosis not present

## 2021-10-28 DIAGNOSIS — I4891 Unspecified atrial fibrillation: Secondary | ICD-10-CM | POA: Insufficient documentation

## 2021-10-28 DIAGNOSIS — K648 Other hemorrhoids: Secondary | ICD-10-CM | POA: Insufficient documentation

## 2021-10-28 DIAGNOSIS — Z87891 Personal history of nicotine dependence: Secondary | ICD-10-CM | POA: Diagnosis not present

## 2021-10-28 DIAGNOSIS — D500 Iron deficiency anemia secondary to blood loss (chronic): Secondary | ICD-10-CM | POA: Diagnosis present

## 2021-10-28 HISTORY — PX: COLONOSCOPY WITH PROPOFOL: SHX5780

## 2021-10-28 HISTORY — PX: ESOPHAGOGASTRODUODENOSCOPY: SHX5428

## 2021-10-28 SURGERY — COLONOSCOPY WITH PROPOFOL
Anesthesia: General

## 2021-10-28 MED ORDER — PHENYLEPHRINE 80 MCG/ML (10ML) SYRINGE FOR IV PUSH (FOR BLOOD PRESSURE SUPPORT)
PREFILLED_SYRINGE | INTRAVENOUS | Status: DC | PRN
  Administered 2021-10-28: 80 ug via INTRAVENOUS

## 2021-10-28 MED ORDER — LIDOCAINE HCL (CARDIAC) PF 100 MG/5ML IV SOSY
PREFILLED_SYRINGE | INTRAVENOUS | Status: DC | PRN
  Administered 2021-10-28: 100 mg via INTRAVENOUS

## 2021-10-28 MED ORDER — PROPOFOL 10 MG/ML IV BOLUS
INTRAVENOUS | Status: DC | PRN
  Administered 2021-10-28: 70 mg via INTRAVENOUS

## 2021-10-28 MED ORDER — SODIUM CHLORIDE 0.9 % IV SOLN
INTRAVENOUS | Status: DC
  Administered 2021-10-28: 1000 mL via INTRAVENOUS

## 2021-10-28 MED ORDER — PROPOFOL 500 MG/50ML IV EMUL
INTRAVENOUS | Status: DC | PRN
  Administered 2021-10-28: 150 ug/kg/min via INTRAVENOUS

## 2021-10-28 NOTE — Anesthesia Postprocedure Evaluation (Signed)
Anesthesia Post Note  Patient: Logan Morgan  Procedure(s) Performed: COLONOSCOPY WITH PROPOFOL ESOPHAGOGASTRODUODENOSCOPY (EGD)  Patient location during evaluation: Endoscopy Anesthesia Type: General Level of consciousness: awake and alert Pain management: pain level controlled Vital Signs Assessment: post-procedure vital signs reviewed and stable Respiratory status: spontaneous breathing, nonlabored ventilation, respiratory function stable and patient connected to nasal cannula oxygen Cardiovascular status: blood pressure returned to baseline and stable Postop Assessment: no apparent nausea or vomiting Anesthetic complications: no   No notable events documented.   Last Vitals:  Vitals:   10/28/21 0959 10/28/21 1009  BP: 121/88 138/89  Pulse: 79 72  Resp: 16 (!) 26  Temp:    SpO2: 95% 95%    Last Pain:  Vitals:   10/28/21 1009  TempSrc:   PainSc: 0-No pain                 Precious Haws Flora Ratz

## 2021-10-28 NOTE — Transfer of Care (Signed)
Immediate Anesthesia Transfer of Care Note  Patient: Logan Morgan  Procedure(s) Performed: COLONOSCOPY WITH PROPOFOL ESOPHAGOGASTRODUODENOSCOPY (EGD)  Patient Location: Endoscopy Unit  Anesthesia Type:General  Level of Consciousness: drowsy  Airway & Oxygen Therapy: Patient Spontanous Breathing  Post-op Assessment: Report given to RN and Post -op Vital signs reviewed and stable  Post vital signs: Reviewed and stable  Last Vitals:  Vitals Value Taken Time  BP 96/67   Temp    Pulse 76 10/28/21 0949  Resp 15 10/28/21 0949  SpO2 100 % 10/28/21 0949  Vitals shown include unvalidated device data.  Last Pain:  Vitals:   10/28/21 0906  TempSrc: Temporal  PainSc: 0-No pain         Complications: No notable events documented.

## 2021-10-28 NOTE — Op Note (Signed)
[facility] Patient Name: Logan Morgan Procedure Date: 10/28/2021 9:12 AM MRN: [id number] Account #: [id number] Date of Birth: [birth year] Admit Type: Outpatient Age: 81 Room: [facility] ENDO ROOM 4 Gender: Male Note Status: Finalized Instrument Name: Upper Endoscope [id number] Procedure:             Upper GI endoscopy Indications:           Iron deficiency anemia Providers:             Lucilla Lame MD, MD Referring MD:          Tracie Harrier, MD (Referring MD) Medicines:             Propofol per Anesthesia Complications:         No immediate complications. Procedure:             Pre-Anesthesia Assessment:                        - Prior to the procedure, a History and Physical was                         performed, and patient medications and allergies were                         reviewed. The patient's tolerance of previous                         anesthesia was also reviewed. The risks and benefits                         of the procedure and the sedation options and risks                         were discussed with the patient. All questions were                         answered, and informed consent was obtained. Prior                         Anticoagulants: The patient has taken no previous                         anticoagulant or antiplatelet agents. ASA Grade                         Assessment: II - A patient with mild systemic disease.                         After reviewing the risks and benefits, the patient                         was deemed in satisfactory condition to undergo the                         procedure.                        After obtaining informed consent, the endoscope was  passed under direct vision. Throughout the procedure,                         the patient's blood pressure, pulse, and oxygen                         saturations were monitored continuously. The Endoscope                         was  introduced through the mouth, and advanced to the                         second part of duodenum. The upper GI endoscopy was                         accomplished without difficulty. The patient tolerated                         the procedure well. Findings:      The examined esophagus was normal.      The stomach was normal.      The examined duodenum was normal. Impression:            - Normal esophagus.                        - Normal stomach.                        - Normal examined duodenum.                        - No specimens collected. Recommendation:        - Discharge patient to home.                        - Resume previous diet.                        - Continue present medications.                        - Perform a colonoscopy. Procedure Code(s):     --- Professional ---                        [phone], Esophagogastroduodenoscopy, flexible,                         transoral; diagnostic, including collection of                         specimen(s) by brushing or washing, when performed                         (separate procedure) Diagnosis Code(s):     --- Professional ---                        D50.9, Iron deficiency anemia, unspecified CPT copyright 2019 American Medical Association. All rights reserved. The codes documented in this report are preliminary and upon coder review may  be revised to meet current compliance requirements. Sravya Grissom  Nehan Flaum MD, MD 10/28/2021 9:35:35 AM This report has been signed electronically. Number of Addenda: 0 Note Initiated On: 10/28/2021 9:12 AM Estimated Blood Loss:  Estimated blood loss: none.      [facility]

## 2021-10-28 NOTE — Anesthesia Preprocedure Evaluation (Signed)
Anesthesia Evaluation  Patient identified by MRN, date of birth, ID band Patient awake    Reviewed: Allergy & Precautions, NPO status , Patient's Chart, lab work & pertinent test results  History of Anesthesia Complications Negative for: history of anesthetic complications  Airway Mallampati: III  TM Distance: >3 FB Neck ROM: full    Dental  (+) Chipped, Poor Dentition, Missing   Pulmonary shortness of breath and with exertion, COPD, former smoker,    Pulmonary exam normal        Cardiovascular Exercise Tolerance: Good hypertension, Normal cardiovascular exam+ dysrhythmias      Neuro/Psych negative neurological ROS  negative psych ROS   GI/Hepatic negative GI ROS, Neg liver ROS, neg GERD  ,  Endo/Other  negative endocrine ROS  Renal/GU negative Renal ROS  negative genitourinary   Musculoskeletal   Abdominal   Peds  Hematology negative hematology ROS (+)   Anesthesia Other Findings Past Medical History: No date: Atrial fibrillation (HCC) No date: COPD (chronic obstructive pulmonary disease) (HCC) No date: Dyspnea     Comment:  Pt reports due to inactivity No date: Dysrhythmia No date: Emphysema lung (HCC) No date: Prostate cancer [facility])  Past Surgical History: 09/15/2021: CATARACT EXTRACTION W/PHACO; Left     Comment:  Procedure: CATARACT EXTRACTION PHACO AND INTRAOCULAR               LENS PLACEMENT (IOC) LEFT 12.74 01:04.8;  Surgeon: Eulogio Bear, MD;  Location: [place];                Service: Ophthalmology;  Laterality: Left; 09/29/2021: CATARACT EXTRACTION W/PHACO; Right     Comment:  Procedure: CATARACT EXTRACTION PHACO AND INTRAOCULAR               LENS PLACEMENT (IOC) RIGHT;  Surgeon: Eulogio Bear,              MD;  Location: [place];  Service:               Ophthalmology;  Laterality: Right;  14.81 01:15.8 No date: COLONOSCOPY No date: EYE  SURGERY No date: HAND SURGERY     Comment:  x3. as child No date: RADIOACTIVE SEED IMPLANT No date: TONSILLECTOMY     Comment:  as child  BMI    Body Mass Index: 23.62 kg/m      Reproductive/Obstetrics negative OB ROS                             Anesthesia Physical Anesthesia Plan  ASA: 3  Anesthesia Plan: General   Post-op Pain Management:    Induction: Intravenous  PONV Risk Score and Plan: Propofol infusion and TIVA  Airway Management Planned: Natural Airway and Nasal Cannula  Additional Equipment:   Intra-op Plan:   Post-operative Plan:   Informed Consent: I have reviewed the patients History and Physical, chart, labs and discussed the procedure including the risks, benefits and alternatives for the proposed anesthesia with the patient or authorized representative who has indicated his/her understanding and acceptance.     Dental Advisory Given  Plan Discussed with: Anesthesiologist, CRNA and Surgeon  Anesthesia Plan Comments: (Patient consented for risks of anesthesia including but not limited to:  - adverse reactions to medications - risk of airway placement if required - damage to eyes, teeth, lips or other oral mucosa -  nerve damage due to positioning  - sore throat or hoarseness - Damage to heart, brain, nerves, lungs, other parts of body or loss of life  Patient voiced understanding.)        Anesthesia Quick Evaluation

## 2021-10-28 NOTE — H&P (Signed)
Lucilla Lame, MD [address] [place], [place] [address] Phone:[phone] Fax : [phone]  Primary Care Physician:  Tracie Harrier, MD Primary Gastroenterologist:  Dr. Allen Norris  Pre-Procedure History & Physical: HPI:  Logan Morgan is a 81 y.o. male is here for an endoscopy and colonoscopy.   Past Medical History:  Diagnosis Date   Atrial fibrillation ([place])    COPD (chronic obstructive pulmonary disease) ([place])    Dyspnea    Pt reports due to inactivity   Dysrhythmia    Emphysema lung (HCC)    Prostate cancer [facility])     Past Surgical History:  Procedure Laterality Date   CATARACT EXTRACTION W/PHACO Left 09/15/2021   Procedure: CATARACT EXTRACTION PHACO AND INTRAOCULAR LENS PLACEMENT (IOC) LEFT 12.74 01:04.8;  Surgeon: Eulogio Bear, MD;  Location: [place];  Service: Ophthalmology;  Laterality: Left;   CATARACT EXTRACTION W/PHACO Right 09/29/2021   Procedure: CATARACT EXTRACTION PHACO AND INTRAOCULAR LENS PLACEMENT (IOC) RIGHT;  Surgeon: Eulogio Bear, MD;  Location: [place];  Service: Ophthalmology;  Laterality: Right;  14.81 01:15.8   COLONOSCOPY     EYE SURGERY     HAND SURGERY     x3. as child   RADIOACTIVE SEED IMPLANT     TONSILLECTOMY     as child    Prior to Admission medications   Medication Sig Start Date End Date Taking? Authorizing Provider  amiodarone (PACERONE) 200 MG tablet Take 2 tablets (400 mg total) by mouth 2 (two) times daily. Patient taking differently: Take 400 mg by mouth daily. 07/31/16  Yes Epifanio Lesches, MD  atorvastatin (LIPITOR) 20 MG tablet Take 20 mg by mouth daily.   Yes [provider]  ferrous sulfate 325 (65 FE) MG tablet Take 325 mg by mouth daily.   Yes [provider]  furosemide (LASIX) 20 MG tablet Take 20 mg by mouth daily.   Yes [provider]  glucosamine-chondroitin 500-400 MG tablet Take 1 tablet by mouth 2 (two) times daily.    Yes  [provider]  metoprolol succinate (TOPROL-XL) 25 MG 24 hr tablet Take 25 mg by mouth daily.   Yes [provider]  Multiple Vitamin (ONE-A-DAY MENS PO) Take 1 tablet by mouth daily.   Yes [provider]  olmesartan (BENICAR) 40 MG tablet Take by mouth. 06/07/19  Yes [provider]  Omega-3 Fatty Acids (FISH OIL) 1000 MG CAPS Take by mouth daily.   Yes [provider]  potassium chloride SA (K-DUR,KLOR-CON) 20 MEQ tablet Take 1 tablet (20 mEq total) by mouth daily. Patient taking differently: Take 20 mEq by mouth 2 (two) times daily. 10/26/14  Yes Teodoro Spray, MD  pravastatin (PRAVACHOL) 40 MG tablet Take 40 mg by mouth daily.   Yes [provider]  sertraline (ZOLOFT) 100 MG tablet Take 150 mg by mouth daily.   Yes [provider]  sertraline (ZOLOFT) 50 MG tablet Take 1 tablet by mouth daily. 08/13/20  Yes [provider]  tamsulosin (FLOMAX) 0.4 MG CAPS capsule Take 0.4 mg by mouth daily.   Yes [provider]  Tiotropium Bromide-Olodaterol (STIOLTO RESPIMAT) 2.5-2.5 MCG/ACT AERS Inhale 2 puffs into the lungs daily. 10/23/21  Yes Collene Gobble, MD  albuterol (VENTOLIN HFA) 108 (90 Base) MCG/ACT inhaler Inhale 2 puffs into the lungs every 6 (six) hours as needed for wheezing or shortness of breath. 08/28/21   Byrum, Rose Fillers, MD  rivaroxaban (XARELTO) 20 MG TABS  tablet Take 20 mg by mouth daily with supper. Patient not taking: Reported on 10/28/2021    [provider]  TRELEGY ELLIPTA 100-62.5-25 MCG/INH AEPB Inhale 1 puff into the lungs daily. Patient not taking: Reported on 10/28/2021 04/06/20   [provider]    Allergies as of 09/22/2021   (No Known Allergies)    Family History  Problem Relation Age of Onset   COPD Mother     Social History   Socioeconomic History   Marital status: Married    Spouse name: Not on file   Number of children: Not on file   Years of education:  Not on file   Highest education level: Not on file  Occupational History   Not on file  Tobacco Use   Smoking status: Former    Years: 38.00    Types: Cigarettes    Quit date: 1998    Years since quitting: 25.4   Smokeless tobacco: Never  Vaping Use   Vaping Use: Never used  Substance and Sexual Activity   Alcohol use: Yes    Alcohol/week: 18.0 standard drinks    Types: 18 Standard drinks or equivalent per week    Comment: 2-3 vodkas daily   Drug use: No   Sexual activity: Not on file  Other Topics Concern   Not on file  Social History Narrative   Not on file   Social Determinants of Health   Financial Resource Strain: Not on file  Food Insecurity: Not on file  Transportation Needs: Not on file  Physical Activity: Not on file  Stress: Not on file  Social Connections: Not on file  Intimate Partner Violence: Not on file    Review of Systems: See HPI, otherwise negative ROS  Physical Exam: BP (!) 156/89   Pulse 68   Temp (!) 96.8 F (36 C) (Temporal)   Resp 18   Ht '5\' 8"'$  (1.727 m)   Wt 70.5 kg   SpO2 92%   BMI 23.62 kg/m  General:   Alert,  pleasant and cooperative in NAD Head:  Normocephalic and atraumatic. Neck:  Supple; no masses or thyromegaly. Lungs:  Clear throughout to auscultation.    Heart:  Regular rate and rhythm. Abdomen:  Soft, nontender and nondistended. Normal bowel sounds, without guarding, and without rebound.   Neurologic:  Alert and  oriented x4;  grossly normal neurologically.  Impression/Plan: Logan Morgan is here for an endoscopy and colonoscopy to be performed for IDA  Risks, benefits, limitations, and alternatives regarding  endoscopy and colonoscopy have been reviewed with the patient.  Questions have been answered.  All parties agreeable.   Lucilla Lame, MD  10/28/2021, 9:20 AM

## 2021-10-28 NOTE — Op Note (Signed)
[facility] Patient Name: Logan Morgan Procedure Date: 10/28/2021 9:11 AM MRN: [id number] Account #: [id number] Date of Birth: 1941/05/10 Admit Type: Outpatient Age: 81 Room: [facility] ENDO ROOM 4 Gender: Male Note Status: Finalized Instrument Name: Park Meo [id number] Procedure:             Colonoscopy Indications:           Iron deficiency anemia Providers:             Lucilla Lame MD, MD Referring MD:          Tracie Harrier, MD (Referring MD) Medicines:             Propofol per Anesthesia Complications:         No immediate complications. Procedure:             Pre-Anesthesia Assessment:                        - Prior to the procedure, a History and Physical was                         performed, and patient medications and allergies were                         reviewed. The patient's tolerance of previous                         anesthesia was also reviewed. The risks and benefits                         of the procedure and the sedation options and risks                         were discussed with the patient. All questions were                         answered, and informed consent was obtained. Prior                         Anticoagulants: The patient has taken no previous                         anticoagulant or antiplatelet agents. ASA Grade                         Assessment: II - A patient with mild systemic disease.                         After reviewing the risks and benefits, the patient                         was deemed in satisfactory condition to undergo the                         procedure.                        After obtaining informed consent, the colonoscope was  passed under direct vision. Throughout the procedure,                         the patient's blood pressure, pulse, and oxygen                         saturations were monitored continuously. The                         Colonoscope was  introduced through the anus and                         advanced to the the cecum, identified by appendiceal                         orifice and ileocecal valve. The colonoscopy was                         performed without difficulty. The patient tolerated                         the procedure well. The quality of the bowel                         preparation was excellent. Findings:      The perianal and digital rectal examinations were normal.      Multiple small-mouthed diverticula were found in the entire colon.      Non-bleeding internal hemorrhoids were found during retroflexion. The       hemorrhoids were Grade II (internal hemorrhoids that prolapse but reduce       spontaneously).      A 2 mm polyp was found in the transverse colon. The polyp was sessile.       The polyp was removed with a cold biopsy forceps. Resection and       retrieval were complete. Impression:            - Diverticulosis in the entire examined colon.                        - Non-bleeding internal hemorrhoids.                        - One 2 mm polyp in the transverse colon, removed with                         a cold biopsy forceps. Resected and retrieved. Recommendation:        - Discharge patient to home.                        - Resume previous diet.                        - To visualize the small bowel, perform video capsule                         endoscopy at appointment to be scheduled. Procedure Code(s):     --- Professional ---  45380, Colonoscopy, flexible; with biopsy, single or                         multiple Diagnosis Code(s):     --- Professional ---                        D50.9, Iron deficiency anemia, unspecified                        K63.5, Polyp of colon CPT copyright 2019 American Medical Association. All rights reserved. The codes documented in this report are preliminary and upon coder review may  be revised to meet current compliance requirements. Lucilla Lame  MD, MD 10/28/2021 9:47:25 AM This report has been signed electronically. Number of Addenda: 0 Note Initiated On: 10/28/2021 9:11 AM Scope Withdrawal Time: 0 hours 6 minutes 9 seconds  Total Procedure Duration: 0 hours 8 minutes 6 seconds  Estimated Blood Loss:  Estimated blood loss: none.      [facility]

## 2021-10-28 NOTE — Anesthesia Procedure Notes (Signed)
Date/Time: 10/28/2021 9:33 AM Performed by: Lily Peer, Saajan Willmon, CRNA Pre-anesthesia Checklist: Patient identified, Emergency Drugs available, Suction available, Patient being monitored and Timeout performed Patient Re-evaluated:Patient Re-evaluated prior to induction Oxygen Delivery Method: Simple face mask Induction Type: IV induction

## 2021-10-29 ENCOUNTER — Encounter: Payer: Self-pay | Admitting: Gastroenterology

## 2021-10-29 LAB — SURGICAL PATHOLOGY

## 2021-10-31 ENCOUNTER — Other Ambulatory Visit: Payer: Self-pay | Admitting: *Deleted

## 2021-10-31 DIAGNOSIS — N2889 Other specified disorders of kidney and ureter: Secondary | ICD-10-CM

## 2021-11-04 ENCOUNTER — Ambulatory Visit: Payer: Self-pay | Admitting: Urology

## 2021-11-05 ENCOUNTER — Telehealth: Payer: Self-pay

## 2021-11-05 NOTE — Telephone Encounter (Signed)
Left message on voicemail for pt to r/t my call to schedule capsule study

## 2021-11-07 ENCOUNTER — Other Ambulatory Visit: Payer: Self-pay

## 2021-11-07 DIAGNOSIS — D500 Iron deficiency anemia secondary to blood loss (chronic): Secondary | ICD-10-CM

## 2021-11-07 NOTE — Telephone Encounter (Signed)
Capsule study scheduled for 11/24/21 and instructions gone over with pt and mailed

## 2021-11-12 ENCOUNTER — Telehealth: Payer: Self-pay | Admitting: Emergency Medicine

## 2021-11-13 ENCOUNTER — Ambulatory Visit
Admission: RE | Admit: 2021-11-13 | Discharge: 2021-11-13 | Disposition: A | Discharge status: Home / Self Care | Payer: Medicare HMO | Source: Ambulatory Visit | Attending: Urology | Admitting: Urology

## 2021-11-13 DIAGNOSIS — N2889 Other specified disorders of kidney and ureter: Secondary | ICD-10-CM

## 2021-11-13 IMAGING — MR MR ABDOMEN WO/W CM
17 series · 48 of 48 positions shown · IV contrast (14 mL Multihance)
Comparison: Multiple priors including most recent MRI [DATE]

CLINICAL DATA: Evaluation of renal mass.

EXAM:
MRI ABDOMEN WITHOUT AND WITH CONTRAST
TECHNIQUE: Multiplanar multisequence MR imaging of the abdomen was performed
both before and after the administration of intravenous contrast.
CONTRAST:  14mL MULTIHANCE GADOBENATE DIMEGLUMINE 529 MG/ML IV SOLN

[Series 2: T2 · coronal · 5.0mm · 1.56mm/px · 2 of 38 slices shown (1 of 3)]
[im 1/38]
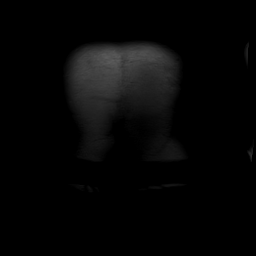
[im 38/38]
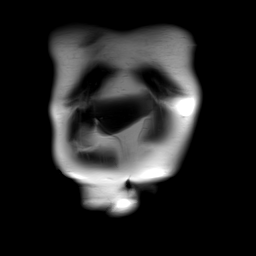

[Series 3: T1 · axial · 6.0mm · 0.74mm/px · z∈[-138,+100]mm · 3 of 68 slices shown]
[im 1/68]
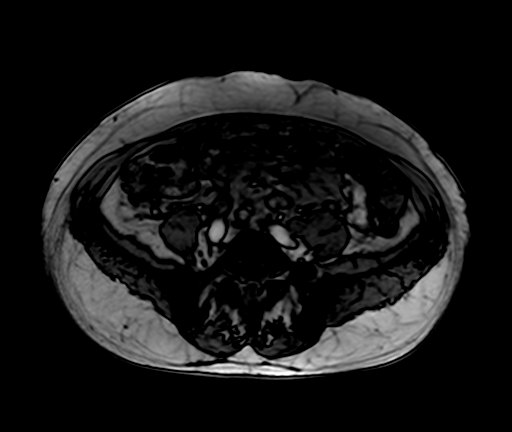
[im 34/68]
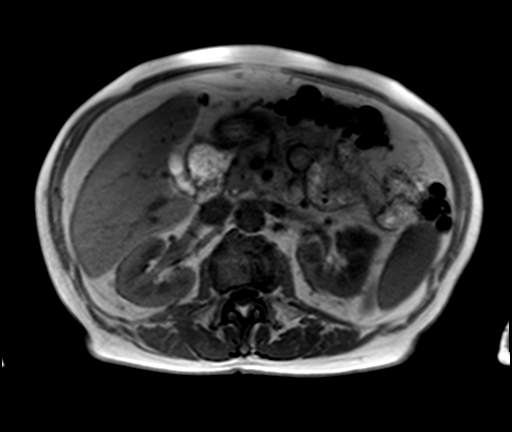
[im 68/68]
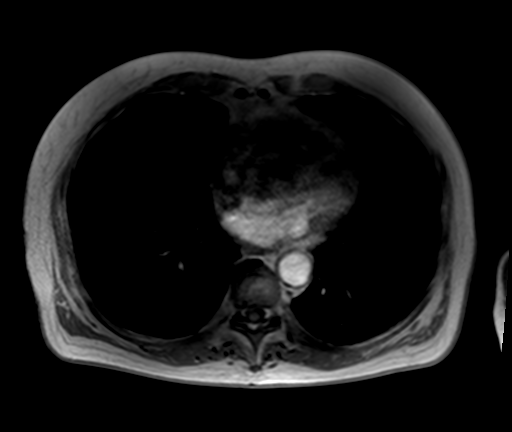

[Series 4: T2 · axial · 6.0mm · 1.22mm/px · 1 of 34 slices shown (2 of 3)]
[im 1/34]
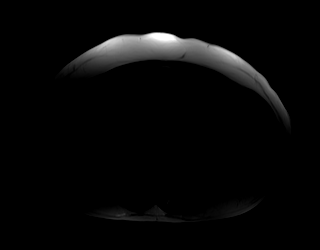

[Series 5: T2 · axial · 5.5mm · 1.48mm/px · z∈[-115,+129]mm · 2 of 38 slices shown (3 of 3)]
[im 1/38]
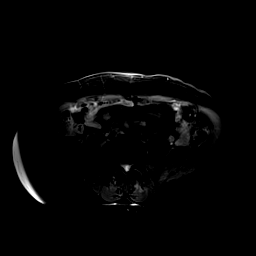
[im 38/38]
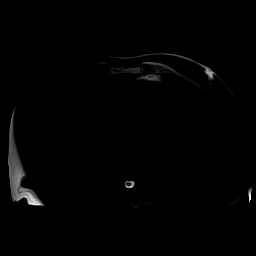

[Series 6: DWI · axial · 5.4mm · 1.42mm/px · z∈[-113,+127]mm · 5 of 114 slices shown (1 of 2)]
[im 1/114]
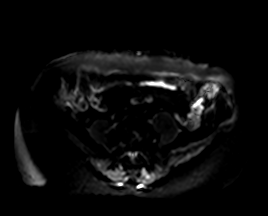
[im 29/114]
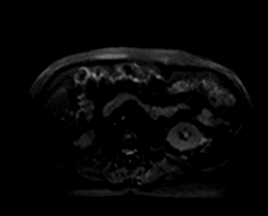
[im 57/114]
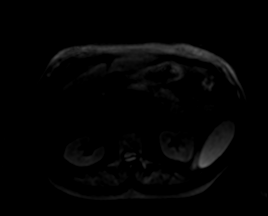
[im 85/114]
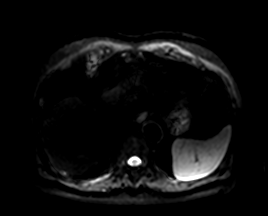
[im 114/114]
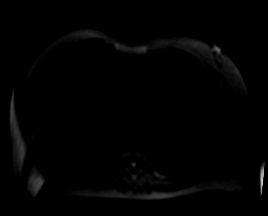

[Series 7: DWI · axial · 5.4mm · 1.42mm/px · z∈[-113,+127]mm · 2 of 38 slices shown (2 of 2)]
[im 1/38]
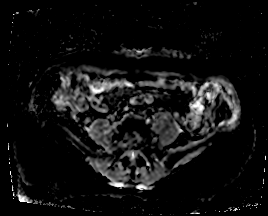
[im 38/38]
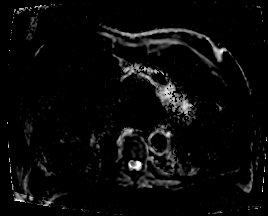

[Series 8: T1 dynamic · axial · non-contrast · 3.0mm · 1.25mm/px · z∈[-137,+76]mm · 3 of 72 slices shown]
[im 1/72]
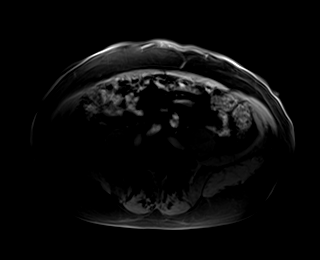
[im 36/72]
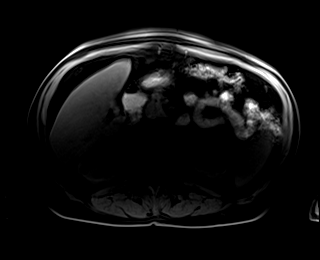
[im 72/72]
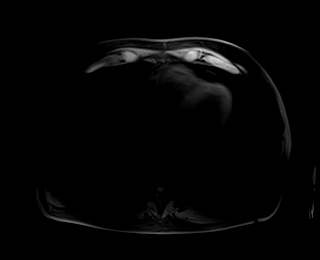

[Series 9: T1 dynamic post-contrast · axial · 3.0mm · 1.25mm/px · z∈[-137,+76]mm · 3 of 72 slices shown (1 of 10)]
[im 1/72]
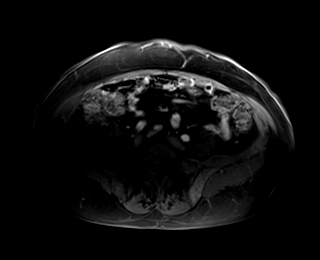
[im 36/72]
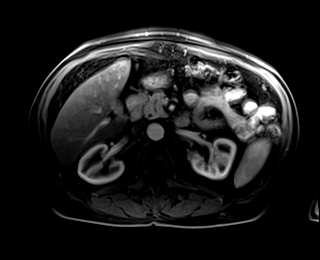
[im 72/72]
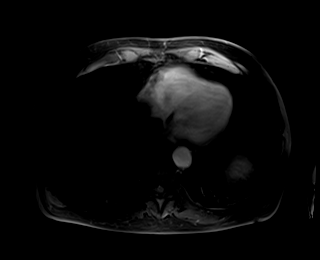

[Series 10: T1 dynamic post-contrast · axial · 3.0mm · 1.25mm/px · z∈[-137,+76]mm · 3 of 72 slices shown (2 of 10)]
[im 1/72]
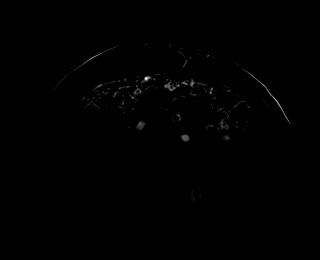
[im 36/72]
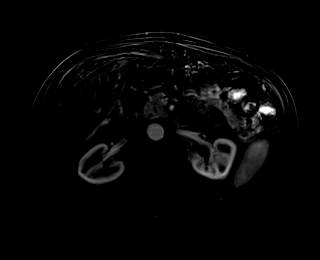
[im 72/72]
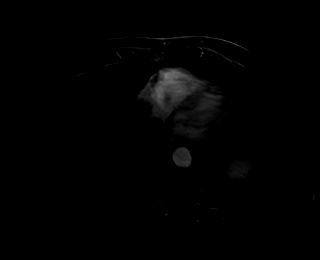

[Series 11: T1 dynamic post-contrast · axial · 3.0mm · 1.25mm/px · z∈[-137,+76]mm · 3 of 72 slices shown (3 of 10)]
[im 1/72]
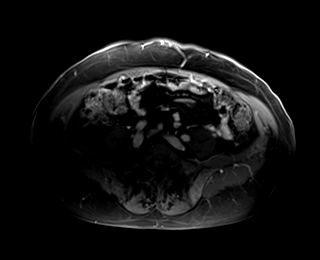
[im 36/72]
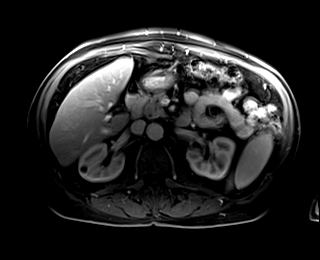
[im 72/72]
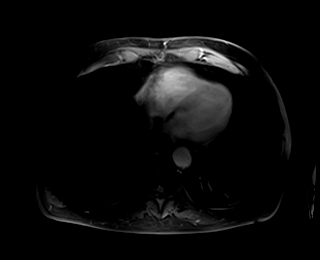

[Series 12: T1 dynamic post-contrast · axial · 3.0mm · 1.25mm/px · z∈[-137,+76]mm · 3 of 72 slices shown (4 of 10)]
[im 1/72]
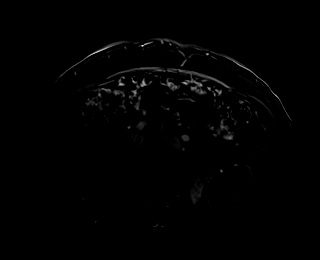
[im 36/72]
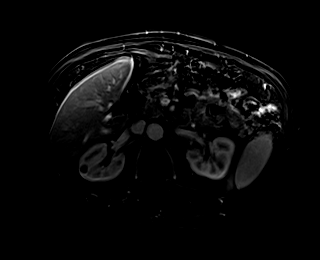
[im 72/72]
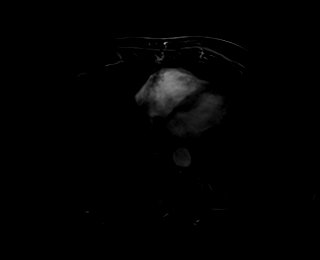

[Series 13: T1 dynamic post-contrast · axial · 3.0mm · 1.25mm/px · z∈[-137,+76]mm · 3 of 72 slices shown (5 of 10)]
[im 1/72]
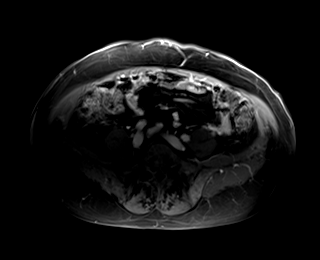
[im 36/72]
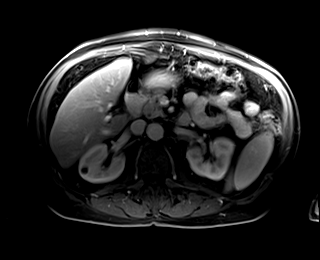
[im 72/72]
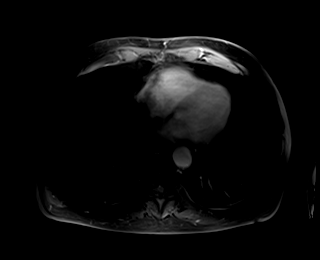

[Series 14: T1 dynamic post-contrast · axial · 3.0mm · 1.25mm/px · z∈[-137,+76]mm · 3 of 72 slices shown (6 of 10)]
[im 1/72]
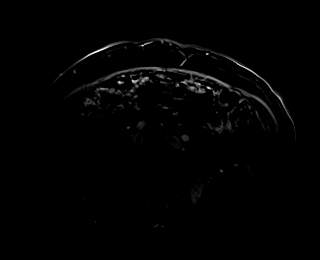
[im 36/72]
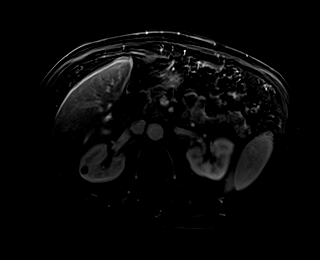
[im 72/72]
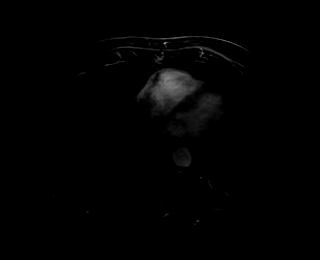

[Series 15: T1 dynamic post-contrast · coronal · 3.0mm · 1.25mm/px · 3 of 80 slices shown (7 of 10)]
[im 1/80]
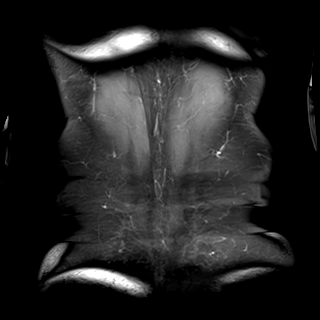
[im 40/80]
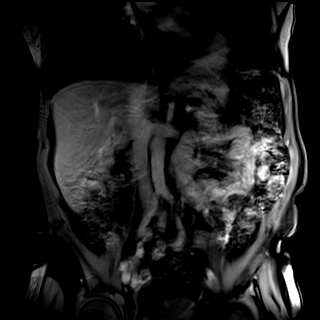
[im 80/80]
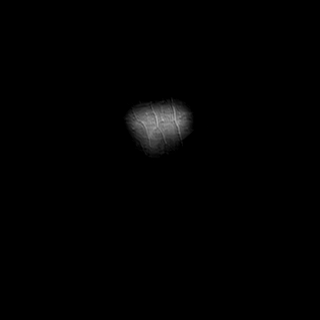

[Series 18: T1 dynamic post-contrast · coronal · 3.0mm · 1.25mm/px · 3 of 80 slices shown (8 of 10)]
[im 1/80]
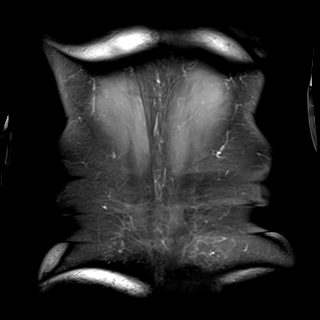
[im 40/80]
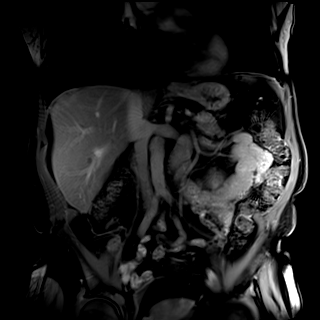
[im 80/80]
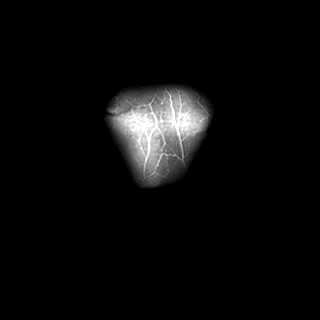

[Series 19: T1 dynamic post-contrast · axial · 3.0mm · 1.25mm/px · z∈[-137,+76]mm · 3 of 72 slices shown (9 of 10)]
[im 1/72]
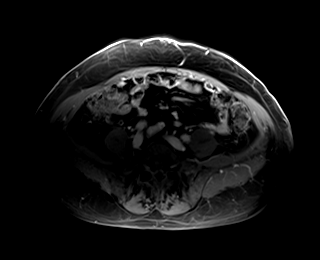
[im 36/72]
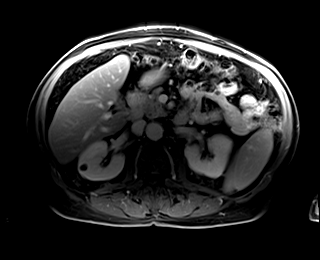
[im 72/72]
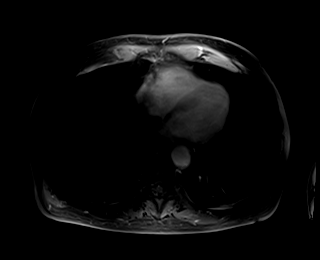

[Series 20: T1 dynamic post-contrast · axial · 3.0mm · 1.25mm/px · z∈[-137,+76]mm · 3 of 72 slices shown (10 of 10)]
[im 1/72]
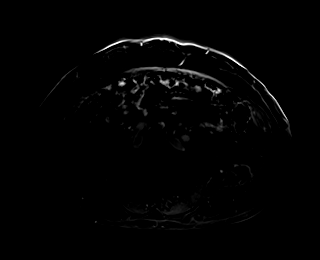
[im 36/72]
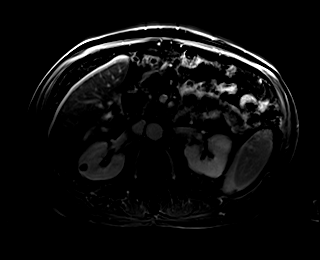
[im 72/72]
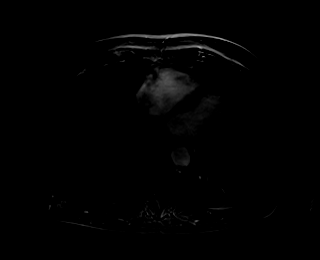

[48 of 48 positions shown; findings below may reference images not displayed]

FINDINGS: Lower chest: No acute abnormality.

Hepatobiliary: No significant hepatic steatosis. Scattered hepatic
cysts. No suspicious hepatic lesion. Gallbladder is unremarkable.

Pancreas: No pancreatic ductal dilation or evidence of acute
inflammation. No cystic or solid hyperenhancing pancreatic lesion
identified.

Spleen:  Splenomegaly.

Adrenals/Urinary Tract:  Bilateral adrenal glands appear normal.

Right upper pole 12 mm renal lesion mildly T2 hyperintense,
intrinsically T1 hypointense and demonstrates postcontrast
enhancement, consistent with a small renal cell carcinoma.

Subcapsular lesion in the right interpolar kidney measures 5 mm on
image 35/10, unchanged when remeasured for consistency, the lesion
is intrinsically T1 hyperintense and while difficult to accurately
characterize given its small size does not definitely demonstrate
postcontrast enhancement once again favored to reflect a benign
hemorrhagic/proteinaceous renal cysts.

Nonenhancing bilateral renal cysts measure up to 14 mm and are
benign requiring no independent follow-up.

Stomach/Bowel: Colonic diverticulosis without findings of acute
diverticulitis.

Vascular/Lymphatic: No pathologically enlarged lymph nodes
identified. No abdominal aortic aneurysm demonstrated.

Other:  No abdominal ascites.

Musculoskeletal: No suspicious bone lesions identified.
IMPRESSION: Unchanged size of the enhancing 12 mm right upper pole renal lesion
consistent with a small renal cell carcinoma.

Unchanged size of the 5 mm right interpolar renal lesion which is
difficult to characterize given its small size but is stable and
likely reflects a tiny proteinaceous/hemorrhagic cysts. Continued
attention on follow-up imaging suggested.

No evidence of abdominal metastatic disease.

## 2021-11-13 MED ORDER — GADOBENATE DIMEGLUMINE 529 MG/ML IV SOLN
14.0000 mL | Freq: Once | INTRAVENOUS | Status: AC | PRN
  Administered 2021-11-13: 14 mL via INTRAVENOUS

## 2021-11-13 MED ORDER — STIOLTO RESPIMAT 2.5-2.5 MCG/ACT IN AERS: 2.0000 | INHALATION_SPRAY | Freq: Every day | RESPIRATORY_TRACT | 4 refills | Status: DC

## 2021-11-13 NOTE — Telephone Encounter (Signed)
Called and left voicemail stating that I was sending in refills as requested to [facility]. And that if he would still like a sample he would need to call the office to determine if we have samples and that it would be upfront waiting for him. Nothing furthe rneeded

## 2021-11-14 ENCOUNTER — Telehealth: Payer: Self-pay | Admitting: Emergency Medicine

## 2021-11-14 NOTE — Telephone Encounter (Signed)
Called and spoke with patient. Patient stated that got samples earlier this morning. Nothing further needed.

## 2021-11-15 ENCOUNTER — Other Ambulatory Visit: Payer: Self-pay | Admitting: Emergency Medicine

## 2021-11-18 ENCOUNTER — Ambulatory Visit (INDEPENDENT_AMBULATORY_CARE_PROVIDER_SITE_OTHER): Payer: Medicare HMO | Admitting: Urology

## 2021-11-18 ENCOUNTER — Encounter: Payer: Self-pay | Admitting: Urology

## 2021-11-18 VITALS — BP 103/61 | HR 68 | Ht 68.0 in | Wt 155.0 lb

## 2021-11-18 DIAGNOSIS — N2889 Other specified disorders of kidney and ureter: Secondary | ICD-10-CM

## 2021-11-18 NOTE — Progress Notes (Signed)
11/18/21 12:26 PM   Logan Morgan July 14, 1940 [id number]  Referring provider:  Tracie Harrier, MD [address] [facility] [place],  [place] [address] Chief Complaint  Patient presents with   Follow-up      HPI: Logan Morgan is a 81 y.o.male with a personal history of renal mass who presents today for annual follow-up with MRI.   He follows up today with serial MRI. He underwent MRI on 11/13/2021. This shows Unchanged size of the enhancing 12 mm right upper pole renal lesion consistent with a small renal cell carcinoma. Unchanged size of the 5 mm right interpolar renal lesion which is difficult to characterize given its small size but is stable and likely reflects a tiny proteinaceous/hemorrhagic cysts.  He remains asymptomatic.  No flank pain or hematuria.  He is being worked up for anemia.   PMH: Past Medical History:  Diagnosis Date   Atrial fibrillation ([place])    COPD (chronic obstructive pulmonary disease) ([place])    Dyspnea    Pt reports due to inactivity   Dysrhythmia    Emphysema lung (HCC)    Prostate cancer [facility])     Surgical History: Past Surgical History:  Procedure Laterality Date   CATARACT EXTRACTION W/PHACO Left 09/15/2021   Procedure: CATARACT EXTRACTION PHACO AND INTRAOCULAR LENS PLACEMENT (IOC) LEFT 12.74 01:04.8;  Surgeon: Eulogio Bear, MD;  Location: [place];  Service: Ophthalmology;  Laterality: Left;   CATARACT EXTRACTION W/PHACO Right 09/29/2021   Procedure: CATARACT EXTRACTION PHACO AND INTRAOCULAR LENS PLACEMENT (IOC) RIGHT;  Surgeon: Eulogio Bear, MD;  Location: [place];  Service: Ophthalmology;  Laterality: Right;  14.81 01:15.8   COLONOSCOPY     COLONOSCOPY WITH PROPOFOL N/A 10/28/2021   Procedure: COLONOSCOPY WITH PROPOFOL;  Surgeon: Lucilla Lame, MD;  Location: [facility] ENDOSCOPY;  Service: Endoscopy;  Laterality: N/A;   ESOPHAGOGASTRODUODENOSCOPY N/A 10/28/2021   Procedure:  ESOPHAGOGASTRODUODENOSCOPY (EGD);  Surgeon: Lucilla Lame, MD;  Location: [facility] ENDOSCOPY;  Service: Endoscopy;  Laterality: N/A;   EYE SURGERY     HAND SURGERY     x3. as child   RADIOACTIVE SEED IMPLANT     TONSILLECTOMY     as child    Home Medications:  Allergies as of 11/18/2021   No Known Allergies      Medication List        Accurate as of November 18, 2021 12:26 PM. If you have any questions, ask your nurse or doctor.          STOP taking these medications    Trelegy Ellipta 100-62.5-25 MCG/ACT Aepb Generic drug: Fluticasone-Umeclidin-Vilant Stopped by: [name], MD       TAKE these medications    albuterol 108 (90 Base) MCG/ACT inhaler Commonly known as: VENTOLIN HFA Inhale 2 puffs into the lungs every 6 (six) hours as needed for wheezing or shortness of breath.   amiodarone 200 MG tablet Commonly known as: Pacerone Take 2 tablets (400 mg total) by mouth 2 (two) times daily. What changed: when to take this   atorvastatin 20 MG tablet Commonly known as: LIPITOR Take 20 mg by mouth daily.   ferrous sulfate 325 (65 FE) MG tablet Take 325 mg by mouth daily.   Fish Oil 1000 MG Caps Take by mouth daily.   furosemide 20 MG tablet Commonly known as: LASIX Take 20 mg by mouth daily.   glucosamine-chondroitin 500-400 MG tablet Take 1 tablet by mouth 2 (two) times daily.  metoprolol succinate 25 MG 24 hr tablet Commonly known as: TOPROL-XL Take 25 mg by mouth daily.   olmesartan 40 MG tablet Commonly known as: BENICAR Take by mouth.   ONE-A-DAY MENS PO Take 1 tablet by mouth daily.   potassium chloride SA 20 MEQ tablet Commonly known as: KLOR-CON M Take 1 tablet (20 mEq total) by mouth daily. What changed: when to take this   pravastatin 40 MG tablet Commonly known as: PRAVACHOL Take 40 mg by mouth daily.   rivaroxaban 20 MG Tabs tablet Commonly known as: XARELTO Take 20 mg by mouth daily with supper.   sertraline 50 MG  tablet Commonly known as: ZOLOFT Take 1 tablet by mouth daily. What changed: Another medication with the same name was removed. Continue taking this medication, and follow the directions you see here. Changed by: [name], MD   sertraline 100 MG tablet Commonly known as: ZOLOFT Take by mouth. What changed: Another medication with the same name was removed. Continue taking this medication, and follow the directions you see here. Changed by: [name], MD   Stiolto Respimat 2.5-2.5 MCG/ACT Aers Generic drug: Tiotropium Bromide-Olodaterol Inhale 2 puffs into the lungs daily.   tamsulosin 0.4 MG Caps capsule Commonly known as: FLOMAX Take 0.4 mg by mouth daily.        Allergies:  No Known Allergies  Family History: Family History  Problem Relation Age of Onset   COPD Mother     Social History:  reports that he quit smoking about 25 years ago. His smoking use included cigarettes. He has never used smokeless tobacco. He reports current alcohol use of about 18.0 standard drinks of alcohol per week. He reports that he does not use drugs.   Physical Exam: BP 103/61   Pulse 68   Ht '5\' 8"'$  (1.727 m)   Wt 155 lb (70.3 kg)   BMI 23.57 kg/m   Constitutional:  Alert and oriented, No acute distress. HEENT: [place] AT, moist mucus membranes.  Trachea midline, no masses. Cardiovascular: No clubbing, cyanosis, or edema. Respiratory: Normal respiratory effort, no increased work of breathing. Skin: No rashes, bruises or suspicious lesions. Neurologic: Grossly intact, no focal deficits, moving all 4 extremities. Psychiatric: Normal mood and affect.  Laboratory Data:  Lab Results  Component Value Date   CREATININE 1.10 06/11/2020    Pertinent Imaging: CLINICAL DATA:  Evaluation of renal mass.   EXAM: MRI ABDOMEN WITHOUT AND WITH CONTRAST   TECHNIQUE: Multiplanar multisequence MR imaging of the abdomen was performed both before and after the administration of  intravenous contrast.   CONTRAST:  2m MULTIHANCE GADOBENATE DIMEGLUMINE 529 MG/ML IV SOLN   COMPARISON:  Multiple priors including most recent MRI Oct 11, 2020   FINDINGS: Lower chest: No acute abnormality.   Hepatobiliary: No significant hepatic steatosis. Scattered hepatic cysts. No suspicious hepatic lesion. Gallbladder is unremarkable.   Pancreas: No pancreatic ductal dilation or evidence of acute inflammation. No cystic or solid hyperenhancing pancreatic lesion identified.   Spleen:  Splenomegaly.   Adrenals/Urinary Tract:  Bilateral adrenal glands appear normal.   Right upper pole 12 mm renal lesion mildly T2 hyperintense, intrinsically T1 hypointense and demonstrates postcontrast enhancement, consistent with a small renal cell carcinoma.   Subcapsular lesion in the right interpolar kidney measures 5 mm on image 35/10, unchanged when remeasured for consistency, the lesion is intrinsically T1 hyperintense and while difficult to accurately characterize given its small size does not definitely demonstrate postcontrast enhancement once again favored to reflect  a benign hemorrhagic/proteinaceous renal cysts.   Nonenhancing bilateral renal cysts measure up to 14 mm and are benign requiring no independent follow-up.   Stomach/Bowel: Colonic diverticulosis without findings of acute diverticulitis.   Vascular/Lymphatic: No pathologically enlarged lymph nodes identified. No abdominal aortic aneurysm demonstrated.   Other:  No abdominal ascites.   Musculoskeletal: No suspicious bone lesions identified.   IMPRESSION: Unchanged size of the enhancing 12 mm right upper pole renal lesion consistent with a small renal cell carcinoma.   Unchanged size of the 5 mm right interpolar renal lesion which is difficult to characterize given its small size but is stable and likely reflects a tiny proteinaceous/hemorrhagic cysts. Continued attention on follow-up imaging suggested.    No evidence of abdominal metastatic disease.     Electronically Signed   By: Dahlia Bailiff M.D.   On: 11/16/2021 08:14   I have personally reviewed the images and agree with radiologist interpretation.     Assessment & Plan:    1. Renal mass - Small right upper pole enhancing renal mass, 12 mm without interval growth x nearly 18 months -We discussed the differential diagnosis for this which may indicate possibly a benign process such as oncocytoma versus indolent renal cell carcinoma -Based on the size and lack of interval change, I have strongly recommended continued observation as there is very low risk of significant interval growth as well as metastatic disease -Recommend increasing interval to 18 months based on lack of change over the course of a year -He is agreeable this plan  Follow-up 18 months with MRI of the abdomen with and without contrast    Conley Rolls as a scribe for [name], MD.,have documented all relevant documentation on the behalf of [name], MD,as directed by  [name], MD while in the presence of [name], MD.  I have reviewed the above documentation for accuracy and completeness, and I agree with the above.   [name], MD   [facility] [address], [place], [place] [address] [phone]

## 2021-11-21 ENCOUNTER — Telehealth: Payer: Self-pay | Admitting: Gastroenterology

## 2021-11-21 NOTE — Telephone Encounter (Signed)
Patient has some questions about his procedure on Monday. Requesting call back asap.

## 2021-11-21 NOTE — Telephone Encounter (Signed)
Pt had question about prep... Pt is aware of instructions and expressed understanding

## 2021-11-24 ENCOUNTER — Ambulatory Visit
Admission: RE | Admit: 2021-11-24 | Discharge: 2021-11-24 | Disposition: A | Discharge status: Home / Self Care | Payer: Medicare HMO | Attending: Gastroenterology | Admitting: Gastroenterology

## 2021-11-24 ENCOUNTER — Encounter
Admission: RE | Disposition: A | Discharge status: Home / Self Care | Payer: Self-pay | Source: Home / Self Care | Attending: Gastroenterology

## 2021-11-24 DIAGNOSIS — D500 Iron deficiency anemia secondary to blood loss (chronic): Secondary | ICD-10-CM | POA: Diagnosis not present

## 2021-11-24 DIAGNOSIS — D509 Iron deficiency anemia, unspecified: Secondary | ICD-10-CM | POA: Diagnosis present

## 2021-11-24 HISTORY — PX: GIVENS CAPSULE STUDY: SHX5432

## 2021-11-24 SURGERY — IMAGING PROCEDURE, GI TRACT, INTRALUMINAL, VIA CAPSULE

## 2021-11-25 ENCOUNTER — Encounter: Payer: Self-pay | Admitting: Gastroenterology

## 2021-11-25 ENCOUNTER — Telehealth: Payer: Self-pay | Admitting: Emergency Medicine

## 2021-11-25 ENCOUNTER — Ambulatory Visit: Payer: Medicare HMO | Admitting: Gastroenterology

## 2021-11-27 ENCOUNTER — Other Ambulatory Visit (HOSPITAL_COMMUNITY): Payer: Self-pay

## 2021-11-27 ENCOUNTER — Telehealth: Payer: Self-pay

## 2021-11-27 NOTE — Telephone Encounter (Signed)
Called patient and he states that the inhaler Stilito is non formulary with his insurance.   Can we run a ticket for his insurance to see what is covered and if anything will require a PA.  Thank you

## 2021-11-27 NOTE — Telephone Encounter (Signed)
Patient Advocate Encounter   Received notification that prior authorization for Stiolto Respimat 2.5-2.5MCG/ACT aerosol is required.   PA submitted on 11/27/2021 Key [id number] Status is pending

## 2021-12-02 ENCOUNTER — Telehealth: Payer: Self-pay

## 2021-12-02 ENCOUNTER — Other Ambulatory Visit (HOSPITAL_COMMUNITY): Payer: Self-pay

## 2021-12-03 NOTE — Telephone Encounter (Signed)
Left message for patient to call back  

## 2021-12-03 NOTE — Telephone Encounter (Signed)
The past substitute here would be Bevespi.  If he is willing to start this then please start 2 puffs twice a day.

## 2021-12-05 NOTE — Telephone Encounter (Signed)
Pt is aware of results... I let pt know I will let him know when I can make a copy for him to pick up

## 2021-12-15 ENCOUNTER — Telehealth: Payer: Self-pay | Admitting: Emergency Medicine

## 2021-12-15 MED ORDER — BEVESPI AEROSPHERE 9-4.8 MCG/ACT IN AERO: 2.0000 | INHALATION_SPRAY | Freq: Two times a day (BID) | RESPIRATORY_TRACT | 1 refills | Status: DC

## 2021-12-15 NOTE — Telephone Encounter (Signed)
Called and spoke with patient. He is aware that [facility] had approved the [employer]. Patient wishes to have this sent to [facility]. RX has been sent.   Nothing further needed at time of call.

## 2021-12-19 ENCOUNTER — Encounter: Payer: Self-pay | Admitting: Gastroenterology

## 2021-12-23 ENCOUNTER — Telehealth: Payer: Self-pay | Admitting: Emergency Medicine

## 2021-12-23 MED ORDER — BEVESPI AEROSPHERE 9-4.8 MCG/ACT IN AERO: 2.0000 | INHALATION_SPRAY | Freq: Two times a day (BID) | RESPIRATORY_TRACT | 1 refills | Status: DC

## 2021-12-23 NOTE — Telephone Encounter (Signed)
RX sent in to [place].

## 2022-03-10 ENCOUNTER — Other Ambulatory Visit: Payer: Self-pay

## 2022-03-10 ENCOUNTER — Emergency Department: Payer: Medicare HMO

## 2022-03-10 ENCOUNTER — Emergency Department
Admission: EM | Admit: 2022-03-10 | Discharge: 2022-03-11 | Disposition: A | Discharge status: Home / Self Care | Payer: Medicare HMO | Attending: Emergency Medicine | Admitting: Emergency Medicine

## 2022-03-10 DIAGNOSIS — J449 Chronic obstructive pulmonary disease, unspecified: Secondary | ICD-10-CM | POA: Insufficient documentation

## 2022-03-10 DIAGNOSIS — W1839XA Other fall on same level, initial encounter: Secondary | ICD-10-CM | POA: Diagnosis not present

## 2022-03-10 DIAGNOSIS — Z7901 Long term (current) use of anticoagulants: Secondary | ICD-10-CM | POA: Diagnosis not present

## 2022-03-10 DIAGNOSIS — F10129 Alcohol abuse with intoxication, unspecified: Secondary | ICD-10-CM | POA: Insufficient documentation

## 2022-03-10 DIAGNOSIS — S0990XA Unspecified injury of head, initial encounter: Secondary | ICD-10-CM

## 2022-03-10 DIAGNOSIS — F10929 Alcohol use, unspecified with intoxication, unspecified: Secondary | ICD-10-CM

## 2022-03-10 DIAGNOSIS — W19XXXA Unspecified fall, initial encounter: Secondary | ICD-10-CM

## 2022-03-10 DIAGNOSIS — S0101XA Laceration without foreign body of scalp, initial encounter: Secondary | ICD-10-CM | POA: Insufficient documentation

## 2022-03-10 LAB — BASIC METABOLIC PANEL
Anion gap: 8 (ref 5–15)
BUN: 27 mg/dL — ABNORMAL HIGH (ref 8–23)
CO2: 24 mmol/L (ref 22–32)
Calcium: 9.2 mg/dL (ref 8.9–10.3)
Chloride: 104 mmol/L (ref 98–111)
Creatinine, Ser: 1.41 mg/dL — ABNORMAL HIGH (ref 0.61–1.24)
GFR, Estimated: 50 mL/min — ABNORMAL LOW (ref >60)
Glucose, Bld: 86 mg/dL (ref 70–99)
Potassium: 3.7 mmol/L (ref 3.5–5.1)
Sodium: 136 mmol/L (ref 135–145)

## 2022-03-10 LAB — CBC WITH DIFFERENTIAL/PLATELET
Abs Immature Granulocytes: 0.1 10*3/uL — ABNORMAL HIGH (ref 0.00–0.07)
Basophils Absolute: 0.1 10*3/uL (ref 0.0–0.1)
Basophils Relative: 1 %
Eosinophils Absolute: 0.1 10*3/uL (ref 0.0–0.5)
Eosinophils Relative: 2 %
HCT: 37.8 % — ABNORMAL LOW (ref 39.0–52.0)
Hemoglobin: 12.5 g/dL — ABNORMAL LOW (ref 13.0–17.0)
Immature Granulocytes: 2 %
Lymphocytes Relative: 25 %
Lymphs Abs: 1.6 10*3/uL (ref 0.7–4.0)
MCH: 32.8 pg (ref 26.0–34.0)
MCHC: 33.1 g/dL (ref 30.0–36.0)
MCV: 99.2 fL (ref 80.0–100.0)
Monocytes Absolute: 0.9 10*3/uL (ref 0.1–1.0)
Monocytes Relative: 15 %
Neutro Abs: 3.5 10*3/uL (ref 1.7–7.7)
Neutrophils Relative %: 55 %
Platelets: 145 10*3/uL — ABNORMAL LOW (ref 150–400)
RBC: 3.81 MIL/uL — ABNORMAL LOW (ref 4.22–5.81)
RDW: 13.5 % (ref 11.5–15.5)
WBC: 6.3 10*3/uL (ref 4.0–10.5)
nRBC: 0 % (ref 0.0–0.2)

## 2022-03-10 NOTE — ED Provider Notes (Signed)
[facility] Provider Note    Event Date/Time   First MD Initiated Contact with Patient 03/10/22 2328     (approximate)   History   Fall   HPI  Logan Morgan is a 81 y.o. male with a history of atrial fibrillation on Xarelto, COPD, and prostate cancer who presents with head injury and alcohol intoxication.  The patient states that he drank "more than you'd like to know" today and then fell.  He believes he passed out because he does not remember hitting the ground.  He was able to crawl to get help.  He denies any acute pain other than to his head and denies other injuries at this time.  He denies any drug use.  He states he was feeling fine before the fall.     Physical Exam   Triage Vital Signs: ED Triage Vitals  Enc Vitals Group     BP      Pulse      Resp      Temp      Temp src      SpO2      Weight      Height      Head Circumference      Peak Flow      Pain Score      Pain Loc      Pain Edu?      Excl. in GC?     Most recent vital signs: Vitals:   03/10/22 2341  BP: 103/69  Pulse: (!) 57  Resp: 16  Temp: 97.6 F (36.4 C)  SpO2: 92%     General: Awake, oriented x4, no distress.  CV:  Good peripheral perfusion.  Resp:  Normal effort.  Abd:  No distention.  Other:  Mildly intoxicated appearing.  EOMI.  PERRLA.  Motor intact in all extremities.  No pronator drift.  No ataxia on finger-to-nose.  No midline spinal tenderness.  Right parietal scalp with approximately 10 cm hematoma and 4 cm superficial laceration.    ED Results / Procedures / Treatments   Labs (all labs ordered are listed, but only abnormal results are displayed) Labs Reviewed  BASIC METABOLIC PANEL - Abnormal; Notable for the following components:      Result Value   BUN 27 (*)    Creatinine, Ser 1.41 (*)    GFR, Estimated 50 (*)    All other components within normal limits  CBC WITH DIFFERENTIAL/PLATELET - Abnormal; Notable for the following  components:   RBC 3.81 (*)    Hemoglobin 12.5 (*)    HCT 37.8 (*)    Platelets 145 (*)    Abs Immature Granulocytes 0.10 (*)    All other components within normal limits  CBC WITH DIFFERENTIAL/PLATELET  ETHANOL     EKG  ED ECG REPORT I, Dionne Bucy, the attending physician, personally viewed and interpreted this ECG.  Date: 03/11/2022 EKG Time: 0132 Rate: 56 Rhythm: normal sinus rhythm QRS Axis: normal Intervals: normal ST/T Wave abnormalities: normal Narrative Interpretation: no evidence of acute ischemia    RADIOLOGY  I independently viewed and interpreted the images for the following studies:  CT head: No ICH.    CT cervical spine: No acute fracture.  PROCEDURES:  Critical Care performed: No  ..Laceration Repair  Date/Time: 03/11/2022 1:46 AM  Performed by: Dionne Bucy, MD Authorized by: Dionne Bucy, MD   Consent:    Consent obtained:  Verbal   Consent given by:  Patient   Risks discussed:  Infection, pain, retained foreign body, poor cosmetic result and poor wound healing Universal protocol:    Patient identity confirmed:  Verbally with patient Anesthesia:    Anesthesia method:  Local infiltration   Local anesthetic:  Lidocaine 2% WITH epi Laceration details:    Location:  Scalp   Scalp location:  R parietal   Length (cm):  4   Depth (mm):  2 Exploration:    Hemostasis achieved with:  Direct pressure   Contaminated: no   Treatment:    Area cleansed with:  Povidone-iodine   Amount of cleaning:  Extensive   Visualized foreign bodies/material removed: no     Debridement:  None Skin repair:    Repair method:  Staples   Number of staples:  11 Approximation:    Approximation:  Close Repair type:    Repair type:  Simple Post-procedure details:    Dressing:  Open (no dressing)   Procedure completion:  Tolerated well, no immediate complications    MEDICATIONS ORDERED IN ED: Medications  lidocaine-EPINEPHrine  (XYLOCAINE W/EPI) 2 %-1:100000 (with pres) injection 20 mL (20 mLs Intradermal Given 03/11/22 0100)     IMPRESSION / MDM / ASSESSMENT AND PLAN / ED COURSE  I reviewed the triage vital signs and the nursing notes.  81 year old male with A-fib on Xarelto presents with a fall and head injury while he was intoxicated.  It does not appear that he got lightheaded and syncopized but does not clearly remember the fall due to intoxication.  On exam the patient has a right parietal scalp hematoma and laceration with no other trauma.  Neurologic exam is nonfocal.  I reviewed the past medical records.  The patient's most recent cardiology visit was with Dr. Beatrix Fetters on 7/12 and he is being managed for atrial fibrillation on Xarelto and metoprolol, hypertension, and hyperlipidemia.  Differential diagnosis includes, but is not limited to, minor head injury, concussion, intracranial hemorrhage.  I do not suspect that the patient syncopized.  We will obtain CT head and cervical spine due to the patient's age and intoxication, basic labs, ethanol level, EKG, and reassess.  Patient's presentation is most consistent with acute presentation with potential threat to life or bodily function.  ----------------------------------------- 1:45 AM on 03/11/2022 -----------------------------------------  CT head and C-spine show no acute findings.  EKG is unremarkable.  Lab work-up is reassuring with creatinine at baseline and no leukocytosis or other abnormalities.  The patient is clinically sober.  Laceration was repaired without difficulty.  The patient is stable for discharge back to his facility.  Return precautions given, and he expresses understanding.   FINAL CLINICAL IMPRESSION(S) / ED DIAGNOSES   Final diagnoses:  Fall, initial encounter  Laceration of scalp, initial encounter  Alcoholic intoxication with complication (HCC)  Minor head injury, initial encounter     Rx / DC Orders   ED Discharge Orders      None        Note:  This document was prepared using Dragon voice recognition software and may include unintentional dictation errors.    Dionne Bucy, MD 03/11/22 [phone]

## 2022-03-10 NOTE — ED Triage Notes (Signed)
Fell from standing, crawled into room. Medic reports etoh on board. Hematoma noted to right head. Patient reports pain 2/10

## 2022-03-11 MED ORDER — LIDOCAINE-EPINEPHRINE 2 %-1:100000 IJ SOLN
20.0000 mL | Freq: Once | INTRAMUSCULAR | Status: AC
Start: 2022-03-11 — End: 2022-03-11
  Administered 2022-03-11: 20 mL via INTRADERMAL
  Filled 2022-03-11: qty 1

## 2022-03-11 NOTE — Discharge Instructions (Addendum)
You should return in 1 week to have the staples removed.  You may shower and wash her hair although do so carefully around the wound.  Do not submerge your head underwater.  Return to the ER for new, worsening, or persistent severe headache, vision changes, vomiting, weakness or numbness, or any other new or worsening symptoms that concern you.

## 2022-04-22 ENCOUNTER — Encounter: Payer: Self-pay | Admitting: Emergency Medicine

## 2022-04-22 ENCOUNTER — Ambulatory Visit (INDEPENDENT_AMBULATORY_CARE_PROVIDER_SITE_OTHER): Payer: Medicare HMO | Admitting: Emergency Medicine

## 2022-04-22 VITALS — BP 130/70 | HR 88 | Temp 98.3°F | Ht 68.0 in | Wt 158.4 lb

## 2022-04-22 DIAGNOSIS — J439 Emphysema, unspecified: Secondary | ICD-10-CM

## 2022-04-22 NOTE — Assessment & Plan Note (Signed)
No flares.  Overall stable.  He does have intermittent cough, not very intrusive however.  Some exertional limitation.  Please continue Bevespi 2 puffs twice a day. Keep your albuterol available to use 2 puffs when you needed for shortness of breath, chest tightness, wheezing. Flu shot up-to-date Please follow Dr. Lamonte Sakai in 1 year or sooner if you have any problems.

## 2022-04-22 NOTE — Patient Instructions (Addendum)
Please continue Bevespi 2 puffs twice a day. Keep your albuterol available to use 2 puffs when you needed for shortness of breath, chest tightness, wheezing. Flu shot up-to-date Please follow Dr. Lamonte Sakai in 1 year or sooner if you have any problems.

## 2022-04-22 NOTE — Progress Notes (Signed)
Subjective:    Patient ID: Logan Morgan, male    DOB: Nov 08, 1940, 81 y.o.   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  He is referred today for evaluation of shortness of breath, COPD.  He is on Trelegy, unsure whether he benefits much.  Does not have albuterol SOB with bending over, has exertional SOB when he walks less than 50 ft. SOB when walking through his home. SOB w household chores. Can intermittently have some cough, started to be more regular since [place] in 12/22. Some phlegm - yellowish. Hears some wheeze.   Daily Nasal congestion, drainage, clear.   CT chest 10/15/2020 reviewed by me showed marked emphysematous changes especially apically, no nodules or infiltrates.  Most recent pulmonary function testing 02/2019 ([facility]): Severe obstruction with a positive bronchodilator response.  Normal lung volumes (question pseudonormalization).  Decreased diffusion capacity that does not correct when adjusted for alveolar volume   ROV 10/23/21 --follow-up visit for 81 year old gentleman with a history of tobacco use and associated emphysematous COPD.  I saw him in March for dyspnea, intermittent cough, COPD.  The chronic cough have been present since he had COVID-19 in 05/2021.  We did not make any intervention on this as it did not seem to be bothering him significantly.  He had been managed on Trelegy which we continued at least for now.  Walking oximetry did not show any exertional desaturation.  He underwent pulmonary function testing today. He tells me today that he has been doing about the same, he gets some SOB with carrying something up the stairs. Also happens w groceries, housework. He uses albuterol occasionally, not every day.   Pulmonary function testing performed today showed severe obstruction with an FEV1 of 1.38 L (53% predicted) without a bronchodilator response.  Lung  volumes are normal (question pseudonormalization).  His diffusion capacity is decreased and does not correct when adjusted for alveolar volume.  His flow-volume loop shows severe obstruction.  ROV 04/22/2022 --81 year old gentleman with a history of tobacco use, emphysema/COPD.  I have seen him for his COPD and also for chronic cough that began to be problematic when he had COVID-19 in 05/2021.  Pulmonary function testing with severe obstruction.  I changed him to [place] at our last visit in May.  Today he reports He is on bevespi. He is using albuterol about 1-2x a month. He is able to walk but will get SOB with longer distances. He has a chronic cough, occas prod. Some hoarseness. He had a fall since I last saw him, but no resp flares.    Review of Systems As per HPI  Past Medical History:  Diagnosis Date   Atrial fibrillation ([place])    COPD (chronic obstructive pulmonary disease) (HCC)    Dyspnea    Pt reports due to inactivity   Dysrhythmia    Emphysema lung ([place])    Prostate cancer ([place])      Family History  Problem Relation Age of Onset   COPD Mother      Social History   Socioeconomic History   Marital status: Married    Spouse name: Not on file   Number of children: Not on file   Years of education: Not on file   Highest education level: Not on file  Occupational History   Not on file  Tobacco Use   Smoking status: Former  Years: 38.00    Types: Cigarettes    Quit date: 82    Years since quitting: 25.8   Smokeless tobacco: Never  Vaping Use   Vaping Use: Never used  Substance and Sexual Activity   Alcohol use: Yes    Alcohol/week: 18.0 standard drinks of alcohol    Types: 18 Standard drinks or equivalent per week    Comment: 2-3 vodkas daily   Drug use: No   Sexual activity: Not on file  Other Topics Concern   Not on file  Social History Narrative   Not on file   Social Determinants of Health   Financial Resource Strain: Not on file  Food  Insecurity: Not on file  Transportation Needs: Not on file  Physical Activity: Not on file  Stress: Not on file  Social Connections: Not on file  Intimate Partner Violence: Not on file   Has lived in [place], [place], [place]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 8 g 6   amiodarone (PACERONE) 200 MG tablet Take 2 tablets (400 mg total) by mouth 2 (two) times daily. (Patient taking differently: Take 400 mg by mouth daily.) 60 tablet 0   atorvastatin (LIPITOR) 20 MG tablet Take 20 mg by mouth daily.     ferrous sulfate 325 (65 FE) MG tablet Take 325 mg by mouth daily.     furosemide (LASIX) 20 MG tablet Take 20 mg by mouth daily.     glucosamine-chondroitin 500-400 MG tablet Take 1 tablet by mouth 2 (two) times daily.      Glycopyrrolate-Formoterol (BEVESPI AEROSPHERE) 9-4.8 MCG/ACT AERO Inhale 2 puffs into the lungs 2 (two) times daily. 32.1 g 1   metoprolol succinate (TOPROL-XL) 25 MG 24 hr tablet Take 25 mg by mouth daily.     Multiple Vitamin (ONE-A-DAY MENS PO) Take 1 tablet by mouth daily.     olmesartan (BENICAR) 40 MG tablet Take by mouth.     Omega-3 Fatty Acids (FISH OIL) 1000 MG CAPS Take by mouth daily.     potassium chloride SA (K-DUR,KLOR-CON) 20 MEQ tablet Take 1 tablet (20 mEq total) by mouth daily. (Patient taking differently: Take 20 mEq by mouth 2 (two) times daily.) 30 tablet 6   pravastatin (PRAVACHOL) 40 MG tablet Take 40 mg by mouth daily.     rivaroxaban (XARELTO) 20 MG TABS tablet Take 20 mg by mouth daily with supper.     sertraline (ZOLOFT) 50 MG tablet Take 1 tablet by mouth daily.     tamsulosin (FLOMAX) 0.4 MG CAPS capsule Take 0.4 mg by mouth daily.     No facility-administered medications prior to visit.         Objective:    Physical Exam  Vitals:   04/22/22 1358  BP: 130/70  Pulse: 88  Temp: 98.3 F (36.8 C)  TempSrc: Oral  SpO2: 90%  Weight: 158 lb 6.4 oz (71.8 kg)  Height: '5\' 8"'$  (1.727 m)    Gen: Pleasant, well-nourished, in no distress,  normal affect  ENT: No lesions,  mouth clear,  oropharynx clear, no postnasal drip  Neck: No JVD, no stridor  Lungs: No use of accessory muscles, distant, no wheeze on a normal breath, he does wheeze bilaterally on forced expiration  Cardiovascular: RRR, heart sounds normal, no murmur or gallops, no peripheral edema  Musculoskeletal: No deformities, no cyanosis or clubbing  Neuro: alert, awake, non focal  Skin: Warm, no lesions or rash    Assessment & Plan:   Chronic obstructive pulmonary emphysema No flares.  Overall stable.  He does have intermittent cough, not very intrusive however.  Some exertional limitation.  Please continue Bevespi 2 puffs twice a day. Keep your albuterol available to use 2 puffs when you needed for shortness of breath, chest tightness, wheezing. Flu shot up-to-date Please follow Dr. Lamonte Sakai in 1 year or sooner if you have any problems.   Baltazar Apo, MD, PhD 04/22/2022, 2:18 PM [facility] [phone] or if no answer before 7:00PM call [phone] For any issues after 7:00PM please call eLink [phone]

## 2022-07-06 ENCOUNTER — Ambulatory Visit: Payer: Medicare HMO | Admitting: Student

## 2022-07-06 ENCOUNTER — Encounter: Payer: Self-pay | Admitting: Student

## 2022-07-06 VITALS — BP 132/74 | HR 76 | Temp 98.5°F | Ht 68.0 in | Wt 160.0 lb

## 2022-07-06 DIAGNOSIS — R053 Chronic cough: Secondary | ICD-10-CM

## 2022-07-06 DIAGNOSIS — J432 Centrilobular emphysema: Secondary | ICD-10-CM

## 2022-07-06 NOTE — Progress Notes (Signed)
Location:  [facility] [place]   Place of Service:   [facility] [place] [facility]  Provider: Unk Lightning  Code Status: Full Code Goals of Care:     03/10/2022   11:44 PM  Advanced Directives  Does Patient Have a Medical Advance Directive? No  Would patient like information on creating a medical advance directive? No - Patient declined     No chief complaint on file.   HPI: Patient is a 82 y.o. male seen today for an acute visit for cold-like symptoms. He has a bit of a hoarse voice. It's the worse in the morning. His cough and respiration is bad. By mid day it's better. He started having a cough several weeks ago. More frequent and was getting phlegm and a day or so it was better. Coughing up grey sputum this morning. He has been using the albuterol more often and the coughing has impacting his urinary continence.   He has emphysema. He has concern that his lung disease is progressing.   Doesn't exercise because he doesn't have lung capacity. His wife is in memory care. They have a son in [place], [place].   He takes [employer] and albuterol periodically.   Past Medical History:  Diagnosis Date   Atrial fibrillation ([place])    COPD (chronic obstructive pulmonary disease) ([place])    Dyspnea    Pt reports due to inactivity   Dysrhythmia    Emphysema lung (HCC)    Prostate cancer [facility])     Past Surgical History:  Procedure Laterality Date   CATARACT EXTRACTION W/PHACO Left 09/15/2021   Procedure: CATARACT EXTRACTION PHACO AND INTRAOCULAR LENS PLACEMENT (IOC) LEFT 12.74 01:04.8;  Surgeon: Eulogio Bear, MD;  Location: [place];  Service: Ophthalmology;  Laterality: Left;   CATARACT EXTRACTION W/PHACO Right 09/29/2021   Procedure: CATARACT EXTRACTION PHACO AND INTRAOCULAR LENS PLACEMENT (IOC) RIGHT;  Surgeon: Eulogio Bear, MD;  Location: [place];  Service: Ophthalmology;  Laterality: Right;  14.81 01:15.8   COLONOSCOPY     COLONOSCOPY WITH PROPOFOL N/A 10/28/2021    Procedure: COLONOSCOPY WITH PROPOFOL;  Surgeon: Lucilla Lame, MD;  Location[facility] ENDOSCOPY;  Service: Endoscopy;  Laterality: N/A;   ESOPHAGOGASTRODUODENOSCOPY N/A 10/28/2021   Procedure: ESOPHAGOGASTRODUODENOSCOPY (EGD);  Surgeon: Lucilla Lame, MD;  Location[facility] ENDOSCOPY;  Service: Endoscopy;  Laterality: N/A;   EYE SURGERY     GIVENS CAPSULE STUDY N/A 11/24/2021   Procedure: GIVENS CAPSULE STUDY;  Surgeon: Lucilla Lame, MD;  Location: [facility] ENDOSCOPY;  Service: Endoscopy;  Laterality: N/A;   HAND SURGERY     x3. as child   RADIOACTIVE SEED IMPLANT     TONSILLECTOMY     as child    No Known Allergies  Outpatient Encounter Medications as of 07/06/2022  Medication Sig   albuterol (VENTOLIN HFA) 108 (90 Base) MCG/ACT inhaler Inhale 2 puffs into the lungs every 6 (six) hours as needed for wheezing or shortness of breath.   amiodarone (PACERONE) 200 MG tablet Take 2 tablets (400 mg total) by mouth 2 (two) times daily. (Patient taking differently: Take 400 mg by mouth daily.)   atorvastatin (LIPITOR) 20 MG tablet Take 20 mg by mouth daily.   ferrous sulfate 325 (65 FE) MG tablet Take 325 mg by mouth daily.   furosemide (LASIX) 20 MG tablet Take 20 mg by mouth daily.   glucosamine-chondroitin 500-400 MG tablet Take 1 tablet by mouth 2 (two) times daily.    Glycopyrrolate-Formoterol (BEVESPI AEROSPHERE) 9-4.8 MCG/ACT AERO Inhale  2 puffs into the lungs 2 (two) times daily.   metoprolol succinate (TOPROL-XL) 25 MG 24 hr tablet Take 25 mg by mouth daily.   Multiple Vitamin (ONE-A-DAY MENS PO) Take 1 tablet by mouth daily.   olmesartan (BENICAR) 40 MG tablet Take by mouth.   Omega-3 Fatty Acids (FISH OIL) 1000 MG CAPS Take by mouth daily.   potassium chloride SA (K-DUR,KLOR-CON) 20 MEQ tablet Take 1 tablet (20 mEq total) by mouth daily. (Patient taking differently: Take 20 mEq by mouth 2 (two) times daily.)   pravastatin (PRAVACHOL) 40 MG tablet Take 40 mg by mouth daily.   rivaroxaban (XARELTO)  20 MG TABS tablet Take 20 mg by mouth daily with supper.   sertraline (ZOLOFT) 50 MG tablet Take 1 tablet by mouth daily.   tamsulosin (FLOMAX) 0.4 MG CAPS capsule Take 0.4 mg by mouth daily.   No facility-administered encounter medications on file as of 07/06/2022.    Review of Systems:  Review of Systems  All other systems reviewed and are negative.   Health Maintenance  Topic Date Due   Medicare Annual Wellness (AWV)  Never done   Zoster Vaccines- Shingrix (1 of 2) Never done   COVID-19 Vaccine (3 - Moderna risk series) 08/17/2019   DTaP/Tdap/Td (3 - Td or Tdap) 12/17/2029   Pneumonia Vaccine 88+ Years old  Completed   INFLUENZA VACCINE  Completed   HPV VACCINES  Aged Out    Physical Exam: There were no vitals filed for this visit. There is no height or weight on file to calculate BMI. Physical Exam Vitals reviewed.  Cardiovascular:     Rate and Rhythm: Normal rate.  Pulmonary:     Effort: Pulmonary effort is normal.     Comments: Decreased breath sounds Neurological:     Mental Status: He is alert.     Labs reviewed: Basic Metabolic Panel: Recent Labs    03/10/22 2327  NA 136  K 3.7  CL 104  CO2 24  GLUCOSE 86  BUN 27*  CREATININE 1.41*  CALCIUM 9.2   Liver Function Tests: No results for input(s): "AST", "ALT", "ALKPHOS", "BILITOT", "PROT", "ALBUMIN" in the last 8760 hours. No results for input(s): "LIPASE", "AMYLASE" in the last 8760 hours. No results for input(s): "AMMONIA" in the last 8760 hours. CBC: Recent Labs    03/10/22 2327  WBC 6.3  NEUTROABS 3.5  HGB 12.5*  HCT 37.8*  MCV 99.2  PLT 145*   Lipid Panel: No results for input(s): "CHOL", "HDL", "LDLCALC", "TRIG", "CHOLHDL", "LDLDIRECT" in the last 8760 hours. No results found for: "HGBA1C"  Procedures since last visit: No results found.  Assessment/Plan Centrilobular emphysema (HCC)  Chronic cough Patient with acute on chronic cough. Lungs with decresaed breath sounds but no  rhonchi, wheezes, or rales. Patient declines CXR at this time. Plan for RVP. Recommended RSV vaccination. If patient is negative for all RVP, recommend follow up with pulmonologist.    Labs/tests ordered:  * No order type specified * Next appt:  Visit date not found

## 2022-07-11 ENCOUNTER — Telehealth: Payer: Self-pay | Admitting: Student

## 2022-07-11 NOTE — Telephone Encounter (Signed)
Please notify patient his viral panel was negative. Encourage he follow up with his pulmonologist as discussed in his appointment.

## 2022-07-11 NOTE — Telephone Encounter (Signed)
-----   Message from Veneda Melter, [place] sent at 07/10/2022  9:02 AM EST ----- Patient quest results

## 2022-07-13 NOTE — Telephone Encounter (Signed)
LMOM for patient to return call.

## 2022-07-14 NOTE — Telephone Encounter (Signed)
Patient notified and agreed.  

## 2022-11-12 ENCOUNTER — Telehealth: Payer: Self-pay | Admitting: Emergency Medicine

## 2022-11-12 ENCOUNTER — Other Ambulatory Visit: Payer: Self-pay

## 2022-11-12 MED ORDER — BEVESPI AEROSPHERE 9-4.8 MCG/ACT IN AERO: 2.0000 | INHALATION_SPRAY | Freq: Two times a day (BID) | RESPIRATORY_TRACT | 3 refills | Status: AC

## 2022-11-12 NOTE — Telephone Encounter (Signed)
Refill of Bevespi has been sent to pharmacy. Patient is aware. NFN

## 2022-11-12 NOTE — Telephone Encounter (Signed)
Refill of Bevespi has been sent to pharmacy. Patient is aware. NFN 

## 2022-11-12 NOTE — Telephone Encounter (Signed)
Patient called to request a refill for his inhaler, Glycopyrrolate-Formoterol (BEVESPI AEROSPHERE) 9-4.8 MCG/ACT AERO .  Please advise and call patient if there are any questions.  CB# [phone]

## 2023-04-22 ENCOUNTER — Ambulatory Visit (INDEPENDENT_AMBULATORY_CARE_PROVIDER_SITE_OTHER): Payer: Medicare HMO | Admitting: Emergency Medicine

## 2023-04-22 ENCOUNTER — Encounter: Payer: Self-pay | Admitting: Emergency Medicine

## 2023-04-22 VITALS — BP 134/74 | HR 75 | Temp 97.9°F | Ht 68.0 in | Wt 160.4 lb

## 2023-04-22 DIAGNOSIS — R053 Chronic cough: Secondary | ICD-10-CM | POA: Diagnosis not present

## 2023-04-22 DIAGNOSIS — I4891 Unspecified atrial fibrillation: Secondary | ICD-10-CM | POA: Diagnosis not present

## 2023-04-22 DIAGNOSIS — I2729 Other secondary pulmonary hypertension: Secondary | ICD-10-CM | POA: Diagnosis not present

## 2023-04-22 DIAGNOSIS — J432 Centrilobular emphysema: Secondary | ICD-10-CM

## 2023-04-22 NOTE — Patient Instructions (Signed)
We will perform walking oximetry on room air today We will form an overnight oximetry on room air We will repeat your pulmonary function testing to compare with priors We will perform a CT scan of the chest without contrast to compare with priors Please continue your Bevespi 2 puffs twice a day. Keep your albuterol available to use 2 puffs when needed for shortness of breath, chest tightness, wheezing. Please follow Dr. Delton Coombes next available after your testing so we can review those results together.

## 2023-04-22 NOTE — Progress Notes (Signed)
Subjective:    Patient ID: Logan Morgan, male    DOB: [birth year], 82 y.o.   MRN: [id number]  HPI  ROV 04/22/2022 --82 year old gentleman with a history of tobacco use, emphysema/COPD.  I have seen him for his COPD and also for chronic cough that began to be problematic when he had COVID-19 in 05/2021.  Pulmonary function testing with severe obstruction.  I changed him to Stiolto at our last visit in May.  Today he reports He is on bevespi. He is using albuterol about 1-2x a month. He is able to walk but will get SOB with longer distances. He has a chronic cough, occas prod. Some hoarseness. He had a fall since I last saw him, but no resp flares.   ROV 04/22/2023 --follow-up visit for 82 year old gentleman with a history of emphysematous COPD and associated chronic cough that began and 05/2021 after he had COVID-19.  Last seen about 1 year ago.  Have been managing him on Bevespi Today he reports increased exertional shortness of breath.  Can happen even with short walks.  Occasional cough, worse over the last several weeks minimally productive.  He is on amiodarone for his atrial fibrillation, had a reassuring CT chest in May 2022.  Last PFT were May 2023 as below   Pulmonary function testing 10/23/2021 reviewed by me showed severe obstruction without a bronchodilator response, normal lung volumes suggestive of coexisting restriction, decreased diffusion capacity.   Review of Systems As per HPI  Past Medical History:  Diagnosis Date   Atrial fibrillation (HCC)    COPD (chronic obstructive pulmonary disease) (HCC)    Dyspnea    Pt reports due to inactivity   Dysrhythmia    Emphysema lung (HCC)    Prostate cancer (HCC)      Family History  Problem Relation Age of Onset   COPD Mother      Social History   Socioeconomic History   Marital status: Married    Spouse name: Not on file   Number of children: Not on file   Years of education: Not on file   Highest education level: Not  on file  Occupational History   Not on file  Tobacco Use   Smoking status: Former    Current packs/day: 0.00    Types: Cigarettes    Start date: 55    Quit date: 1998    Years since quitting: 26.8   Smokeless tobacco: Never  Vaping Use   Vaping status: Never Used  Substance and Sexual Activity   Alcohol use: Yes    Alcohol/week: 18.0 standard drinks of alcohol    Types: 18 Standard drinks or equivalent per week    Comment: 2-3 vodkas daily   Drug use: No   Sexual activity: Not on file  Other Topics Concern   Not on file  Social History Narrative   Not on file   Social Determinants of Health   Financial Resource Strain: Low Risk  (04/19/2023)   Received from [facility]   Overall Financial Resource Strain (CARDIA)    Difficulty of Paying Living Expenses: Not hard at all  Food Insecurity: No Food Insecurity (04/19/2023)   Received from [facility]   Hunger Vital Sign    Worried About Running Out of Food in the Last Year: Never true    Ran Out of Food in the Last Year: Never true  Transportation Needs: No Transportation Needs (04/19/2023)   Received from [facility]   PRAPARE - Transportation    In the past 12 months, has lack of transportation kept you from medical appointments or from getting medications?: No    Lack of Transportation (Non-Medical): No  Physical Activity: Not on file  Stress: Not on file  Social Connections: Not on file  Intimate Partner Violence: Not on file   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 8 g 6   amiodarone (PACERONE) 200 MG tablet Take 200 mg by mouth daily.     atorvastatin (LIPITOR) 20 MG tablet Take 20 mg by mouth  daily.     ferrous sulfate 325 (65 FE) MG tablet Take 325 mg by mouth daily.     furosemide (LASIX) 20 MG tablet Take 20 mg by mouth daily.     glucosamine-chondroitin 500-400 MG tablet Take 1 tablet by mouth 2 (two) times daily.      Glycopyrrolate-Formoterol (BEVESPI AEROSPHERE) 9-4.8 MCG/ACT AERO Inhale 2 puffs into the lungs 2 (two) times daily. 32.1 g 3   metoprolol succinate (TOPROL-XL) 25 MG 24 hr tablet Take 25 mg by mouth daily.     Multiple Vitamin (ONE-A-DAY MENS PO) Take 1 tablet by mouth daily.     olmesartan (BENICAR) 40 MG tablet Take by mouth.     Omega-3 Fatty Acids (FISH OIL) 1000 MG CAPS Take by mouth daily.     potassium chloride SA (K-DUR,KLOR-CON) 20 MEQ tablet Take 1 tablet (20 mEq total) by mouth daily. (Patient taking differently: Take 20 mEq by mouth 2 (two) times daily.) 30 tablet 6   pravastatin (PRAVACHOL) 40 MG tablet Take 40 mg by mouth daily.     rivaroxaban (XARELTO) 20 MG TABS tablet Take 20 mg by mouth daily with supper.     sertraline (ZOLOFT) 100 MG tablet Take 150 mg by mouth daily.     tamsulosin (FLOMAX) 0.4 MG CAPS capsule Take 0.4 mg by mouth daily.     No facility-administered medications prior to visit.         Objective:   Physical Exam  Vitals:   04/22/23 1107  BP: 134/74  Pulse: 75  Temp: 97.9 F (36.6 C)  TempSrc: Oral  SpO2: 93%  Weight: 160 lb 6.4 oz (72.8 kg)  Height: 5\' 8"  (1.727 m)    Gen: Pleasant, well-nourished, in no distress,  normal affect  ENT: No lesions,  mouth clear,  oropharynx clear, no postnasal drip  Neck: No JVD, no stridor  Lungs: No use of accessory muscles, distant, no wheeze on a normal breath, he does wheeze bilaterally on forced expiration  Cardiovascular: RRR, heart sounds normal, no murmur or gallops, no peripheral edema  Musculoskeletal: No deformities, no cyanosis or clubbing  Neuro: alert, awake, non focal  Skin: Warm, no lesions or rash    Assessment & Plan:   A-fib On amiodarone.   Given his progressive dyspnea we will repeat his pulmonary function testing to follow for stability.  Chest x-ray has been reassuring but I will repeat CT chest to compare with 2022 to ensure no evidence of amiodarone change  Centrilobular emphysema (HCC) With progressive dyspnea and severe obstruction on his pulmonary function testing.  Currently managed on Bevespi.  No flares,  no mucus burden so I believe we can continue this, avoid ICS for now.  Given his progressive shortness of breath, evidence for secondary PAH and his degree of obstruction believe we should perform walking oximetry, overnight oximetry.  We will perform walking oximetry on room air today We will form an overnight oximetry on room air Please continue your Bevespi 2 puffs twice a day. Keep your albuterol available to use 2 puffs when needed for shortness of breath, chest tightness, wheezing. Please follow Dr. Delton Coombes next available after your testing so we can review those results together.  Chronic cough Still bothersome but overall stable.  Plan to continue current regimen  Other secondary pulmonary hypertension (HCC) Suspect a large component of this is his emphysema and COPD.  He does have atrial fibrillation which could be a contributor.  Need to rule out daytime and nighttime hypoxemia.  He might merit formal sleep study going forward.  We will start with an overnight oximetry    Levy Pupa, MD, PhD 04/23/2023, 11:50 AM [facility] [phone] or if no answer before 7:00PM call [phone] For any issues after 7:00PM please call eLink [phone]

## 2023-04-23 DIAGNOSIS — I2729 Other secondary pulmonary hypertension: Secondary | ICD-10-CM | POA: Insufficient documentation

## 2023-04-23 NOTE — Assessment & Plan Note (Signed)
With progressive dyspnea and severe obstruction on his pulmonary function testing.  Currently managed on Bevespi.  No flares, no mucus burden so I believe we can continue this, avoid ICS for now.  Given his progressive shortness of breath, evidence for secondary PAH and his degree of obstruction believe we should perform walking oximetry, overnight oximetry.  We will perform walking oximetry on room air today We will form an overnight oximetry on room air Please continue your Bevespi 2 puffs twice a day. Keep your albuterol available to use 2 puffs when needed for shortness of breath, chest tightness, wheezing. Please follow Dr. Delton Coombes next available after your testing so we can review those results together.

## 2023-04-23 NOTE — Assessment & Plan Note (Signed)
Still bothersome but overall stable.  Plan to continue current regimen

## 2023-04-23 NOTE — Assessment & Plan Note (Signed)
Suspect a large component of this is his emphysema and COPD.  He does have atrial fibrillation which could be a contributor.  Need to rule out daytime and nighttime hypoxemia.  He might merit formal sleep study going forward.  We will start with an overnight oximetry

## 2023-04-23 NOTE — Assessment & Plan Note (Signed)
On amiodarone.  Given his progressive dyspnea we will repeat his pulmonary function testing to follow for stability.  Chest x-ray has been reassuring but I will repeat CT chest to compare with 2022 to ensure no evidence of amiodarone change

## 2023-04-28 ENCOUNTER — Other Ambulatory Visit: Payer: Medicare HMO

## 2023-04-28 ENCOUNTER — Ambulatory Visit
Admission: RE | Admit: 2023-04-28 | Discharge: 2023-04-28 | Disposition: A | Discharge status: Home / Self Care | Payer: Medicare HMO | Source: Ambulatory Visit | Attending: Emergency Medicine | Admitting: Emergency Medicine

## 2023-04-28 DIAGNOSIS — J432 Centrilobular emphysema: Secondary | ICD-10-CM

## 2023-05-05 ENCOUNTER — Other Ambulatory Visit: Payer: Medicare HMO

## 2023-05-31 ENCOUNTER — Encounter: Payer: Self-pay | Admitting: Urology

## 2023-06-07 ENCOUNTER — Telehealth: Payer: Self-pay | Admitting: Urology

## 2023-06-07 DIAGNOSIS — N2889 Other specified disorders of kidney and ureter: Secondary | ICD-10-CM

## 2023-06-07 NOTE — Telephone Encounter (Signed)
Order placed. Patient already scheduled for follow up on the scan

## 2023-06-07 NOTE — Telephone Encounter (Signed)
Patient called regarding scheduling MRI of the abdomen. Order expired, and will need new order.

## 2023-06-15 ENCOUNTER — Ambulatory Visit
Admission: RE | Admit: 2023-06-15 | Discharge: 2023-06-15 | Disposition: A | Discharge status: Home / Self Care | Payer: Medicare HMO | Source: Ambulatory Visit | Attending: Urology | Admitting: Urology

## 2023-06-15 DIAGNOSIS — N2889 Other specified disorders of kidney and ureter: Secondary | ICD-10-CM | POA: Diagnosis present

## 2023-06-15 MED ORDER — GADOBUTROL 1 MMOL/ML IV SOLN
7.0000 mL | Freq: Once | INTRAVENOUS | Status: AC | PRN
  Administered 2023-06-15: 7 mL via INTRAVENOUS

## 2023-07-06 ENCOUNTER — Ambulatory Visit: Payer: Medicare HMO | Admitting: Emergency Medicine

## 2023-07-06 DIAGNOSIS — J432 Centrilobular emphysema: Secondary | ICD-10-CM | POA: Diagnosis not present

## 2023-07-06 LAB — PULMONARY FUNCTION TEST
DL/VA % pred: 41 %
DL/VA: 1.61 ml/min/mmHg/L
DLCO cor % pred: 37 %
DLCO cor: 8.44 ml/min/mmHg
DLCO unc % pred: 37 %
DLCO unc: 8.44 ml/min/mmHg
FEF 25-75 Post: 0.59 L/s
FEF 25-75 Pre: 0.5 L/s
FEF2575-%Change-Post: 16 %
FEF2575-%Pred-Post: 34 %
FEF2575-%Pred-Pre: 29 %
FEV1-%Change-Post: 6 %
FEV1-%Pred-Post: 49 %
FEV1-%Pred-Pre: 46 %
FEV1-Post: 1.26 L
FEV1-Pre: 1.19 L
FEV1FVC-%Change-Post: -2 %
FEV1FVC-%Pred-Pre: 68 %
FEV6-%Change-Post: 3 %
FEV6-%Pred-Post: 76 %
FEV6-%Pred-Pre: 73 %
FEV6-Post: 2.55 L
FEV6-Pre: 2.45 L
FEV6FVC-%Change-Post: -4 %
FEV6FVC-%Pred-Post: 102 %
FEV6FVC-%Pred-Pre: 107 %
FVC-%Change-Post: 9 %
FVC-%Pred-Post: 74 %
FVC-%Pred-Pre: 67 %
FVC-Post: 2.68 L
FVC-Pre: 2.45 L
Post FEV1/FVC ratio: 47 %
Post FEV6/FVC ratio: 95 %
Pre FEV1/FVC ratio: 49 %
Pre FEV6/FVC Ratio: 100 %
RV % pred: 134 %
RV: 3.47 L
TLC % pred: 115 %
TLC: 7.69 L

## 2023-07-06 MED ORDER — ALBUTEROL SULFATE HFA 108 (90 BASE) MCG/ACT IN AERS: 2.0000 | INHALATION_SPRAY | Freq: Four times a day (QID) | RESPIRATORY_TRACT | 2 refills | Status: AC | PRN

## 2023-07-06 NOTE — Progress Notes (Signed)
Full PFT performed today. Albuterol refilled.

## 2023-07-06 NOTE — Patient Instructions (Signed)
Full PFT performed today.

## 2023-07-07 ENCOUNTER — Encounter: Payer: Self-pay | Admitting: Emergency Medicine

## 2023-07-07 ENCOUNTER — Ambulatory Visit: Payer: Medicare HMO | Admitting: Emergency Medicine

## 2023-07-07 VITALS — BP 122/62 | HR 63 | Temp 98.1°F | Ht 68.0 in | Wt 162.8 lb

## 2023-07-07 DIAGNOSIS — J432 Centrilobular emphysema: Secondary | ICD-10-CM

## 2023-07-07 DIAGNOSIS — J9611 Chronic respiratory failure with hypoxia: Secondary | ICD-10-CM | POA: Insufficient documentation

## 2023-07-07 DIAGNOSIS — I4891 Unspecified atrial fibrillation: Secondary | ICD-10-CM | POA: Diagnosis not present

## 2023-07-07 DIAGNOSIS — R0600 Dyspnea, unspecified: Secondary | ICD-10-CM

## 2023-07-07 NOTE — Patient Instructions (Signed)
We reviewed your pulmonary function testing and your CT scan of the chest.  There is no evidence to suggest that you have changes associated with amiodarone.  This is good news.  We will continue to surveillance. We talked about your oxygen testing today.  You qualify for nocturnal oxygen and we will start this. You also qualify for oxygen to use when you exert yourself.  We talked about possibly starting oxygen.  Please continue to think about this and let us know if you decide you want to do so. Continue your Breztri 2 puffs twice a day. Keep albuterol available to use 2 puffs when needed for shortness of breath, chest tightness, wheezing. Keep your flu shot up-to-date in the fall Follow with APP in 6 months, sooner if you have any problems. Follow Dr. Delton Coombes in 12 months, sooner if you have any problems

## 2023-07-07 NOTE — Progress Notes (Signed)
Subjective:    Patient ID: Logan Morgan, male    DOB: [birth year], 83 y.o.   MRN: [id number]  HPI  ROV 07/07/2023 --Shafiq returns today for follow-up.  He is 15 with a history of emphysematous COPD, chronic cough, atrial fibrillation on amiodarone.  He had COVID-19 in 05/2021.  The cough has been worse ever since.  He also has exertional shortness of breath.  He underwent ambulatory oximetry on 04/22/2023 that did show desaturation but he didn;'t want O2. He has checked his SpO2 and sees 80's%. Has dry cough daily, no sputum production. He feels that his exertioanl tolerance is a bit worse, more SOB. Has has rhinitis, not on any meds. Uses albuterol most days, does help him.   CT scan of the chest 04/28/2023 reviewed by me shows no evidence of interstitial lung disease or amiodarone change.  He does have advanced emphysema.  Also showed a complex right upper pole renal lesion that is being followed with MR  Pulmonary function testing 07/06/2023 reviewed by me shows very severe obstruction without a bronchodilator response, borderline hyperinflation, decreased diffusion capacity.  His FEV1 is decreased to 1.19 L from 1.38 L compared with 1 year ago.  Total lung capacity is actually improved.  Review of Systems As per HPI  Past Medical History:  Diagnosis Date   Atrial fibrillation (HCC)    COPD (chronic obstructive pulmonary disease) (HCC)    Dyspnea    Pt reports due to inactivity   Dysrhythmia    Emphysema lung (HCC)    Prostate cancer (HCC)      Family History  Problem Relation Age of Onset   COPD Mother      Social History   Socioeconomic History   Marital status: Married    Spouse name: Not on file   Number of children: Not on file   Years of education: Not on file   Highest education level: Not on file  Occupational History   Not on file  Tobacco Use   Smoking status: Former    Current packs/day: 0.00    Types: Cigarettes    Start date: 26    Quit date: 1998     Years since quitting: 27.0   Smokeless tobacco: Never  Vaping Use   Vaping status: Never Used  Substance and Sexual Activity   Alcohol use: Yes    Alcohol/week: 18.0 standard drinks of alcohol    Types: 18 Standard drinks or equivalent per week    Comment: 2-3 vodkas daily   Drug use: No   Sexual activity: Not on file  Other Topics Concern   Not on file  Social History Narrative   Not on file   Social Drivers of Health   Financial Resource Strain: Low Risk  (04/19/2023)   Received from [facility]   Overall Financial Resource Strain (CARDIA)    Difficulty of Paying Living Expenses: Not hard at all  Food Insecurity: No Food Insecurity (04/19/2023)   Received from [facility]   Hunger Vital Sign    Worried About Running Out of Food in the Last Year: Never true    Ran Out of Food in the Last Year: Never true  Transportation Needs: No Transportation Needs (04/19/2023)   Received from [facility]    In the past 12 months, has lack of transportation kept you from medical appointments or from getting medications?: No    Lack of Transportation (  Non-Medical): No  Physical Activity: Not on file  Stress: Not on file  Social Connections: Not on file  Intimate Partner Violence: Not on file   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 DX: J43.2 24 g 2   amiodarone (PACERONE) 200 MG tablet Take 200 mg by mouth daily.     atorvastatin (LIPITOR) 20 MG tablet Take 20 mg by mouth daily.     ferrous sulfate 325 (65 FE) MG tablet Take 325 mg by mouth daily.     furosemide (LASIX) 20 MG tablet Take 20 mg by mouth daily.     glucosamine-chondroitin 500-400  MG tablet Take 1 tablet by mouth 2 (two) times daily.      Glycopyrrolate-Formoterol (BEVESPI AEROSPHERE) 9-4.8 MCG/ACT AERO Inhale 2 puffs into the lungs 2 (two) times daily. 32.1 g 3   metoprolol succinate (TOPROL-XL) 25 MG 24 hr tablet Take 25 mg by mouth daily.     Multiple Vitamin (ONE-A-DAY MENS PO) Take 1 tablet by mouth daily.     olmesartan (BENICAR) 40 MG tablet Take by mouth.     Omega-3 Fatty Acids (FISH OIL) 1000 MG CAPS Take by mouth daily.     potassium chloride SA (K-DUR,KLOR-CON) 20 MEQ tablet Take 1 tablet (20 mEq total) by mouth daily. (Patient taking differently: Take 20 mEq by mouth 2 (two) times daily.) 30 tablet 6   pravastatin (PRAVACHOL) 40 MG tablet Take 40 mg by mouth daily.     rivaroxaban (XARELTO) 20 MG TABS tablet Take 20 mg by mouth daily with supper.     sertraline (ZOLOFT) 100 MG tablet Take 150 mg by mouth daily.     tamsulosin (FLOMAX) 0.4 MG CAPS capsule Take 0.4 mg by mouth daily.     No facility-administered medications prior to visit.         Objective:   Physical Exam  Vitals:   07/07/23 1108  BP: 122/62  Pulse: 63  Temp: 98.1 F (36.7 C)  TempSrc: Temporal  SpO2: 92%  Weight: 162 lb 12.8 oz (73.8 kg)  Height: 5\' 8"  (1.727 m)    Gen: Pleasant, well-nourished, in no distress,  normal affect  ENT: No lesions,  mouth clear,  oropharynx clear, no postnasal drip  Neck: No JVD, no stridor  Lungs: No use of accessory muscles, distant, no wheeze on a normal breath, he does wheeze bilaterally on forced expiration  Cardiovascular: RRR, heart sounds normal, no murmur or gallops, no peripheral edema  Musculoskeletal: No deformities, no cyanosis or clubbing  Neuro: alert, awake, non focal  Skin: Warm, no lesions or rash    Assessment & Plan:   A-fib We reviewed your pulmonary function testing and your CT scan of the chest.  There is no evidence to suggest that you have changes associated with amiodarone.  This is good news.  We will  continue to surveillance.  Chronic hypoxic respiratory failure (HCC) Confirmed on walking oximetry and now also on overnight oximetry.  Has been resistant to starting oxygen.  I suspect that the principal causes severe emphysematous COPD.  He does not have any ILD on CT chest.  Talk to him about the pros and cons of oxygen again today.  He  is willing to go ahead and start nocturnal oxygen and I will start 2 L/min.  He still is not ready to start oxygen with exertion.  Based on his own observations of his pulse oximetry, suspect he may need to be wearing oxygen almost all the time given the number of desaturations.  We talked about your oxygen testing today.  You qualify for nocturnal oxygen and we will start this. You also qualify for oxygen to use when you exert yourself.  We talked about possibly starting oxygen.  Please continue to think about this and let us know if you decide you want to do so. Follow with APP in 6 months, sooner if you have any problems. Follow Dr. Delton Coombes in 12 months, sooner if you have any problems  Centrilobular emphysema (HCC) With severe COPD.  He does have dry cough, no sputum burden.   Continue your Breztri 2 puffs twice a day. Keep albuterol available to use 2 puffs when needed for shortness of breath, chest tightness, wheezing. Keep your flu shot up-to-date in the fall     Levy Pupa, MD, PhD 07/07/2023, 12:58 PM [facility] [phone] or if no answer before 7:00PM call [phone] For any issues after 7:00PM please call eLink [phone]

## 2023-07-07 NOTE — Assessment & Plan Note (Signed)
We reviewed your pulmonary function testing and your CT scan of the chest.  There is no evidence to suggest that you have changes associated with amiodarone.  This is good news.  We will continue to surveillance.

## 2023-07-07 NOTE — Assessment & Plan Note (Signed)
With severe COPD.  He does have dry cough, no sputum burden.   Continue your Breztri 2 puffs twice a day. Keep albuterol available to use 2 puffs when needed for shortness of breath, chest tightness, wheezing. Keep your flu shot up-to-date in the fall

## 2023-07-07 NOTE — Assessment & Plan Note (Signed)
Confirmed on walking oximetry and now also on overnight oximetry.  Has been resistant to starting oxygen.  I suspect that the principal causes severe emphysematous COPD.  He does not have any ILD on CT chest.  Talk to him about the pros and cons of oxygen again today.  He is willing to go ahead and start nocturnal oxygen and I will start 2 L/min.  He still is not ready to start oxygen with exertion.  Based on his own observations of his pulse oximetry, suspect he may need to be wearing oxygen almost all the time given the number of desaturations.  We talked about your oxygen testing today.  You qualify for nocturnal oxygen and we will start this. You also qualify for oxygen to use when you exert yourself.  We talked about possibly starting oxygen.  Please continue to think about this and let us know if you decide you want to do so. Follow with APP in 6 months, sooner if you have any problems. Follow Dr. Delton Coombes in 12 months, sooner if you have any problems

## 2023-07-27 ENCOUNTER — Ambulatory Visit: Payer: Medicare HMO | Admitting: Urology

## 2023-07-27 VITALS — BP 105/62 | HR 83

## 2023-07-27 DIAGNOSIS — N2889 Other specified disorders of kidney and ureter: Secondary | ICD-10-CM | POA: Diagnosis not present

## 2023-07-27 NOTE — Progress Notes (Signed)
 I,Amy L Pierron,acting as a scribe for Vanna Scotland, MD.,have documented all relevant documentation on the behalf of Vanna Scotland, MD,as directed by  Vanna Scotland, MD while in the presence of Vanna Scotland, MD.  07/27/2023 2:13 PM   Logan Morgan [birth year] [id number]  Referring provider: Barbette Reichmann, MD [address] [facility] [place],  [place] [address]   HPI: 83 year-old with a personal history of renal mass presents today for a follow-up.  His last MRI was 11/13/2021. He underwent a follow-up MRI on 06/15/2023 which shows basically an unchanged lesion.   He is doing well overall with no new symptoms or complaints.   PMH: Past Medical History:  Diagnosis Date   Atrial fibrillation (HCC)    COPD (chronic obstructive pulmonary disease) (HCC)    Dyspnea    Pt reports due to inactivity   Dysrhythmia    Emphysema lung (HCC)    Prostate cancer [facility])     Surgical History: Past Surgical History:  Procedure Laterality Date   CATARACT EXTRACTION W/PHACO Left 09/15/2021   Procedure: CATARACT EXTRACTION PHACO AND INTRAOCULAR LENS PLACEMENT (IOC) LEFT 12.74 01:04.8;  Surgeon: Nevada Crane, MD;  Location: [facility] SUR[facility]ERY [facility]TR;  Service: Ophthalmology;  Laterality: Left;   CATARACT EXTRACTION W/PHACO Right 09/29/2021   Procedure: CATARACT EXTRACTION PHACO AND INTRAOCULAR LENS PLACEMENT (IOC) RIGHT;  Surgeon: Nevada Crane, MD;  Location: [facility] SURGERY [facility]TR;  Service: Ophthalmology;  Laterality: Right;  14.81 01:15.8   COLONOSCOPY     COLONOSCOPY WITH PROPOFOL N/A 10/28/2021   Procedure: COLONOSCOPY WITH PROPOFOL;  Surgeon: Midge Minium, MD;  Location: [facility] ENDOSCOPY;  Service: Endoscopy;  Laterality: N/A;   ESOPHAGOGASTRODUODENOSCOPY N/A 10/28/2021   Procedure: ESOPHAGOGASTRODUODENOSCOPY (EGD);  Surgeon: Midge Minium, MD;  Location: [facility] ENDOSCOPY;  Service: Endoscopy;  Laterality: N/A;   EYE SURGERY     GIVENS CAPSULE STUDY N/A 11/24/2021    Procedure: GIVENS CAPSULE STUDY;  Surgeon: Midge Minium, MD;  Location: [facility] ENDOSCOPY;  Service: Endoscopy;  Laterality: N/A;   HAND SURGERY     x3. as child   RADIOACTIVE SEED IMPLANT     TONSILLECTOMY     as child    Home Medications:  Allergies as of 07/27/2023   No Known Allergies      Medication List        Accurate as of July 27, 2023  2:13 PM. If you have any questions, ask your nurse or doctor.          albuterol 108 (90 Base) MCG/ACT inhaler Commonly known as: VENTOLIN HFA Inhale 2 puffs into the lungs every 6 (six) hours as needed for wheezing or shortness of breath. DX: J43.2   amiodarone 200 MG tablet Commonly known as: PACERONE Take 200 mg by mouth daily.   atorvastatin 20 MG tablet Commonly known as: LIPITOR Take 20 mg by mouth daily.   Bevespi Aerosphere 9-4.8 MCG/ACT Aero Generic drug: Glycopyrrolate-Formoterol Inhale 2 puffs into the lungs 2 (two) times daily.   ferrous sulfate 325 (65 FE) MG tablet Take 325 mg by mouth daily.   Fish Oil 1000 MG Caps Take by mouth daily.   furosemide 20 MG tablet Commonly known as: LASIX Take 20 mg by mouth daily.   glucosamine-chondroitin 500-400 MG tablet Take 1 tablet by mouth 2 (two) times daily.   metoprolol succinate 25 MG 24 hr tablet Commonly known as: TOPROL-XL Take 25 mg by mouth daily.   olmesartan 40 MG tablet Commonly known as: BENICAR Take  by mouth.   ONE-A-DAY MENS PO Take 1 tablet by mouth daily.   potassium chloride SA 20 MEQ tablet Commonly known as: KLOR-CON M Take 1 tablet (20 mEq total) by mouth daily. What changed: when to take this   pravastatin 40 MG tablet Commonly known as: PRAVACHOL Take 40 mg by mouth daily.   rivaroxaban 20 MG Tabs tablet Commonly known as: XARELTO Take 20 mg by mouth daily with supper.   sertraline 100 MG tablet Commonly known as: ZOLOFT Take 150 mg by mouth daily.   tamsulosin 0.4 MG Caps capsule Commonly known as: FLOMAX Take 0.4 mg  by mouth daily.        Family History: Family History  Problem Relation Age of Onset   COPD Mother     Social History:  reports that he quit smoking about 27 years ago. His smoking use included cigarettes. He started smoking about 65 years ago. He has never used smokeless tobacco. He reports current alcohol use of about 18.0 standard drinks of alcohol per week. He reports that he does not use drugs.   Physical Exam: BP 105/62   Pulse 83   Constitutional:  Alert and oriented, No acute distress. HEENT: [place] AT, moist mucus membranes.  Trachea midline, no masses. Neurologic: Grossly intact, no focal deficits, moving all 4 extremities. Psychiatric: Normal mood and affect.   Pertinent Imaging: Narrative & Impression  CLINICAL DATA:  Renal mass/cyst, indeterminate renal lesion   EXAM: MRI ABDOMEN WITHOUT AND WITH CONTRAST   TECHNIQUE: Multiplanar multisequence MR imaging of the abdomen was performed both before and after the administration of intravenous contrast.   CONTRAST:  7mL GADAVIST GADOBUTROL 1 MMOL/ML IV SOLN   COMPARISON:  MRI abdomen 11/16/2021   FINDINGS: Lower chest: No acute findings.   Hepatobiliary: Normal hepatic size and contour. Diffuse parenchymal signal loss on opposed phase imaging consistent with steatosis. Scattered simple cysts. No enhancing lesion. Normal gallbladder. No biliary ductal dilation.   Pancreas: No mass, inflammatory changes, or other parenchymal abnormality identified.   Spleen:  Within normal limits in size and appearance.   Adrenals/Urinary Tract: Redemonstrated T2 hyperintense, mildly enhancing right upper pole cortical lesion again measuring approximately 1.4 by 1.4 x 1.4 cm. Simple bilateral cortical cysts. Stable T1 hyperintense subcentimeter right midpole cortical lesion, likely a hemorrhagic/proteinaceous cyst. No hydronephrosis. Normal adrenal glands.   Stomach/Bowel: Normal stomach. No visualized bowel obstruction  or inflammation. Colonic diverticulosis.   Vascular/Lymphatic: No pathologically enlarged lymph nodes identified. No abdominal aortic aneurysm demonstrated.   Other:  None.   Musculoskeletal: No suspicious bone lesions identified. Multilevel degenerative changes.   IMPRESSION: 1. Overall similar appearance of the complex right upper pole renal lesion again suggestive of renal cell carcinoma. 2. No intra-abdominal lymphadenopathy. 3. Mild hepatic steatosis.   Electronically Signed   By: Levi Aland M.D.   On: 06/18/2023 11:55  Personally reviewed the above scan, compared with previous, and agree with radiologic interpretation.    Assessment & Plan:    1. Renal mass  - Unchanged lesion. Feel it is not necessary to intervene with a treatment at this stage. Continue surveillance given his very indolent renal mass. Plan for an ultrasound in 2 years instead of MRI due to rate of slow growth and the previous images as a baseline.   - Explained the difference between with and without contrast MRI's.   Return in about 2 years (around 07/26/2025) for ultrasound.  [facility] [address] [place],  [place] [address] [phone]

## 2023-08-06 ENCOUNTER — Telehealth: Payer: Self-pay | Admitting: Emergency Medicine

## 2023-08-13 NOTE — Telephone Encounter (Signed)
 Fax confirmation received 08/13/23

## 2023-10-07 ENCOUNTER — Telehealth: Payer: Self-pay | Admitting: Emergency Medicine

## 2023-10-07 NOTE — Telephone Encounter (Signed)
 Cmn received from [employer] for oxygen therapy.

## 2023-10-14 NOTE — Telephone Encounter (Signed)
 This encounter was created in error - please disregard.

## 2023-10-21 NOTE — Telephone Encounter (Signed)
 Rc'd signed copy. Will fax and verify it went thru.

## 2023-10-22 NOTE — Telephone Encounter (Signed)
 Rc'd CMN from [employer] again but this has already been signed and ret.

## 2023-10-27 ENCOUNTER — Encounter: Attending: Pulmonary Disease | Admitting: *Deleted

## 2023-10-27 DIAGNOSIS — J449 Chronic obstructive pulmonary disease, unspecified: Secondary | ICD-10-CM

## 2023-10-27 NOTE — Progress Notes (Signed)
 Incomplete Orientation   Mr. Feagans was scheduled to pulmonary rehab orientation. Upon further discussion about what to expect from the program, Logan Morgan revealed that he has been struggling with incontinence whenever he gets overly short of breath. Staff encouraged him to contact his primary doctor and urologist concerning this issue as he is very hesitant to start exercising because of this. Instructions given on how to contact office when or if he is able to attend and we will reschedule his orientation then.

## 2023-11-03 ENCOUNTER — Ambulatory Visit

## 2023-11-17 ENCOUNTER — Telehealth: Payer: Self-pay | Admitting: Emergency Medicine

## 2023-11-17 NOTE — Telephone Encounter (Signed)
 CMN for oxygen [employer]

## 2023-11-18 ENCOUNTER — Encounter: Payer: Self-pay | Admitting: Urology

## 2023-11-18 ENCOUNTER — Ambulatory Visit (INDEPENDENT_AMBULATORY_CARE_PROVIDER_SITE_OTHER): Admitting: Urology

## 2023-11-18 VITALS — BP 147/77 | HR 65 | Ht 68.0 in | Wt 155.0 lb

## 2023-11-18 DIAGNOSIS — R339 Retention of urine, unspecified: Secondary | ICD-10-CM

## 2023-11-18 DIAGNOSIS — R350 Frequency of micturition: Secondary | ICD-10-CM

## 2023-11-18 DIAGNOSIS — R3129 Other microscopic hematuria: Secondary | ICD-10-CM | POA: Diagnosis not present

## 2023-11-18 DIAGNOSIS — N39498 Other specified urinary incontinence: Secondary | ICD-10-CM | POA: Diagnosis not present

## 2023-11-18 LAB — URINALYSIS, COMPLETE
Bilirubin, UA: NEGATIVE
Glucose, UA: NEGATIVE
Nitrite, UA: NEGATIVE
Protein,UA: NEGATIVE
Specific Gravity, UA: 1.015 (ref 1.005–1.030)
Urobilinogen, Ur: 4 mg/dL — ABNORMAL HIGH (ref 0.2–1.0)
pH, UA: 6 (ref 5.0–7.5)

## 2023-11-18 LAB — MICROSCOPIC EXAMINATION: RBC, Urine: >30 /HPF — AB (ref 0–2)

## 2023-11-18 LAB — BLADDER SCAN AMB NON-IMAGING: Scan Result: 69

## 2023-11-18 NOTE — Patient Instructions (Signed)

## 2023-11-18 NOTE — Progress Notes (Signed)
 Elfrieda Grise Plume,acting as a scribe for Dustin Gimenez, MD.,have documented all relevant documentation on the behalf of Dustin Gimenez, MD,as directed by  Dustin Gimenez, MD while in the presence of Dustin Gimenez, MD.  11/18/23 3:12 PM   Logan Morgan Apr 22, 1941 [id number]  Referring provider: Antonio Baumgarten, MD [address] [facility] [place],  [place] [address]  Chief Complaint  Patient presents with   renal mass    HPI: 83 year old male with a personal history of prostate cancer presents today for evaluation of urinary incontinence. He has been on surveillance for a renal mass and was last seen in February.   He reports experiencing urinary frequency and a weak stream, for which he is taking Flomax .  He has a significant history of emphysema and has been experiencing episodes of urinary incontinence over the last several years, which he associates with stress and shortness of breath. He describes these episodes as significant, with urine running down his leg, and occurring sometimes in public settings. He denies any burning sensation or blood in his urine.   He has a history of smoking but quit almost 30 years ago.   He underwent brachytherapy for prostate cancer approximately 15 years ago, and his most recent PSA was undetectable.   He has been informed by his primary care physician of recurrent UTIs based on lab work, although he does not experience typical urinary symptoms.   Recent urine analysis showed abnormal results with white blood cells and red blood cells present.  Results for orders placed or performed in visit on 11/18/23  Microscopic Examination   Urine  Result Value Ref Range   WBC, UA 11-30 (A) 0 - 5 /hpf   RBC, Urine >30 (A) 0 - 2 /hpf   Epithelial Cells (non renal) 0-10 0 - 10 /hpf   Mucus, UA Present (A) Not Estab.   Bacteria, UA Many (A) None seen/Few  Urinalysis, Complete  Result Value Ref Range   Specific Gravity, UA 1.015  1.005 - 1.030   pH, UA 6.0 5.0 - 7.5   Color, UA Yellow Yellow   Appearance Ur Clear Clear   Leukocytes,UA Trace (A) Negative   Protein,UA Negative Negative/Trace   Glucose, UA Negative Negative   Ketones, UA Trace (A) Negative   RBC, UA 3+ (A) Negative   Bilirubin, UA Negative Negative   Urobilinogen, Ur 4.0 (H) 0.2 - 1.0 mg/dL   Nitrite, UA Negative Negative   Microscopic Examination See below:   Bladder Scan (Post Void Residual) in office  Result Value Ref Range   Scan Result 69      PMH: Past Medical History:  Diagnosis Date   Atrial fibrillation (HCC)    COPD (chronic obstructive pulmonary disease) (HCC)    Dyspnea    Pt reports due to inactivity   Dysrhythmia    Emphysema lung (HCC)    Prostate cancer [facility])     Surgical History: Past Surgical History:  Procedure Laterality Date   CATARACT EXTRACTION W/PHACO Left 09/15/2021   Procedure: CATARACT EXTRACTION PHACO AND INTRAOCULAR LENS PLACEMENT (IOC) LEFT 12.74 01:04.8;  Surgeon: Rosa College, MD;  Location: [facility] SURGERY [facility];  Service: Ophthalmology;  Laterality: Left;   CATARACT EXTRACTION W/PHACO Right 09/29/2021   Procedure: CATARACT EXTRACTION PHACO AND INTRAOCULAR LENS PLACEMENT (IOC) RIGHT;  Surgeon: Rosa College, MD;  Location: [facility] SURGERY [facility];  Service: Ophthalmology;  Laterality: Right;  14.81 01:15.8   COLONOSCOPY     COLONOSCOPY  WITH PROPOFOL  N/A 10/28/2021   Procedure: COLONOSCOPY WITH PROPOFOL ;  Surgeon: Marnee Sink, MD;  Location: [facility] ENDOSCOPY;  Service: Endoscopy;  Laterality: N/A;   ESOPHAGOGASTRODUODENOSCOPY N/A 10/28/2021   Procedure: ESOPHAGOGASTRODUODENOSCOPY (EGD);  Surgeon: Marnee Sink, MD;  Location: [facility] ENDOSCOPY;  Service: Endoscopy;  Laterality: N/A;   EYE SURGERY     GIVENS CAPSULE STUDY N/A 11/24/2021   Procedure: GIVENS CAPSULE STUDY;  Surgeon: Marnee Sink, MD;  Location: [facility] ENDOSCOPY;  Service: Endoscopy;  Laterality: N/A;   HAND SURGERY     x3. as child    RADIOACTIVE SEED IMPLANT     TONSILLECTOMY     as child    Home Medications:  Allergies as of 11/18/2023   No Known Allergies      Medication List        Accurate as of November 18, 2023  3:12 PM. If you have any questions, ask your nurse or doctor.          albuterol  108 (90 Base) MCG/ACT inhaler Commonly known as: VENTOLIN  HFA Inhale 2 puffs into the lungs every 6 (six) hours as needed for wheezing or shortness of breath. DX: J43.2   amiodarone  200 MG tablet Commonly known as: PACERONE  Take 200 mg by mouth daily.   atorvastatin 20 MG tablet Commonly known as: LIPITOR Take 20 mg by mouth daily.   Bevespi  Aerosphere 9-4.8 MCG/ACT Aero Generic drug: Glycopyrrolate-Formoterol  Inhale 2 puffs into the lungs 2 (two) times daily.   ferrous sulfate 325 (65 FE) MG tablet Take 325 mg by mouth daily.   Fish Oil 1000 MG Caps Take by mouth daily.   furosemide 20 MG tablet Commonly known as: LASIX Take 20 mg by mouth daily.   glucosamine-chondroitin 500-400 MG tablet Take 1 tablet by mouth 2 (two) times daily.   metoprolol  succinate 25 MG 24 hr tablet Commonly known as: TOPROL -XL Take 25 mg by mouth daily.   olmesartan 40 MG tablet Commonly known as: BENICAR Take by mouth.   ONE-A-DAY MENS PO Take 1 tablet by mouth daily.   potassium chloride  SA 20 MEQ tablet Commonly known as: KLOR-CON  M Take 1 tablet (20 mEq total) by mouth daily. What changed: when to take this   pravastatin  40 MG tablet Commonly known as: PRAVACHOL  Take 40 mg by mouth daily.   rivaroxaban  20 MG Tabs tablet Commonly known as: XARELTO  Take 20 mg by mouth daily with supper.   sertraline  100 MG tablet Commonly known as: ZOLOFT  Take 150 mg by mouth daily.   tamsulosin  0.4 MG Caps capsule Commonly known as: FLOMAX  Take 0.4 mg by mouth daily.        Family History: Family History  Problem Relation Age of Onset   COPD Mother     Social History:  reports that he quit smoking about  27 years ago. His smoking use included cigarettes. He started smoking about 65 years ago. He has never used smokeless tobacco. He reports current alcohol use of about 18.0 standard drinks of alcohol per week. He reports that he does not use drugs.   Physical Exam: BP (!) 147/77   Pulse 65   Ht 5' 8 (1.727 m)   Wt 155 lb (70.3 kg)   BMI 23.57 kg/m   Constitutional:  Alert and oriented, No acute distress. HEENT: [place] AT, moist mucus membranes.  Trachea midline, no masses. Neurologic: Grossly intact, no focal deficits, moving all 4 extremities. Psychiatric: Normal mood and affect.   Assessment & Plan:  1. Urinary Incontinence - He reports stress-induced urge incontinence  - Pelvic floor exercises are recommended to help manage symptoms.  - Further treatment options will be considered after ruling out other underlying conditions.  2. Microscopic hematuria - The presence of red blood cells in the urine is concerning for bladder cancer, especially given his history of smoking and prostate cancer treatment with brachytherapy.  - A cystoscopy is scheduled to evaluate the bladder lining and rule out malignancy.  3. Possible UTI - Although he does not report typical UTI symptoms, a urine culture will be performed to rule out infection.  - The urinalysis shows white blood cells, but no bacteria were noted, and the nitrate test is negative.  Return in about 1 week (around 11/25/2023) for cystoscopy. Follow up with Dr. Estanislao Heimlich to review results and discuss further management.  I have reviewed the above documentation for accuracy and completeness, and I agree with the above.   Dustin Gimenez, MD  [facility] [address] [place], [place] [address] [phone]

## 2023-11-24 LAB — CULTURE, URINE COMPREHENSIVE

## 2023-11-26 ENCOUNTER — Ambulatory Visit: Payer: Self-pay | Admitting: Urology

## 2023-11-26 MED ORDER — CIPROFLOXACIN HCL 250 MG PO TABS: 250.0000 mg | ORAL_TABLET | Freq: Two times a day (BID) | ORAL | 0 refills | Status: DC

## 2023-11-26 NOTE — Telephone Encounter (Signed)
 Rc'd signed O2 order. Will fax to [phone]. Once confirmation rc'd will attach and send to scan.

## 2023-12-14 ENCOUNTER — Other Ambulatory Visit: Admitting: Urology

## 2023-12-14 ENCOUNTER — Ambulatory Visit (INDEPENDENT_AMBULATORY_CARE_PROVIDER_SITE_OTHER): Admitting: Urology

## 2023-12-14 VITALS — BP 111/62 | HR 79 | Ht 68.0 in | Wt 155.0 lb

## 2023-12-14 DIAGNOSIS — R339 Retention of urine, unspecified: Secondary | ICD-10-CM | POA: Diagnosis not present

## 2023-12-14 MED ORDER — NITROFURANTOIN MONOHYD MACRO 100 MG PO CAPS
100.0000 mg | ORAL_CAPSULE | Freq: Once | ORAL | Status: AC
  Administered 2023-12-14: 100 mg via ORAL

## 2023-12-14 MED ORDER — LIDOCAINE HCL URETHRAL/MUCOSAL 2 % EX GEL
1.0000 | Freq: Once | CUTANEOUS | Status: AC
  Administered 2023-12-14: 1 via URETHRAL

## 2023-12-14 NOTE — Patient Instructions (Signed)
 Your symptoms are most consistent with overactive bladder.  We gave you 6 weeks of Gemtesa samples, this usually takes about 2 weeks to see improvement.  This should improve any urgency, frequency, or leakage/urination with urgency where you do not make it in time.  If this is helpful, would need to stay on this medication long-term.  Your urethra, bladder, and prostate were normal today on cystoscopy.

## 2023-12-14 NOTE — Progress Notes (Signed)
 Cystoscopy Procedure Note:  Indication: Microscopic hematuria, urinary symptoms  After informed consent and discussion of the procedure and its risks, NICKLAS MCSWEENEY was positioned and prepped in the standard fashion. Cystoscopy was performed with a flexible cystoscope. The urethra, bladder neck and entire bladder was visualized in a standard fashion. The prostate was normal in size. The ureteral orifices were visualized in their normal location and orientation.  Bladder was empty, filled with saline to distend.  Mucosa grossly normal throughout, no abnormalities on retroflexion.  Findings: Normal cystoscopy  ------------------------------------------------------------------  Assessment and Plan: 83 year old male with history of prostate cancer over 15 years ago treated with brachytherapy, small 1.4 cm right upper pole renal mass stable on cross-sectional imaging since January 2022, previously followed by Dr. Penne.  He was set up for cystoscopy today for urinary symptoms and microscopic hematuria.  Multiple urine cultures have been positive, however no change in his symptoms after recent course of antibiotics.  His primary urinary symptoms are urge incontinence that happens a few times a month, and may be a large volume.  He currently is folding up a small napkin in his underwear in case he has leakage.  Challenging history.  We discussed that overactive bladder (OAB) is not a disease, but is a symptom complex that is generally not life-threatening.  Symptoms typically include urinary urgency, frequency, and urge incontinence.  There are numerous treatment options, however there are risks and benefits with both medical and surgical management.  First-line treatment is behavioral therapies including bladder training, pelvic floor muscle training, and fluid management.  Second line treatments include oral antimuscarinics(Ditropan er, Trospium) and beta-3 agonist (Mybetriq). There is typically a period  of medication trial (4-8 weeks) to find the optimal therapy and dosing. If symptoms are bothersome despite the above management, third line options include intra-detrusor botox, peripheral tibial nerve stimulation (PTNS), and interstim (SNS). These are more invasive treatments with higher side effect profile, but may improve quality of life for patients with severe OAB symptoms.   Reassurance provided regarding stable right renal mass on MRI, I personally viewed and interpreted the most recent MRI dated 10/11/2020 showing a stable 1.4 cm right upper pole renal mass.  Can continue active surveillance.  PSA has remained low, most recently undetectable from November 2024, reassurance provided regarding no recurrence of prostate cancer.  Trial of Gemtesa for overactive symptoms, risks and benefits discussed Continue yearly imaging for renal mass, could consider changing to ultrasound based on stability in the future RTC 6 weeks PVR and symptom check  Redell Burnet, MD 12/14/2023

## 2023-12-17 ENCOUNTER — Encounter: Attending: Pulmonary Disease | Admitting: *Deleted

## 2023-12-17 DIAGNOSIS — J449 Chronic obstructive pulmonary disease, unspecified: Secondary | ICD-10-CM | POA: Insufficient documentation

## 2023-12-17 NOTE — Progress Notes (Signed)
 Initial phone call completed. Diagnosis can be found in [facility] 5/6. EP Orientation scheduled for Monday 7/14 at 9am.

## 2023-12-20 ENCOUNTER — Encounter

## 2023-12-20 VITALS — Ht 68.5 in | Wt 159.4 lb

## 2023-12-20 DIAGNOSIS — J449 Chronic obstructive pulmonary disease, unspecified: Secondary | ICD-10-CM

## 2023-12-20 NOTE — Progress Notes (Signed)
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 with the use of models to demonstrate the basic use of the AED with the basic ABC's of resuscitation.    Anatomy and Cardiac Procedures: - Group verbal and visual presentation and models provide information about basic cardiac anatomy and function. Reviews the testing methods done to diagnose heart disease and the outcomes of the test results. Describes the treatment choices: Medical Management, Angioplasty, or Coronary Bypass Surgery for treating various heart conditions including Myocardial Infarction, Angina, Valve Disease, and Cardiac Arrhythmias.  Written material given at graduation.   Medication Safety: - Group verbal and visual instruction to review commonly prescribed medications for heart and lung disease. Reviews the medication, class of the drug, and side effects. Includes the steps to properly store meds and maintain the prescription  regimen.  Written material given at graduation.   Other: -Provides group and verbal instruction on various topics (see comments)   Knowledge Questionnaire Score:  Knowledge Questionnaire Score - 12/20/23 1118       Knowledge Questionnaire Score   Pre Score 14/18           Core Components/Risk Factors/Patient Goals at Admission:  Personal Goals and Risk Factors at Admission - 12/20/23 1118       Core Components/Risk Factors/Patient Goals on Admission   Improve shortness of breath with ADL's Yes    Intervention Provide education, individualized exercise plan and daily activity instruction to help decrease symptoms of SOB with activities of daily living.    Expected Outcomes Short Term: Improve cardiorespiratory fitness to achieve a reduction  of symptoms when performing ADLs;Long Term: Be able to perform more ADLs without symptoms or delay the onset of symptoms    Hypertension Yes    Intervention Provide education on lifestyle modifcations including regular physical activity/exercise, weight management, moderate sodium restriction and increased consumption of fresh fruit, vegetables, and low fat dairy, alcohol moderation, and smoking cessation.;Monitor prescription use compliance.    Expected Outcomes Short Term: Continued assessment and intervention until BP is < 140/57mm HG in hypertensive participants. < 130/38mm HG in hypertensive participants with diabetes, heart failure or chronic kidney disease.;Long Term: Maintenance of blood pressure at goal levels.          Education:Diabetes - Individual verbal and written instruction to review signs/symptoms of diabetes, desired ranges of glucose level fasting, after meals and with exercise. Acknowledge that pre and post exercise glucose checks will be done for 3 sessions at entry of program.   Know Your Numbers and Heart Failure: - Group verbal and visual instruction to discuss disease risk factors for cardiac and pulmonary disease  and treatment options.  Reviews associated critical values for Overweight/Obesity, Hypertension, Cholesterol, and Diabetes.  Discusses basics of heart failure: signs/symptoms and treatments.  Introduces Heart Failure Zone chart for action plan for heart failure.  Written material given at graduation.   Core Components/Risk Factors/Patient Goals Review:    Core Components/Risk Factors/Patient Goals at Discharge (Final Review):    ITP Comments:  ITP Comments     Row Name 12/17/23 1434 12/20/23 1104         ITP Comments Initial phone call completed. Diagnosis can be found in [facility] 5/6. EP Orientation scheduled for Monday 7/14 at 9am. Completed and gym orientation for pulmonary rehab. Initial ITP created and sent for review to Dr. Faud Aleskerov, Medical Director.         Comments: Initial ITP

## 2023-12-20 NOTE — Patient Instructions (Signed)
 Patient Instructions  Patient Details  Name: Logan Morgan MRN: [id number] Date of Birth: [birth year] Referring Provider:  Parris Manna, MD  Below are your personal goals for exercise, nutrition, and risk factors. Our goal is to help you stay on track towards obtaining and maintaining these goals. We will be discussing your progress on these goals with you throughout the program.  Initial Exercise Prescription:  Initial Exercise Prescription - 12/20/23 1100       Date of Initial Exercise RX and Referring Provider   Date 12/20/23    Referring Provider Dr. Manna Parris      Oxygen   Oxygen Continuous    Liters 0-1L   As needed   Maintain Oxygen Saturation 88% or higher      Treadmill   MPH 2    Grade 0    Minutes 15    METs 2.53      NuStep   Level 2    SPM 80    Minutes 15    METs 2      Arm Ergometer   Level 1    Watts 25    RPM 25    Minutes 15    METs 2      REL-XR   Level 2    Watts 25    Speed 50    Minutes 15    METs 2      T5 Nustep   Level 2    SPM 80    Minutes 15    METs 2      Track   Laps 27    Minutes 15    METs 2      Prescription Details   Duration Progress to 30 minutes of continuous aerobic without signs/symptoms of physical distress      Intensity   THRR 40-80% of Max Heartrate 101-125    Ratings of Perceived Exertion 11-13    Perceived Dyspnea 0-4      Progression   Progression Continue to progress workloads to maintain intensity without signs/symptoms of physical distress.      Resistance Training   Training Prescription Yes    Weight 5lb    Reps 10-15          Exercise Goals: Frequency: Be able to perform aerobic exercise two to three times per week in program working toward 2-5 days per week of home exercise.  Intensity: Work with a perceived exertion of 11 (fairly light) - 15 (hard) while following your exercise prescription.  We will make changes to your prescription with you as you progress through the  program.   Duration: Be able to do 30 to 45 minutes of continuous aerobic exercise in addition to a 5 minute warm-up and a 5 minute cool-down routine.   Nutrition Goals: Your personal nutrition goals will be established when you do your nutrition analysis with the dietician.  The following are general nutrition guidelines to follow: Cholesterol < 200mg /day Sodium < 1500mg /day Fiber: Men over 50 yrs - 30 grams per day  Personal Goals:  Personal Goals and Risk Factors at Admission - 12/20/23 1118       Core Components/Risk Factors/Patient Goals on Admission   Improve shortness of breath with ADL's Yes    Intervention Provide education, individualized exercise plan and daily activity instruction to help decrease symptoms of SOB with activities of daily living.    Expected Outcomes Short Term: Improve cardiorespiratory fitness to achieve a reduction of symptoms when performing ADLs;Long Term:  Be able to perform more ADLs without symptoms or delay the onset of symptoms    Hypertension Yes    Intervention Provide education on lifestyle modifcations including regular physical activity/exercise, weight management, moderate sodium restriction and increased consumption of fresh fruit, vegetables, and low fat dairy, alcohol moderation, and smoking cessation.;Monitor prescription use compliance.    Expected Outcomes Short Term: Continued assessment and intervention until BP is < 140/74mm HG in hypertensive participants. < 130/74mm HG in hypertensive participants with diabetes, heart failure or chronic kidney disease.;Long Term: Maintenance of blood pressure at goal levels.          Exercise Goals and Review:  Exercise Goals     Row Name 12/20/23 1111             Exercise Goals   Increase Physical Activity Yes       Intervention Provide advice, education, support and counseling about physical activity/exercise needs.;Develop an individualized exercise prescription for aerobic and resistive  training based on initial evaluation findings, risk stratification, comorbidities and participant's personal goals.       Expected Outcomes Short Term: Attend rehab on a regular basis to increase amount of physical activity.;Long Term: Add in home exercise to make exercise part of routine and to increase amount of physical activity.;Long Term: Exercising regularly at least 3-5 days a week.       Increase Strength and Stamina Yes       Intervention Provide advice, education, support and counseling about physical activity/exercise needs.;Develop an individualized exercise prescription for aerobic and resistive training based on initial evaluation findings, risk stratification, comorbidities and participant's personal goals.       Expected Outcomes Short Term: Increase workloads from initial exercise prescription for resistance, speed, and METs.;Long Term: Improve cardiorespiratory fitness, muscular endurance and strength as measured by increased METs and functional capacity ( );Short Term: Perform resistance training exercises routinely during rehab and add in resistance training at home       Able to understand and use rate of perceived exertion (RPE) scale Yes       Intervention Provide education and explanation on how to use RPE scale       Expected Outcomes Short Term: Able to use RPE daily in rehab to express subjective intensity level;Long Term:  Able to use RPE to guide intensity level when exercising independently       Able to understand and use Dyspnea scale Yes       Intervention Provide education and explanation on how to use Dyspnea scale       Expected Outcomes Short Term: Able to use Dyspnea scale daily in rehab to express subjective sense of shortness of breath during exertion;Long Term: Able to use Dyspnea scale to guide intensity level when exercising independently       Knowledge and understanding of Target Heart Rate Range (THRR) Yes       Intervention Provide education and  explanation of THRR including how the numbers were predicted and where they are located for reference       Expected Outcomes Short Term: Able to state/look up THRR;Short Term: Able to use daily as guideline for intensity in rehab;Long Term: Able to use THRR to govern intensity when exercising independently       Able to check pulse independently Yes       Intervention Provide education and demonstration on how to check pulse in carotid and radial arteries.;Review the importance of being able to check your own pulse for safety during  independent exercise       Expected Outcomes Short Term: Able to explain why pulse checking is important during independent exercise;Long Term: Able to check pulse independently and accurately       Understanding of Exercise Prescription Yes       Intervention Provide education, explanation, and written materials on patient's individual exercise prescription       Expected Outcomes Short Term: Able to explain program exercise prescription;Long Term: Able to explain home exercise prescription to exercise independently          Copy of goals given to participant.

## 2023-12-24 ENCOUNTER — Ambulatory Visit

## 2023-12-24 DIAGNOSIS — J449 Chronic obstructive pulmonary disease, unspecified: Secondary | ICD-10-CM | POA: Diagnosis not present

## 2023-12-24 NOTE — Progress Notes (Signed)
 Daily Session Note  Patient Details  Name: Logan Morgan MRN: [id number] Date of Birth: [birth year] Referring Provider:   Flowsheet Row Pulmonary Rehab from 12/20/2023 in [facility] Cardiac and Pulmonary Rehab  Referring Provider Dr. Halina Picking    Encounter Date: 12/24/2023  Check In:  Session Check In - 12/24/23 1053       Check-In   Supervising physician immediately available to respond to emergencies See telemetry face sheet for immediately available ER MD    Location [facility]-Cardiac & Pulmonary Rehab    Staff Present Burnard Davenport RN,BSN,MPA;Maxon Burnell BS, Exercise Physiologist;Joseph [employer] BS, ACSM CEP, Exercise Physiologist    Virtual Visit No    Medication changes reported     No    Fall or balance concerns reported    No    Tobacco Cessation Use Decreased    Warm-up and Cool-down Performed on first and last piece of equipment    Resistance Training Performed Yes    VAD Patient? No    PAD/SET Patient? No      Pain Assessment   Currently in Pain? No/denies             Social History   Tobacco Use  Smoking Status Former   Current packs/day: 0.00   Types: Cigarettes   Start date: 23   Quit date: 1998   Years since quitting: 27.5  Smokeless Tobacco Never    Goals Met:  Independence with exercise equipment Exercise tolerated well No report of concerns or symptoms today Strength training completed today  Goals Unmet:  Not Applicable  Comments: First full day of exercise!  Patient was oriented to gym and equipment including functions, settings, policies, and procedures.  Patient's individual exercise prescription and treatment plan were reviewed.  All starting workloads were established based on the results of the 6 minute walk test done at initial orientation visit.  The plan for exercise progression was also introduced and progression will be customized based on patient's performance and goals.     Dr. Oneil Pinal is Medical  Director for [facility] Cardiac Rehabilitation.  Dr. Fuad Aleskerov is Medical Director for [facility] Pulmonary Rehabilitation.

## 2023-12-27 ENCOUNTER — Encounter

## 2023-12-27 DIAGNOSIS — J449 Chronic obstructive pulmonary disease, unspecified: Secondary | ICD-10-CM | POA: Diagnosis not present

## 2023-12-27 NOTE — Progress Notes (Signed)
 Daily Session Note  Patient Details  Name: Logan Morgan MRN: [id number] Date of Birth: 05/26/1941 Referring Provider:   Flowsheet Row Pulmonary Rehab from 12/20/2023 in [facility] Cardiac and Pulmonary Rehab  Referring Provider Dr. Halina Picking    Encounter Date: 12/27/2023  Check In:  Session Check In - 12/27/23 1107       Check-In   Supervising physician immediately available to respond to emergencies See telemetry face sheet for immediately available ER MD    Location [facility]-Cardiac & Pulmonary Rehab    Staff Present Burnard Davenport RN,BSN,MPA;Maxon Burnell BS, Exercise Physiologist;Joseph [employer] BS, ACSM CEP, Exercise Physiologist    Virtual Visit No    Medication changes reported     No    Fall or balance concerns reported    No    Tobacco Cessation No Change    Warm-up and Cool-down Performed on first and last piece of equipment    Resistance Training Performed Yes    VAD Patient? No    PAD/SET Patient? No      Pain Assessment   Currently in Pain? No/denies             Social History   Tobacco Use  Smoking Status Former   Current packs/day: 0.00   Types: Cigarettes   Start date: 41   Quit date: 1998   Years since quitting: 27.5  Smokeless Tobacco Never    Goals Met:  Independence with exercise equipment Exercise tolerated well No report of concerns or symptoms today Strength training completed today  Goals Unmet:  Not Applicable  Comments: Pt able to follow exercise prescription today without complaint.  Will continue to monitor for progression.    Dr. Oneil Pinal is Medical Director for [facility] Cardiac Rehabilitation.  Dr. Fuad Aleskerov is Medical Director for [facility] Pulmonary Rehabilitation.

## 2023-12-29 ENCOUNTER — Encounter

## 2023-12-29 DIAGNOSIS — J449 Chronic obstructive pulmonary disease, unspecified: Secondary | ICD-10-CM | POA: Diagnosis not present

## 2023-12-29 NOTE — Progress Notes (Signed)
 Daily Session Note  Patient Details  Name: Logan Morgan MRN: [id number] Date of Birth: 12-09-1940 Referring Provider:   Flowsheet Row Pulmonary Rehab from 12/20/2023 in [facility] Cardiac and Pulmonary Rehab  Referring Provider Dr. Halina Picking    Encounter Date: 12/29/2023  Check In:  Session Check In - 12/29/23 1037       Check-In   Supervising physician immediately available to respond to emergencies See telemetry face sheet for immediately available ER MD    Location [facility]-Cardiac & Pulmonary Rehab    Staff Present Burnard Davenport RN,BSN,MPA;Joseph [facility] RCP,RRT,BSRT;Margaret Best, MS, Exercise Physiologist;Jason Elnor RDN,LDN;Noah Tickle, BS, Exercise Physiologist    Virtual Visit No    Medication changes reported     No    Fall or balance concerns reported    No    Tobacco Cessation No Change    Warm-up and Cool-down Performed on first and last piece of equipment    Resistance Training Performed Yes    VAD Patient? No    PAD/SET Patient? No      Pain Assessment   Currently in Pain? No/denies             Social History   Tobacco Use  Smoking Status Former   Current packs/day: 0.00   Types: Cigarettes   Start date: 53   Quit date: 1998   Years since quitting: 27.5  Smokeless Tobacco Never    Goals Met:  Independence with exercise equipment Exercise tolerated well No report of concerns or symptoms today Strength training completed today  Goals Unmet:  Not Applicable  Comments: Pt able to follow exercise prescription today without complaint.  Will continue to monitor for progression.     Dr. Oneil Pinal is Medical Director for [facility] Cardiac Rehabilitation.  Dr. Fuad Aleskerov is Medical Director for [facility] Pulmonary Rehabilitation.

## 2023-12-31 ENCOUNTER — Encounter

## 2023-12-31 DIAGNOSIS — J449 Chronic obstructive pulmonary disease, unspecified: Secondary | ICD-10-CM | POA: Diagnosis not present

## 2023-12-31 NOTE — Progress Notes (Signed)
 Daily Session Note  Patient Details  Name: Logan Morgan MRN: [id number] Date of Birth: July 29, 1940 Referring Provider:   Flowsheet Row Pulmonary Rehab from 12/20/2023 in [facility] Cardiac and Pulmonary Rehab  Referring Provider Dr. Halina Picking    Encounter Date: 12/31/2023  Check In:  Session Check In - 12/31/23 1051       Check-In   Supervising physician immediately available to respond to emergencies See telemetry face sheet for immediately available ER MD    Location [facility]-Cardiac & Pulmonary Rehab    Staff Present Burnard Davenport RN,BSN,MPA;Maxon Conetta BS, Exercise Physiologist;Joseph Hood RCP,RRT,BSRT;Noah Tickle, BS, Exercise Physiologist    Virtual Visit No    Medication changes reported     No    Fall or balance concerns reported    No    Tobacco Cessation No Change    Warm-up and Cool-down Performed on first and last piece of equipment    Resistance Training Performed Yes    VAD Patient? No    PAD/SET Patient? No      Pain Assessment   Currently in Pain? No/denies             Social History   Tobacco Use  Smoking Status Former   Current packs/day: 0.00   Types: Cigarettes   Start date: 45   Quit date: 1998   Years since quitting: 27.5  Smokeless Tobacco Never    Goals Met:  Proper associated with RPD/PD & O2 Sat Independence with exercise equipment Using PLB without cueing & demonstrates good technique Exercise tolerated well No report of concerns or symptoms today Strength training completed today  Goals Unmet:  Not Applicable  Comments: Pt able to follow exercise prescription today without complaint.  Will continue to monitor for progression.    Dr. Oneil Pinal is Medical Director for [facility] Cardiac Rehabilitation.  Dr. Fuad Aleskerov is Medical Director for [facility] Pulmonary Rehabilitation.

## 2024-01-03 ENCOUNTER — Encounter

## 2024-01-03 DIAGNOSIS — J449 Chronic obstructive pulmonary disease, unspecified: Secondary | ICD-10-CM

## 2024-01-03 NOTE — Progress Notes (Signed)
 Daily Session Note  Patient Details  Name: Logan Morgan MRN: [id number] Date of Birth: 1940/12/03 Referring Provider:   Flowsheet Row Pulmonary Rehab from 12/20/2023 in [facility] Cardiac and Pulmonary Rehab  Referring Provider Dr. Halina Picking    Encounter Date: 01/03/2024  Check In:  Session Check In - 01/03/24 1039       Check-In   Supervising physician immediately available to respond to emergencies See telemetry face sheet for immediately available ER MD    Location [facility]-Cardiac & Pulmonary Rehab    Staff Present Burnard Davenport RN,BSN,MPA;Maxon Burnell BS, Exercise Physiologist;Joseph [employer] BS, ACSM CEP, Exercise Physiologist    Virtual Visit No    Medication changes reported     No    Fall or balance concerns reported    No    Tobacco Cessation No Change    Warm-up and Cool-down Performed on first and last piece of equipment    Resistance Training Performed Yes    VAD Patient? No    PAD/SET Patient? No      Pain Assessment   Currently in Pain? No/denies             Social History   Tobacco Use  Smoking Status Former   Current packs/day: 0.00   Types: Cigarettes   Start date: 15   Quit date: 1998   Years since quitting: 27.5  Smokeless Tobacco Never    Goals Met:  Proper associated with RPD/PD & O2 Sat Independence with exercise equipment Using PLB without cueing & demonstrates good technique Exercise tolerated well No report of concerns or symptoms today Strength training completed today  Goals Unmet:  Not Applicable  Comments: Pt able to follow exercise prescription today without complaint.  Will continue to monitor for progression.    Dr. Oneil Pinal is Medical Director for [facility] Cardiac Rehabilitation.  Dr. Fuad Aleskerov is Medical Director for [facility] Pulmonary Rehabilitation.

## 2024-01-03 NOTE — Progress Notes (Signed)
 Assessment start time: 9:46 AM  Digestive issues/concerns: no known food allergies   24-hours Recall: B: bacon, 2 egg, english muffin, orange juice, coffee cream and sugar OR greek yogurt with granola and cut up fruit L: usually nothing D: chicken or fish, green veggies, potatoes  Education r/t nutrition plan: Patient eats 2 meals per day mostly. He likes veggies and tries to include them at most of his meals. He uses salt at some of his meals. Spoke with him about cutting back on sodium, with goal of less than 1500mg  per day, or a less strict goal of less than 2300mg  per day. Reviewed Mediterranean diet handout. Educated on types of fats, sources, and how to read facts labels. Provided goal of less than 12g of saturated fat per day. Brainstormed a few meals and snacks with foods he likes and will eat.     Goal 1: Read labels and reduce sodium intake to below 2300mg . Ideally 1500mg  per day.  Goal 2: Eat 15-30gProtein and 30-60gCarbs at each meal. Goal 3: Reduce saturated fat, less than 12g per day. Replace bad fats for more heart healthy fats.   End time 10:21 AM

## 2024-01-04 ENCOUNTER — Ambulatory Visit: Payer: Medicare HMO | Admitting: Adult Health

## 2024-01-05 ENCOUNTER — Encounter

## 2024-01-05 DIAGNOSIS — J449 Chronic obstructive pulmonary disease, unspecified: Secondary | ICD-10-CM | POA: Diagnosis not present

## 2024-01-05 NOTE — Progress Notes (Signed)
 Pulmonary Individual Treatment Plan  Patient Details  Name: Logan Morgan MRN: [id number] Date of Birth: 06/10/1940 Referring Provider:   Flowsheet Row Pulmonary Rehab from 12/20/2023 in [facility] Cardiac and Pulmonary Rehab  Referring Provider Dr. Halina Picking    Initial Encounter Date:  Flowsheet Row Pulmonary Rehab from 12/20/2023 in [facility] Cardiac and Pulmonary Rehab  Date 12/20/23    Visit Diagnosis: Chronic obstructive pulmonary disease, unspecified COPD type (HCC)  Patient's Home Medications on Admission:  Current Outpatient Medications:    albuterol  (VENTOLIN  HFA) 108 (90 Base) MCG/ACT inhaler, Inhale 2 puffs into the lungs every 6 (six) hours as needed for wheezing or shortness of breath. DX: J43.2, Disp: 24 g, Rfl: 2   amiodarone  (PACERONE ) 200 MG tablet, Take 200 mg by mouth daily., Disp: , Rfl:    atorvastatin (LIPITOR) 20 MG tablet, Take 20 mg by mouth daily., Disp: , Rfl:    ciprofloxacin  (CIPRO ) 250 MG tablet, Take 1 tablet (250 mg total) by mouth 2 (two) times daily., Disp: 14 tablet, Rfl: 0   ferrous sulfate 325 (65 FE) MG tablet, Take 325 mg by mouth daily., Disp: , Rfl:    furosemide (LASIX) 20 MG tablet, Take 20 mg by mouth daily., Disp: , Rfl:    glucosamine-chondroitin 500-400 MG tablet, Take 1 tablet by mouth 2 (two) times daily. , Disp: , Rfl:    Glycopyrrolate-Formoterol  (BEVESPI  AEROSPHERE) 9-4.8 MCG/ACT AERO, Inhale 2 puffs into the lungs 2 (two) times daily., Disp: 32.1 g, Rfl: 3   metoprolol  succinate (TOPROL -XL) 25 MG 24 hr tablet, Take 25 mg by mouth daily., Disp: , Rfl:    Multiple Vitamin (ONE-A-DAY MENS PO), Take 1 tablet by mouth daily., Disp: , Rfl:    olmesartan (BENICAR) 40 MG tablet, Take by mouth., Disp: , Rfl:    Omega-3 Fatty Acids (FISH OIL) 1000 MG CAPS, Take by mouth daily., Disp: , Rfl:    potassium chloride  SA (K-DUR,KLOR-CON ) 20 MEQ tablet, Take 1 tablet (20 mEq total) by mouth daily. (Patient taking differently: Take 20 mEq by mouth 2 (two)  times daily.), Disp: 30 tablet, Rfl: 6   pravastatin  (PRAVACHOL ) 40 MG tablet, Take 40 mg by mouth daily., Disp: , Rfl:    rivaroxaban  (XARELTO ) 20 MG TABS tablet, Take 20 mg by mouth daily with supper., Disp: , Rfl:    sertraline  (ZOLOFT ) 100 MG tablet, Take 150 mg by mouth daily., Disp: , Rfl:    tamsulosin  (FLOMAX ) 0.4 MG CAPS capsule, Take 0.4 mg by mouth daily., Disp: , Rfl:   Past Medical History: Past Medical History:  Diagnosis Date   Atrial fibrillation (HCC)    COPD (chronic obstructive pulmonary disease) (HCC)    Dyspnea    Pt reports due to inactivity   Dysrhythmia    Emphysema lung (HCC)    Prostate cancer (HCC)     Tobacco Use: Social History   Tobacco Use  Smoking Status Former   Current packs/day: 0.00   Types: Cigarettes   Start date: 31   Quit date: 1998   Years since quitting: 27.5  Smokeless Tobacco Never    Labs: Review Flowsheet       Latest Ref Rng & Units 07/29/2016  Labs for ITP Cardiac and Pulmonary Rehab  Cholestrol 0 - 200 mg/dL 834   LDL (calc) 0 - 99 mg/dL 93   HDL-C >59 mg/dL 51   Trlycerides <849 mg/dL 896      Pulmonary Assessment Scores:  Pulmonary Assessment Scores  Row Name 12/20/23 1113         ADL UCSD   ADL Phase Entry     SOB Score total 11     Rest 0     Walk 2     Stairs 4     Bath 1     Dress 2     Shop 2       CAT Score   CAT Score 18       mMRC Score   mMRC Score 2        UCSD: Self-administered rating of dyspnea associated with activities of daily living (ADLs) 6-point scale (0 = not at all to 5 = maximal or unable to do because of breathlessness)  Scoring Scores range from 0 to 120.  Minimally important difference is 5 units  CAT: CAT can identify the health impairment of COPD patients and is better correlated with disease progression.  CAT has a scoring range of zero to 40. The CAT score is classified into four groups of low (less than 10), medium (10 - 20), high (21-30) and very high  (31-40) based on the impact level of disease on health status. A CAT score over 10 suggests significant symptoms.  A worsening CAT score could be explained by an exacerbation, poor medication adherence, poor inhaler technique, or progression of COPD or comorbid conditions.  CAT MCID is 2 points  mMRC: mMRC (Modified Medical Research Council) Dyspnea Scale is used to assess the degree of baseline functional disability in patients of respiratory disease due to dyspnea. No minimal important difference is established. A decrease in score of 1 point or greater is considered a positive change.   Pulmonary Function Assessment:   Exercise Target Goals: Exercise Program Goal: Individual exercise prescription set using results from initial 6 min walk test and THRR while considering  patient's activity barriers and safety.   Exercise Prescription Goal: Initial exercise prescription builds to 30-45 minutes a day of aerobic activity, 2-3 days per week.  Home exercise guidelines will be given to patient during program as part of exercise prescription that the participant will acknowledge.  Education: Aerobic Exercise: - Group verbal and visual presentation on the components of exercise prescription. Introduces F.I.T.T principle from ACSM for exercise prescriptions.  Reviews F.I.T.T. principles of aerobic exercise including progression. Written material given at graduation.   Education: Resistance Exercise: - Group verbal and visual presentation on the components of exercise prescription. Introduces F.I.T.T principle from ACSM for exercise prescriptions  Reviews F.I.T.T. principles of resistance exercise including progression. Written material given at graduation.    Education: Exercise & Equipment Safety: - Individual verbal instruction and demonstration of equipment use and safety with use of the equipment. Flowsheet Row Pulmonary Rehab from 12/29/2023 in [facility] Cardiac and Pulmonary Rehab  Date 12/20/23   Educator [facility]  Instruction Review Code 1- Verbalizes Understanding    Education: Exercise Physiology & General Exercise Guidelines: - Group verbal and written instruction with models to review the exercise physiology of the cardiovascular system and associated critical values. Provides general exercise guidelines with specific guidelines to those with heart or lung disease.  Flowsheet Row Pulmonary Rehab from 12/29/2023 in [facility] Cardiac and Pulmonary Rehab  Education need identified 12/20/23  Date 12/29/23  Educator nt  Instruction Review Code 1- TEFL teacher Understanding    Education: Flexibility, Balance, Mind/Body Relaxation: - Group verbal and visual presentation with interactive activity on the components of exercise prescription. Introduces F.I.T.T principle from ACSM for exercise prescriptions. Reviews  F.I.T.T. principles of flexibility and balance exercise training including progression. Also discusses the mind body connection.  Reviews various relaxation techniques to help reduce and manage stress (i.e. Deep breathing, progressive muscle relaxation, and visualization). Balance handout provided to take home. Written material given at graduation.   Activity Barriers & Risk Stratification:  Activity Barriers & Cardiac Risk Stratification - 12/20/23 1107       Activity Barriers & Cardiac Risk Stratification   Activity Barriers Muscular Weakness;Shortness of Breath;Balance Concerns          6 Minute Walk:  6 Minute Walk     Row Name 12/20/23 1105         6 Minute Walk   Phase Initial     Distance 1020 feet     Walk Time 6 minutes     # of Rest Breaks 0     MPH 1.93     METS 2.07     RPE 13     Perceived Dyspnea  2     VO2 Peak 7.24     Symptoms No     Resting HR 77 bpm     Resting BP 94/52     Resting Oxygen Saturation  90 %     Exercise Oxygen Saturation  during 6 min walk 86 %     Max Ex. HR 102 bpm     Max Ex. BP 146/56     2 Minute Post BP 104/52        Interval HR   1 Minute HR 95     2 Minute HR 97     3 Minute HR 100     4 Minute HR 101     5 Minute HR 102     6 Minute HR 99     2 Minute Post HR 70     Interval Heart Rate? Yes       Interval Oxygen   Interval Oxygen? Yes     Baseline Oxygen Saturation % 90 %     1 Minute Oxygen Saturation % 92 %     1 Minute Liters of Oxygen 0 L     2 Minute Oxygen Saturation % 90 %     2 Minute Liters of Oxygen 0 L     3 Minute Oxygen Saturation % 88 %     3 Minute Liters of Oxygen 0 L     4 Minute Oxygen Saturation % 87 %     4 Minute Liters of Oxygen 0 L     5 Minute Oxygen Saturation % 87 %     5 Minute Liters of Oxygen 0 L     6 Minute Oxygen Saturation % 86 %     6 Minute Liters of Oxygen 0 L     2 Minute Post Oxygen Saturation % 97 %     2 Minute Post Liters of Oxygen 0 L       Oxygen Initial Assessment:  Oxygen Initial Assessment - 12/17/23 1411       Home Oxygen   Home Oxygen Device None    Sleep Oxygen Prescription Continuous    Home Exercise Oxygen Prescription None   prn   Home Resting Oxygen Prescription None    Compliance with Home Oxygen Use Yes      Intervention   Short Term Goals To learn and understand importance of monitoring SPO2 with pulse oximeter and demonstrate accurate use of the pulse oximeter.;To learn and understand importance  of maintaining oxygen saturations>88%;To learn and demonstrate proper pursed lip breathing techniques or other breathing techniques. ;To learn and demonstrate proper use of respiratory medications    Long  Term Goals Verbalizes importance of monitoring SPO2 with pulse oximeter and return demonstration;Maintenance of O2 saturations>88%;Compliance with respiratory medication;Demonstrates proper use of MDI's;Exhibits proper breathing techniques, such as pursed lip breathing or other method taught during program session          Oxygen Re-Evaluation:  Oxygen Re-Evaluation     Row Name 12/24/23 1055             Home Oxygen    Home Oxygen Device None       Sleep Oxygen Prescription Continuous       Home Exercise Oxygen Prescription None       Home Resting Oxygen Prescription None       Compliance with Home Oxygen Use Yes         Goals/Expected Outcomes   Short Term Goals To learn and understand importance of monitoring SPO2 with pulse oximeter and demonstrate accurate use of the pulse oximeter.;To learn and understand importance of maintaining oxygen saturations>88%;To learn and demonstrate proper pursed lip breathing techniques or other breathing techniques. ;To learn and demonstrate proper use of respiratory medications;To learn and exhibit compliance with exercise, home and travel O2 prescription       Long  Term Goals Verbalizes importance of monitoring SPO2 with pulse oximeter and return demonstration;Maintenance of O2 saturations>88%;Compliance with respiratory medication;Demonstrates proper use of MDI's;Exhibits proper breathing techniques, such as pursed lip breathing or other method taught during program session;Exhibits compliance with exercise, home  and travel O2 prescription       Comments Reviewed PLB technique with pt.  Talked about how it works and it's importance in maintaining their exercise saturations.       Goals/Expected Outcomes Short: Become more profiecient at using PLB. Long: Become independent at using PLB.          Oxygen Discharge (Final Oxygen Re-Evaluation):  Oxygen Re-Evaluation - 12/24/23 1055       Home Oxygen   Home Oxygen Device None    Sleep Oxygen Prescription Continuous    Home Exercise Oxygen Prescription None    Home Resting Oxygen Prescription None    Compliance with Home Oxygen Use Yes      Goals/Expected Outcomes   Short Term Goals To learn and understand importance of monitoring SPO2 with pulse oximeter and demonstrate accurate use of the pulse oximeter.;To learn and understand importance of maintaining oxygen saturations>88%;To learn and demonstrate proper pursed lip  breathing techniques or other breathing techniques. ;To learn and demonstrate proper use of respiratory medications;To learn and exhibit compliance with exercise, home and travel O2 prescription    Long  Term Goals Verbalizes importance of monitoring SPO2 with pulse oximeter and return demonstration;Maintenance of O2 saturations>88%;Compliance with respiratory medication;Demonstrates proper use of MDI's;Exhibits proper breathing techniques, such as pursed lip breathing or other method taught during program session;Exhibits compliance with exercise, home  and travel O2 prescription    Comments Reviewed PLB technique with pt.  Talked about how it works and it's importance in maintaining their exercise saturations.    Goals/Expected Outcomes Short: Become more profiecient at using PLB. Long: Become independent at using PLB.          Initial Exercise Prescription:  Initial Exercise Prescription - 12/20/23 1100       Date of Initial Exercise RX and Referring Provider   Date 12/20/23  Referring Provider Dr. Halina Picking      Oxygen   Oxygen Continuous    Liters 0-1L   As needed   Maintain Oxygen Saturation 88% or higher      Treadmill   MPH 2    Grade 0    Minutes 15    METs 2.53      NuStep   Level 2    SPM 80    Minutes 15    METs 2      Arm Ergometer   Level 1    Watts 25    RPM 25    Minutes 15    METs 2      REL-XR   Level 2    Watts 25    Speed 50    Minutes 15    METs 2      T5 Nustep   Level 2    SPM 80    Minutes 15    METs 2      Track   Laps 27    Minutes 15    METs 2      Prescription Details   Duration Progress to 30 minutes of continuous aerobic without signs/symptoms of physical distress      Intensity   THRR 40-80% of Max Heartrate 101-125    Ratings of Perceived Exertion 11-13    Perceived Dyspnea 0-4      Progression   Progression Continue to progress workloads to maintain intensity without signs/symptoms of physical distress.       Resistance Training   Training Prescription Yes    Weight 5lb    Reps 10-15          Perform Capillary Blood Glucose checks as needed.  Exercise Prescription Changes:   Exercise Prescription Changes     Row Name 12/20/23 1100 12/29/23 0700           Response to Exercise   Blood Pressure (Admit) 94/52 118/60      Blood Pressure (Exercise) 146/56 132/82      Blood Pressure (Exit) 104/52 102/62      Heart Rate (Admit) 77 bpm 69 bpm      Heart Rate (Exercise) 102 bpm 81 bpm      Heart Rate (Exit) 70 bpm 70 bpm      Oxygen Saturation (Admit) 90 % 92 %      Oxygen Saturation (Exercise) 86 % 90 %      Oxygen Saturation (Exit) 97 % 92 %      Rating of Perceived Exertion (Exercise) 13 15      Perceived Dyspnea (Exercise) 2 1      Symptoms SOB none      Comments results first 2 weeks of exercise      Duration -- Progress to 30 minutes of  aerobic without signs/symptoms of physical distress      Intensity -- THRR unchanged        Progression   Progression -- Continue to progress workloads to maintain intensity without signs/symptoms of physical distress.      Average METs -- 2.2        Resistance Training   Training Prescription -- Yes      Weight -- 5lb      Reps -- 10-15        Interval Training   Interval Training -- No        Oxygen   Oxygen -- Continuous      Liters --  0-1L  As needed        NuStep   Level -- 2      Minutes -- 15      METs -- 2.4        Arm Ergometer   Level -- 1      Watts -- 15      Minutes -- 15      METs -- 2.09        Oxygen   Maintain Oxygen Saturation -- 88% or higher         Exercise Comments:   Exercise Comments     Row Name 12/24/23 1054           Exercise Comments First full day of exercise!  Patient was oriented to gym and equipment including functions, settings, policies, and procedures.  Patient's individual exercise prescription and treatment plan were reviewed.  All starting workloads were established based  on the results of the 6 minute walk test done at initial orientation visit.  The plan for exercise progression was also introduced and progression will be customized based on patient's performance and goals.          Exercise Goals and Review:   Exercise Goals     Row Name 12/20/23 1111             Exercise Goals   Increase Physical Activity Yes       Intervention Provide advice, education, support and counseling about physical activity/exercise needs.;Develop an individualized exercise prescription for aerobic and resistive training based on initial evaluation findings, risk stratification, comorbidities and participant's personal goals.       Expected Outcomes Short Term: Attend rehab on a regular basis to increase amount of physical activity.;Long Term: Add in home exercise to make exercise part of routine and to increase amount of physical activity.;Long Term: Exercising regularly at least 3-5 days a week.       Increase Strength and Stamina Yes       Intervention Provide advice, education, support and counseling about physical activity/exercise needs.;Develop an individualized exercise prescription for aerobic and resistive training based on initial evaluation findings, risk stratification, comorbidities and participant's personal goals.       Expected Outcomes Short Term: Increase workloads from initial exercise prescription for resistance, speed, and METs.;Long Term: Improve cardiorespiratory fitness, muscular endurance and strength as measured by increased METs and functional capacity ( );Short Term: Perform resistance training exercises routinely during rehab and add in resistance training at home       Able to understand and use rate of perceived exertion (RPE) scale Yes       Intervention Provide education and explanation on how to use RPE scale       Expected Outcomes Short Term: Able to use RPE daily in rehab to express subjective intensity level;Long Term:  Able to use RPE to  guide intensity level when exercising independently       Able to understand and use Dyspnea scale Yes       Intervention Provide education and explanation on how to use Dyspnea scale       Expected Outcomes Short Term: Able to use Dyspnea scale daily in rehab to express subjective sense of shortness of breath during exertion;Long Term: Able to use Dyspnea scale to guide intensity level when exercising independently       Knowledge and understanding of Target Heart Rate Range (THRR) Yes       Intervention Provide education and explanation of THRR including  how the numbers were predicted and where they are located for reference       Expected Outcomes Short Term: Able to state/look up THRR;Short Term: Able to use daily as guideline for intensity in rehab;Long Term: Able to use THRR to govern intensity when exercising independently       Able to check pulse independently Yes       Intervention Provide education and demonstration on how to check pulse in carotid and radial arteries.;Review the importance of being able to check your own pulse for safety during independent exercise       Expected Outcomes Short Term: Able to explain why pulse checking is important during independent exercise;Long Term: Able to check pulse independently and accurately       Understanding of Exercise Prescription Yes       Intervention Provide education, explanation, and written materials on patient's individual exercise prescription       Expected Outcomes Short Term: Able to explain program exercise prescription;Long Term: Able to explain home exercise prescription to exercise independently          Exercise Goals Re-Evaluation :  Exercise Goals Re-Evaluation     Row Name 12/24/23 1055 12/29/23 0747           Exercise Goal Re-Evaluation   Exercise Goals Review Increase Physical Activity;Able to understand and use rate of perceived exertion (RPE) scale;Knowledge and understanding of Target Heart Rate Range  (THRR);Understanding of Exercise Prescription;Increase Strength and Stamina;Able to understand and use Dyspnea scale;Able to check pulse independently Increase Physical Activity;Increase Strength and Stamina;Understanding of Exercise Prescription      Comments Reviewed RPE and dyspnea scale, THR and program prescription with pt today.  Pt voiced understanding and was given a copy of goals to take home. Kees is doing well in rehab. He was able to attend his first and only session during this review period. During his one session he was able to use the T4 nustep at level 2, and the arm ergometer at level 1. We will continue to monitor his progress in the program.      Expected Outcomes Short: Use RPE daily to regulate intensity. Long: Follow program prescription in THR. Short: Continue to follow exercise prescription. Long: Continue exercise to improve strength and stamina.         Discharge Exercise Prescription (Final Exercise Prescription Changes):  Exercise Prescription Changes - 12/29/23 0700       Response to Exercise   Blood Pressure (Admit) 118/60    Blood Pressure (Exercise) 132/82    Blood Pressure (Exit) 102/62    Heart Rate (Admit) 69 bpm    Heart Rate (Exercise) 81 bpm    Heart Rate (Exit) 70 bpm    Oxygen Saturation (Admit) 92 %    Oxygen Saturation (Exercise) 90 %    Oxygen Saturation (Exit) 92 %    Rating of Perceived Exertion (Exercise) 15    Perceived Dyspnea (Exercise) 1    Symptoms none    Comments first 2 weeks of exercise    Duration Progress to 30 minutes of  aerobic without signs/symptoms of physical distress    Intensity THRR unchanged      Progression   Progression Continue to progress workloads to maintain intensity without signs/symptoms of physical distress.    Average METs 2.2      Resistance Training   Training Prescription Yes    Weight 5lb    Reps 10-15      Interval Training  Interval Training No      Oxygen   Oxygen Continuous    Liters 0-1L    As needed     NuStep   Level 2    Minutes 15    METs 2.4      Arm Ergometer   Level 1    Watts 15    Minutes 15    METs 2.09      Oxygen   Maintain Oxygen Saturation 88% or higher          Nutrition:  Target Goals: Understanding of nutrition guidelines, daily intake of sodium 1500mg , cholesterol 200mg , calories 30% from fat and 7% or less from saturated fats, daily to have 5 or more servings of fruits and vegetables.  Education: All About Nutrition: -Group instruction provided by verbal, written material, interactive activities, discussions, models, and posters to present general guidelines for heart healthy nutrition including fat, fiber, MyPlate, the role of sodium in heart healthy nutrition, utilization of the nutrition label, and utilization of this knowledge for meal planning. Follow up email sent as well. Written material given at graduation.   Biometrics:  Pre Biometrics - 12/20/23 1112       Pre Biometrics   Height 5' 8.5 (1.74 m)    Weight 159 lb 6.4 oz (72.3 kg)    Waist Circumference 35.5 inches    Hip Circumference 39 inches    Waist to Hip Ratio 0.91 %    BMI (Calculated) 23.88    Single Leg Stand 3 seconds           Nutrition Therapy Plan and Nutrition Goals:  Nutrition Therapy & Goals - 01/03/24 1038       Nutrition Therapy   Diet Cardiac, Low Na    Protein (specify units) 70-90    Fiber 30 grams    Whole Grain Foods 3 servings    Saturated Fats 15 max. grams    Fruits and Vegetables 5 servings/day    Sodium 2 grams      Personal Nutrition Goals   Nutrition Goal Read labels and reduce sodium intake to below 2300mg . Ideally 1500mg  per day.    Personal Goal #2 Eat 15-30gProtein and 30-60gCarbs at each meal.    Personal Goal #3 Reduce saturated fat, less than 12g per day. Replace bad fats for more heart healthy fats.    Comments Patient eats 2 meals per day mostly. He likes veggies and tries to include them at most of his meals. He uses  salt at some of his meals. Spoke with him about cutting back on sodium, with goal of less than 1500mg  per day, or a less strict goal of less than 2300mg  per day. Reviewed Mediterranean diet handout. Educated on types of fats, sources, and how to read facts labels. Provided goal of less than 12g of saturated fat per day. Brainstormed a few meals and snacks with foods he likes and will eat.      Intervention Plan   Intervention Prescribe, educate and counsel regarding individualized specific dietary modifications aiming towards targeted core components such as weight, hypertension, lipid management, diabetes, heart failure and other comorbidities.;Nutrition handout(s) given to patient.    Expected Outcomes Short Term Goal: Understand basic principles of dietary content, such as calories, fat, sodium, cholesterol and nutrients.;Short Term Goal: A plan has been developed with personal nutrition goals set during dietitian appointment.;Long Term Goal: Adherence to prescribed nutrition plan.          Nutrition Assessments:  MEDIFICTS Score  Key: >=70 Need to make dietary changes  40-70 Heart Healthy Diet <= 40 Therapeutic Level Cholesterol Diet  Flowsheet Row Pulmonary Rehab from 12/20/2023 in [facility] Cardiac and Pulmonary Rehab  Picture Your Plate Total Score on Admission 61   Picture Your Plate Scores: <59 Unhealthy dietary pattern with much room for improvement. 41-50 Dietary pattern unlikely to meet recommendations for good health and room for improvement. 51-60 More healthful dietary pattern, with some room for improvement.  >60 Healthy dietary pattern, although there may be some specific behaviors that could be improved.   Nutrition Goals Re-Evaluation:   Nutrition Goals Discharge (Final Nutrition Goals Re-Evaluation):   Psychosocial: Target Goals: Acknowledge presence or absence of significant depression and/or stress, maximize coping skills, provide positive support system. Participant  is able to verbalize types and ability to use techniques and skills needed for reducing stress and depression.   Education: Stress, Anxiety, and Depression - Group verbal and visual presentation to define topics covered.  Reviews how body is impacted by stress, anxiety, and depression.  Also discusses healthy ways to reduce stress and to treat/manage anxiety and depression.  Written material given at graduation.   Education: Sleep Hygiene -Provides group verbal and written instruction about how sleep can affect your health.  Define sleep hygiene, discuss sleep cycles and impact of sleep habits. Review good sleep hygiene tips.    Initial Review & Psychosocial Screening:  Initial Psych Review & Screening - 12/17/23 1427       Initial Review   Current issues with Current Stress Concerns;History of Depression    Source of Stress Concerns Family      Family Dynamics   Good Support System? Yes      Barriers   Psychosocial barriers to participate in program There are no identifiable barriers or psychosocial needs.;The patient should benefit from training in stress management and relaxation.      Screening Interventions   Interventions Encouraged to exercise;To provide support and resources with identified psychosocial needs;Provide feedback about the scores to participant    Expected Outcomes Short Term goal: Utilizing psychosocial counselor, staff and physician to assist with identification of specific Stressors or current issues interfering with healing process. Setting desired goal for each stressor or current issue identified.;Long Term Goal: Stressors or current issues are controlled or eliminated.;Short Term goal: Identification and review with participant of any Quality of Life or Depression concerns found by scoring the questionnaire.;Long Term goal: The participant improves quality of Life and PHQ9 Scores as seen by post scores and/or verbalization of changes          Quality of  Life Scores:  Scores of 19 and below usually indicate a poorer quality of life in these areas.  A difference of  2-3 points is a clinically meaningful difference.  A difference of 2-3 points in the total score of the Quality of Life Index has been associated with significant improvement in overall quality of life, self-image, physical symptoms, and general health in studies assessing change in quality of life.  PHQ-9: Review Flowsheet       12/20/2023  Depression screen PHQ 2/9  Decreased Interest 0  Down, Depressed, Hopeless 0  PHQ - 2 Score 0  Altered sleeping 0  Tired, decreased energy 1  Change in appetite 0  Feeling bad or failure about yourself  0  Trouble concentrating 0  Moving slowly or fidgety/restless 0  Suicidal thoughts 1  PHQ-9 Score 2  Difficult doing work/chores Not difficult at all  Interpretation of Total Score  Total Score Depression Severity:  1-4 = Minimal depression, 5-9 = Mild depression, 10-14 = Moderate depression, 15-19 = Moderately severe depression, 20-27 = Severe depression   Psychosocial Evaluation and Intervention:  Psychosocial Evaluation - 12/17/23 1427       Psychosocial Evaluation & Interventions   Interventions Encouraged to exercise with the program and follow exercise prescription;Relaxation education;Stress management education    Comments Mr. Hallowell is coming to pulmonary rehab with COPD. He notes when he gets very short of breath and/or anxious he loses control of his bladder, so he tries to schedule his activities to prevent that form happening if possible. He is following with a urologist for this issue. When asked about stress, he mentions that his wife has been in the memory care unit for 3 years and he visits almost daily. He also states that he has never been the same mentally since his son and daughter-in-law were murdered during the night  while Mr. Gruenberg and his wife were staying at their house. His wife and he followed with a  therapist for years and he mentions he knows life will never be the same. He wants to keep up his independence and work on his stamina while in the program.    Expected Outcomes Short: attend cardiac rehab for education and exercise Long: develop and maintain positive self care habits.    Continue Psychosocial Services  Follow up required by staff          Psychosocial Re-Evaluation:  Psychosocial Re-Evaluation     Row Name 01/03/24 1045             Psychosocial Re-Evaluation   Current issues with None Identified       Comments Patient reports no issues with their current mental states, sleep, stress, depression or anxiety. Will follow up with patient in a few weeks for any changes.       Expected Outcomes Short: Continue to exercise regularly to support mental health and notify staff of any changes. Long: maintain mental health and well being through teaching of rehab or prescribed medications independently.       Interventions Encouraged to attend Pulmonary Rehabilitation for the exercise       Continue Psychosocial Services  Follow up required by staff          Psychosocial Discharge (Final Psychosocial Re-Evaluation):  Psychosocial Re-Evaluation - 01/03/24 1045       Psychosocial Re-Evaluation   Current issues with None Identified    Comments Patient reports no issues with their current mental states, sleep, stress, depression or anxiety. Will follow up with patient in a few weeks for any changes.    Expected Outcomes Short: Continue to exercise regularly to support mental health and notify staff of any changes. Long: maintain mental health and well being through teaching of rehab or prescribed medications independently.    Interventions Encouraged to attend Pulmonary Rehabilitation for the exercise    Continue Psychosocial Services  Follow up required by staff          Education: Education Goals: Education classes will be provided on a weekly basis, covering required  topics. Participant will state understanding/return demonstration of topics presented.  Learning Barriers/Preferences:  Learning Barriers/Preferences - 12/17/23 1414       Learning Barriers/Preferences   Learning Barriers None    Learning Preferences None          General Pulmonary Education Topics:  Infection Prevention: - Provides verbal and written  material to individual with discussion of infection control including proper hand washing and proper equipment cleaning during exercise session. Flowsheet Row Pulmonary Rehab from 12/29/2023 in [facility] Cardiac and Pulmonary Rehab  Date 12/20/23  Educator [facility]  Instruction Review Code 1- Verbalizes Understanding    Falls Prevention: - Provides verbal and written material to individual with discussion of falls prevention and safety. Flowsheet Row Pulmonary Rehab from 12/29/2023 in [facility] Cardiac and Pulmonary Rehab  Date 12/20/23  Educator [facility]  Instruction Review Code 1- Verbalizes Understanding    Chronic Lung Disease Review: - Group verbal instruction with posters, models, PowerPoint presentations and videos,  to review new updates, new respiratory medications, new advancements in procedures and treatments. Providing information on websites and 800 numbers for continued self-education. Includes information about supplement oxygen, available portable oxygen systems, continuous and intermittent flow rates, oxygen safety, concentrators, and Medicare reimbursement for oxygen. Explanation of Pulmonary Drugs, including class, frequency, complications, importance of spacers, rinsing mouth after steroid MDI's, and proper cleaning methods for nebulizers. Review of basic lung anatomy and physiology related to function, structure, and complications of lung disease. Review of risk factors. Discussion about methods for diagnosing sleep apnea and types of masks and machines for OSA. Includes a review of the use of types of environmental controls: home  humidity, furnaces, filters, dust mite/pet prevention, HEPA vacuums. Discussion about weather changes, air quality and the benefits of nasal washing. Instruction on Warning signs, infection symptoms, calling MD promptly, preventive modes, and value of vaccinations. Review of effective airway clearance, coughing and/or vibration techniques. Emphasizing that all should Create an Action Plan. Written material given at graduation. Flowsheet Row Pulmonary Rehab from 12/29/2023 in [facility] Cardiac and Pulmonary Rehab  Education need identified 12/20/23    AED/CPR: - Group verbal and written instruction with the use of models to demonstrate the basic use of the AED with the basic ABC's of resuscitation.    Anatomy and Cardiac Procedures: - Group verbal and visual presentation and models provide information about basic cardiac anatomy and function. Reviews the testing methods done to diagnose heart disease and the outcomes of the test results. Describes the treatment choices: Medical Management, Angioplasty, or Coronary Bypass Surgery for treating various heart conditions including Myocardial Infarction, Angina, Valve Disease, and Cardiac Arrhythmias.  Written material given at graduation.   Medication Safety: - Group verbal and visual instruction to review commonly prescribed medications for heart and lung disease. Reviews the medication, class of the drug, and side effects. Includes the steps to properly store meds and maintain the prescription regimen.  Written material given at graduation.   Other: -Provides group and verbal instruction on various topics (see comments)   Knowledge Questionnaire Score:  Knowledge Questionnaire Score - 12/20/23 1118       Knowledge Questionnaire Score   Pre Score 14/18           Core Components/Risk Factors/Patient Goals at Admission:  Personal Goals and Risk Factors at Admission - 12/20/23 1118       Core Components/Risk Factors/Patient Goals on Admission    Improve shortness of breath with ADL's Yes    Intervention Provide education, individualized exercise plan and daily activity instruction to help decrease symptoms of SOB with activities of daily living.    Expected Outcomes Short Term: Improve cardiorespiratory fitness to achieve a reduction of symptoms when performing ADLs;Long Term: Be able to perform more ADLs without symptoms or delay the onset of symptoms    Hypertension Yes    Intervention Provide  education on lifestyle modifcations including regular physical activity/exercise, weight management, moderate sodium restriction and increased consumption of fresh fruit, vegetables, and low fat dairy, alcohol moderation, and smoking cessation.;Monitor prescription use compliance.    Expected Outcomes Short Term: Continued assessment and intervention until BP is < 140/69mm HG in hypertensive participants. < 130/58mm HG in hypertensive participants with diabetes, heart failure or chronic kidney disease.;Long Term: Maintenance of blood pressure at goal levels.          Education:Diabetes - Individual verbal and written instruction to review signs/symptoms of diabetes, desired ranges of glucose level fasting, after meals and with exercise. Acknowledge that pre and post exercise glucose checks will be done for 3 sessions at entry of program.   Know Your Numbers and Heart Failure: - Group verbal and visual instruction to discuss disease risk factors for cardiac and pulmonary disease and treatment options.  Reviews associated critical values for Overweight/Obesity, Hypertension, Cholesterol, and Diabetes.  Discusses basics of heart failure: signs/symptoms and treatments.  Introduces Heart Failure Zone chart for action plan for heart failure.  Written material given at graduation.   Core Components/Risk Factors/Patient Goals Review:   Goals and Risk Factor Review     Row Name 01/03/24 1044             Core Components/Risk Factors/Patient  Goals Review   Personal Goals Review Improve shortness of breath with ADL's       Review Spoke to patient about their shortness of breath and what they can do to improve. Patient has been informed of breathing techniques when starting the program. Patient is informed to tell staff if they have had any med changes and that certain meds they are taking or not taking can be causing shortness of breath.       Expected Outcomes Short: Attend [facility] regularly to improve shortness of breath with ADL's. Long: maintain independence with ADL's          Core Components/Risk Factors/Patient Goals at Discharge (Final Review):   Goals and Risk Factor Review - 01/03/24 1044       Core Components/Risk Factors/Patient Goals Review   Personal Goals Review Improve shortness of breath with ADL's    Review Spoke to patient about their shortness of breath and what they can do to improve. Patient has been informed of breathing techniques when starting the program. Patient is informed to tell staff if they have had any med changes and that certain meds they are taking or not taking can be causing shortness of breath.    Expected Outcomes Short: Attend [facility] regularly to improve shortness of breath with ADL's. Long: maintain independence with ADL's          ITP Comments:  ITP Comments     Row Name 12/17/23 1434 12/20/23 1104 12/24/23 1054 01/05/24 0816     ITP Comments Initial phone call completed. Diagnosis can be found in [facility] 5/6. EP Orientation scheduled for Monday 7/14 at 9am. Completed and gym orientation for pulmonary rehab. Initial ITP created and sent for review to Dr. Faud Aleskerov, Medical Director. First full day of exercise!  Patient was oriented to gym and equipment including functions, settings, policies, and procedures.  Patient's individual exercise prescription and treatment plan were reviewed.  All starting workloads were established based on the results of the 6 minute walk test done  at initial orientation visit.  The plan for exercise progression was also introduced and progression will be customized based on patient's performance and goals.  30 Day review completed. Medical Director ITP review done, changes made as directed, and signed approval by Medical Director. New to program.       Comments: 30 day review

## 2024-01-05 NOTE — Progress Notes (Signed)
 Daily Session Note  Patient Details  Name: MAURION WALKOWIAK MRN: [id number] Date of Birth: 1940/06/28 Referring Provider:   Flowsheet Row Pulmonary Rehab from 12/20/2023 in [facility] Cardiac and Pulmonary Rehab  Referring Provider Dr. Halina Picking    Encounter Date: 01/05/2024  Check In:  Session Check In - 01/05/24 1031       Check-In   Supervising physician immediately available to respond to emergencies See telemetry face sheet for immediately available ER MD    Location [facility]-Cardiac & Pulmonary Rehab    Staff Present Burnard Davenport RN,BSN,MPA;Joseph [facility] RCP,RRT,BSRT;Margaret Best, MS, Exercise Physiologist;Jason Elnor RDN,LDN;Noah Tickle, BS, Exercise Physiologist    Virtual Visit No    Medication changes reported     No    Fall or balance concerns reported    No    Tobacco Cessation No Change    Warm-up and Cool-down Performed on first and last piece of equipment    Resistance Training Performed Yes    VAD Patient? No    PAD/SET Patient? No      Pain Assessment   Currently in Pain? No/denies             Social History   Tobacco Use  Smoking Status Former   Current packs/day: 0.00   Types: Cigarettes   Start date: 64   Quit date: 1998   Years since quitting: 27.5  Smokeless Tobacco Never    Goals Met:  Independence with exercise equipment Exercise tolerated well No report of concerns or symptoms today Strength training completed today  Goals Unmet:  Not Applicable  Comments: Pt able to follow exercise prescription today without complaint.  Will continue to monitor for progression.    Dr. Oneil Pinal is Medical Director for [facility] Cardiac Rehabilitation.  Dr. Fuad Aleskerov is Medical Director for [facility] Pulmonary Rehabilitation.

## 2024-01-07 ENCOUNTER — Encounter: Attending: Pulmonary Disease

## 2024-01-07 DIAGNOSIS — J449 Chronic obstructive pulmonary disease, unspecified: Secondary | ICD-10-CM | POA: Insufficient documentation

## 2024-01-07 NOTE — Progress Notes (Signed)
 Daily Session Note  Patient Details  Name: QUASHON JESUS MRN: [id number] Date of Birth: Apr 13, 1941 Referring Provider:   Flowsheet Row Pulmonary Rehab from 12/20/2023 in [facility] Cardiac and Pulmonary Rehab  Referring Provider Dr. Halina Picking    Encounter Date: 01/07/2024  Check In:  Session Check In - 01/07/24 1109       Check-In   Supervising physician immediately available to respond to emergencies See telemetry face sheet for immediately available ER MD    Location [facility]-Cardiac & Pulmonary Rehab    Staff Present Josette Shallow RN,BC,MSN;Maxon [place] BS, Exercise Physiologist;Noah Tickle, BS, Exercise Physiologist;Joseph Rolinda RCP,RRT,BSRT    Virtual Visit No    Medication changes reported     No    Fall or balance concerns reported    No    Warm-up and Cool-down Performed on first and last piece of equipment    Resistance Training Performed Yes    VAD Patient? No    PAD/SET Patient? No      Pain Assessment   Currently in Pain? No/denies    Multiple Pain Sites No             Social History   Tobacco Use  Smoking Status Former   Current packs/day: 0.00   Types: Cigarettes   Start date: 45   Quit date: 1998   Years since quitting: 27.6  Smokeless Tobacco Never    Goals Met:  Independence with exercise equipment Exercise tolerated well No report of concerns or symptoms today Strength training completed today  Goals Unmet:  Not Applicable  Comments: Pt able to follow exercise prescription today without complaint.  Will continue to monitor for progression.    Dr. Oneil Pinal is Medical Director for [facility] Cardiac Rehabilitation.  Dr. Fuad Aleskerov is Medical Director for [facility] Pulmonary Rehabilitation.

## 2024-01-10 ENCOUNTER — Encounter

## 2024-01-10 DIAGNOSIS — J449 Chronic obstructive pulmonary disease, unspecified: Secondary | ICD-10-CM | POA: Diagnosis not present

## 2024-01-10 NOTE — Progress Notes (Signed)
 Daily Session Note  Patient Details  Name: Logan Morgan MRN: [id number] Date of Birth: [birth year] Referring Provider:   Flowsheet Row Pulmonary Rehab from 12/20/2023 in [facility] Cardiac and Pulmonary Rehab  Referring Provider Dr. Halina Picking    Encounter Date: 01/10/2024  Check In:  Session Check In - 01/10/24 1105       Check-In   Supervising physician immediately available to respond to emergencies See telemetry face sheet for immediately available ER MD    Location [facility]-Cardiac & Pulmonary Rehab    Staff Present Burnard Davenport RN,BSN,MPA;Joseph [facility] BS, ACSM CEP, Exercise Physiologist;Laureen Delores, BS, RRT, CPFT    Virtual Visit No    Medication changes reported     No    Fall or balance concerns reported    No    Tobacco Cessation No Change    Warm-up and Cool-down Performed on first and last piece of equipment    Resistance Training Performed Yes    VAD Patient? No    PAD/SET Patient? No      Pain Assessment   Currently in Pain? No/denies             Social History   Tobacco Use  Smoking Status Former   Current packs/day: 0.00   Types: Cigarettes   Start date: 84   Quit date: 1998   Years since quitting: 27.6  Smokeless Tobacco Never    Goals Met:  Independence with exercise equipment Exercise tolerated well No report of concerns or symptoms today Strength training completed today  Goals Unmet:  Not Applicable  Comments: Pt able to follow exercise prescription today without complaint.  Will continue to monitor for progression.    Dr. Oneil Pinal is Medical Director for [facility] Cardiac Rehabilitation.  Dr. Fuad Aleskerov is Medical Director for [facility] Pulmonary Rehabilitation.

## 2024-01-12 ENCOUNTER — Encounter

## 2024-01-12 DIAGNOSIS — J449 Chronic obstructive pulmonary disease, unspecified: Secondary | ICD-10-CM

## 2024-01-12 NOTE — Progress Notes (Signed)
 Daily Session Note  Patient Details  Name: Logan Morgan MRN: [id number] Date of Birth: 1941/05/13 Referring Provider:   Flowsheet Row Pulmonary Rehab from 12/20/2023 in [facility] Cardiac and Pulmonary Rehab  Referring Provider Dr. Halina Picking    Encounter Date: 01/12/2024  Check In:  Session Check In - 01/12/24 1057       Check-In   Supervising physician immediately available to respond to emergencies See telemetry face sheet for immediately available ER MD    Location [facility]-Cardiac & Pulmonary Rehab    Staff Present Burnard Davenport RN,BSN,MPA;Joseph [facility] RCP,RRT,BSRT;Margaret Best, MS, Exercise Physiologist;Jason Elnor RDN,LDN    Virtual Visit No    Medication changes reported     No    Fall or balance concerns reported    No    Tobacco Cessation No Change    Warm-up and Cool-down Performed on first and last piece of equipment    Resistance Training Performed Yes    VAD Patient? No    PAD/SET Patient? No      Pain Assessment   Currently in Pain? No/denies             Social History   Tobacco Use  Smoking Status Former   Current packs/day: 0.00   Types: Cigarettes   Start date: 47   Quit date: 1998   Years since quitting: 27.6  Smokeless Tobacco Never    Goals Met:  Independence with exercise equipment Exercise tolerated well No report of concerns or symptoms today Strength training completed today  Goals Unmet:  Not Applicable  Comments: Pt able to follow exercise prescription today without complaint.  Will continue to monitor for progression.    Dr. Oneil Pinal is Medical Director for [facility] Cardiac Rehabilitation.  Dr. Fuad Aleskerov is Medical Director for [facility] Pulmonary Rehabilitation.

## 2024-01-14 ENCOUNTER — Encounter: Admitting: *Deleted

## 2024-01-14 DIAGNOSIS — J449 Chronic obstructive pulmonary disease, unspecified: Secondary | ICD-10-CM

## 2024-01-14 NOTE — Progress Notes (Signed)
 Daily Session Note  Patient Details  Name: Logan Morgan MRN: [id number] Date of Birth: 03/08/1941 Referring Provider:   Flowsheet Row Pulmonary Rehab from 12/20/2023 in [facility] Cardiac and Pulmonary Rehab  Referring Provider Dr. Halina Picking    Encounter Date: 01/14/2024  Check In:  Session Check In - 01/14/24 1123       Check-In   Supervising physician immediately available to respond to emergencies See telemetry face sheet for immediately available ER MD    Location [facility]-Cardiac & Pulmonary Rehab    Staff Present Maxon Conetta BS, Exercise Physiologist;Cozetta Seif, RN, BSN, CCRP;Meredith Tressa RN,BSN;Joseph [employer]    Virtual Visit No    Medication changes reported     No    Fall or balance concerns reported    No    Warm-up and Cool-down Performed on first and last piece of equipment    Resistance Training Performed Yes    VAD Patient? No    PAD/SET Patient? No      Pain Assessment   Currently in Pain? No/denies             Social History   Tobacco Use  Smoking Status Former   Current packs/day: 0.00   Types: Cigarettes   Start date: 50   Quit date: 1998   Years since quitting: 27.6  Smokeless Tobacco Never    Goals Met:  Proper associated with RPD/PD & O2 Sat Independence with exercise equipment Exercise tolerated well No report of concerns or symptoms today  Goals Unmet:  Not Applicable  Comments: Reviewed RPE and dyspnea scale, THR and program prescription with pt today.  Pt voiced understanding and was given a copy of goals to take home.    Short: Use RPE daily to regulate intensity.  Long: Follow program prescription in THR.    Dr. Oneil Pinal is Medical Director for [facility] Cardiac Rehabilitation.  Dr. Fuad Aleskerov is Medical Director for [facility] Pulmonary Rehabilitation.

## 2024-01-17 ENCOUNTER — Encounter

## 2024-01-17 DIAGNOSIS — J449 Chronic obstructive pulmonary disease, unspecified: Secondary | ICD-10-CM

## 2024-01-17 NOTE — Progress Notes (Signed)
 Daily Session Note  Patient Details  Name: OMRAN KEELIN MRN: [id number] Date of Birth: 1940-08-10 Referring Provider:   Flowsheet Row Pulmonary Rehab from 12/20/2023 in [facility] Cardiac and Pulmonary Rehab  Referring Provider Dr. Halina Picking    Encounter Date: 01/17/2024  Check In:  Session Check In - 01/17/24 1105       Check-In   Supervising physician immediately available to respond to emergencies See telemetry face sheet for immediately available ER MD    Location [facility]-Cardiac & Pulmonary Rehab    Staff Present Burnard Davenport RN,BSN,MPA;Maxon Burnell BS, Exercise Physiologist;Joseph [employer] BS, ACSM CEP, Exercise Physiologist    Virtual Visit No    Medication changes reported     No    Fall or balance concerns reported    No    Tobacco Cessation No Change    Warm-up and Cool-down Performed on first and last piece of equipment    Resistance Training Performed Yes    VAD Patient? No    PAD/SET Patient? No      Pain Assessment   Currently in Pain? No/denies             Social History   Tobacco Use  Smoking Status Former   Current packs/day: 0.00   Types: Cigarettes   Start date: 40   Quit date: 1998   Years since quitting: 27.6  Smokeless Tobacco Never    Goals Met:  Proper associated with RPD/PD & O2 Sat Independence with exercise equipment Using PLB without cueing & demonstrates good technique Exercise tolerated well No report of concerns or symptoms today Strength training completed today  Goals Unmet:  Not Applicable  Comments: Pt able to follow exercise prescription today without complaint.  Will continue to monitor for progression.    Dr. Oneil Pinal is Medical Director for [facility] Cardiac Rehabilitation.  Dr. Fuad Aleskerov is Medical Director for [facility] Pulmonary Rehabilitation.

## 2024-01-19 ENCOUNTER — Encounter: Admitting: Emergency Medicine

## 2024-01-19 DIAGNOSIS — J449 Chronic obstructive pulmonary disease, unspecified: Secondary | ICD-10-CM

## 2024-01-19 NOTE — Progress Notes (Signed)
 Daily Session Note  Patient Details  Name: Logan Morgan MRN: [id number] Date of Birth: 04-24-1941 Referring Provider:   Flowsheet Row Pulmonary Rehab from 12/20/2023 in [facility] Cardiac and Pulmonary Rehab  Referring Provider Dr. Halina Picking    Encounter Date: 01/19/2024  Check In:  Session Check In - 01/19/24 1112       Check-In   Supervising physician immediately available to respond to emergencies See telemetry face sheet for immediately available ER MD    Location [facility]-Cardiac & Pulmonary Rehab    Staff Present Burnard Davenport RN,BSN,MPA;Margaret Best, MS, Exercise Physiologist;Noah Tickle, BS, Exercise Physiologist;Joseph Rolinda RCP,RRT,BSRT    Virtual Visit No    Medication changes reported     No    Fall or balance concerns reported    No    Warm-up and Cool-down Performed on first and last piece of equipment    Resistance Training Performed Yes    VAD Patient? No    PAD/SET Patient? No      Pain Assessment   Currently in Pain? No/denies             Social History   Tobacco Use  Smoking Status Former   Current packs/day: 0.00   Types: Cigarettes   Start date: 10   Quit date: 1998   Years since quitting: 27.6  Smokeless Tobacco Never    Goals Met:  Proper associated with RPD/PD & O2 Sat Independence with exercise equipment Using PLB without cueing & demonstrates good technique Exercise tolerated well No report of concerns or symptoms today Strength training completed today  Goals Unmet:  Not Applicable  Comments: Pt able to follow exercise prescription today without complaint.  Will continue to monitor for progression.    Dr. Oneil Pinal is Medical Director for [facility] Cardiac Rehabilitation.  Dr. Fuad Aleskerov is Medical Director for [facility] Pulmonary Rehabilitation.

## 2024-01-21 ENCOUNTER — Encounter

## 2024-01-21 DIAGNOSIS — J449 Chronic obstructive pulmonary disease, unspecified: Secondary | ICD-10-CM

## 2024-01-21 NOTE — Progress Notes (Signed)
 Daily Session Note  Patient Details  Name: Logan Morgan MRN: [id number] Date of Birth: Jun 16, 1940 Referring Provider:   Flowsheet Row Pulmonary Rehab from 12/20/2023 in [facility] Cardiac and Pulmonary Rehab  Referring Provider Dr. Halina Picking    Encounter Date: 01/21/2024  Check In:  Session Check In - 01/21/24 1102       Check-In   Supervising physician immediately available to respond to emergencies See telemetry face sheet for immediately available ER MD    Location [facility]-Cardiac & Pulmonary Rehab    Staff Present Burnard Davenport RN,BSN,MPA;Joseph Hood RCP,RRT,BSRT;Maxon Conetta BS, Exercise Physiologist;Noah Tickle, BS, Exercise Physiologist    Virtual Visit No    Medication changes reported     No    Fall or balance concerns reported    No    Tobacco Cessation No Change    Warm-up and Cool-down Performed on first and last piece of equipment    Resistance Training Performed Yes    VAD Patient? No    PAD/SET Patient? No      Pain Assessment   Currently in Pain? No/denies             Social History   Tobacco Use  Smoking Status Former   Current packs/day: 0.00   Types: Cigarettes   Start date: 42   Quit date: 1998   Years since quitting: 27.6  Smokeless Tobacco Never    Goals Met:  Proper associated with RPD/PD & O2 Sat Independence with exercise equipment Using PLB without cueing & demonstrates good technique Exercise tolerated well No report of concerns or symptoms today Strength training completed today  Goals Unmet:  Not Applicable  Comments: Pt able to follow exercise prescription today without complaint.  Will continue to monitor for progression.    Dr. Oneil Pinal is Medical Director for [facility] Cardiac Rehabilitation.  Dr. Fuad Aleskerov is Medical Director for [facility] Pulmonary Rehabilitation.

## 2024-01-24 ENCOUNTER — Encounter

## 2024-01-24 DIAGNOSIS — J449 Chronic obstructive pulmonary disease, unspecified: Secondary | ICD-10-CM

## 2024-01-24 NOTE — Progress Notes (Signed)
 Daily Session Note  Patient Details  Name: Logan Morgan MRN: [id number] Date of Birth: May 19, 1941 Referring Provider:   Flowsheet Row Pulmonary Rehab from 12/20/2023 in [facility] Cardiac and Pulmonary Rehab  Referring Provider Dr. Halina Picking    Encounter Date: 01/24/2024  Check In:  Session Check In - 01/24/24 1122       Check-In   Supervising physician immediately available to respond to emergencies See telemetry face sheet for immediately available ER MD    Location [facility]-Cardiac & Pulmonary Rehab    Staff Present Burnard Davenport RN,BSN,MPA;Meredith Tressa RN,BSN;Joseph [facility] Dyane BS, ACSM CEP, Exercise Physiologist;Lauren Cates RN,BSN    Virtual Visit No    Medication changes reported     No    Fall or balance concerns reported    No    Tobacco Cessation No Change    Warm-up and Cool-down Performed on first and last piece of equipment    Resistance Training Performed Yes    VAD Patient? No    PAD/SET Patient? No      Pain Assessment   Currently in Pain? No/denies             Social History   Tobacco Use  Smoking Status Former   Current packs/day: 0.00   Types: Cigarettes   Start date: 75   Quit date: 1998   Years since quitting: 27.6  Smokeless Tobacco Never    Goals Met:  Proper associated with RPD/PD & O2 Sat Independence with exercise equipment Using PLB without cueing & demonstrates good technique Exercise tolerated well No report of concerns or symptoms today Strength training completed today  Goals Unmet:  Not Applicable  Comments: Pt able to follow exercise prescription today without complaint.  Will continue to monitor for progression.    Dr. Oneil Pinal is Medical Director for [facility] Cardiac Rehabilitation.  Dr. Fuad Aleskerov is Medical Director for [facility] Pulmonary Rehabilitation.

## 2024-01-26 ENCOUNTER — Encounter

## 2024-01-26 ENCOUNTER — Ambulatory Visit (INDEPENDENT_AMBULATORY_CARE_PROVIDER_SITE_OTHER): Admitting: Urology

## 2024-01-26 VITALS — BP 106/70 | HR 77 | Wt 159.0 lb

## 2024-01-26 DIAGNOSIS — R339 Retention of urine, unspecified: Secondary | ICD-10-CM | POA: Diagnosis not present

## 2024-01-26 DIAGNOSIS — N3281 Overactive bladder: Secondary | ICD-10-CM

## 2024-01-26 DIAGNOSIS — N2889 Other specified disorders of kidney and ureter: Secondary | ICD-10-CM

## 2024-01-26 DIAGNOSIS — J449 Chronic obstructive pulmonary disease, unspecified: Secondary | ICD-10-CM | POA: Diagnosis not present

## 2024-01-26 LAB — BLADDER SCAN AMB NON-IMAGING: Scan Result: 309

## 2024-01-26 MED ORDER — TROSPIUM CHLORIDE 20 MG PO TABS: 20.0000 mg | ORAL_TABLET | Freq: Two times a day (BID) | ORAL | 11 refills | Status: DC

## 2024-01-26 NOTE — Patient Instructions (Addendum)
 Try to urinate every 2-3 hours during the day even if your bladder does not feel completely full.  This may help prevent the leakage that you are having.  We also prescribed a new medication called trospium  that is an overactive bladder medicine and can help give you more control of your bladder.

## 2024-01-26 NOTE — Progress Notes (Signed)
   01/26/2024 9:55 AM   Logan Morgan [birth year] [id number]  Reason for visit: Follow up urinary symptoms/OAB, right renal mass, microscopic hematuria  HPI: 83 year old male who was previously followed by Dr. Penne for the above issues and transferred his care to me in July 2025 after she left the practice.  He has a history of prostate cancer over 15 years ago treated with brachytherapy, and PSA has remained undetectable.  He has a small 1.4 cm right upper pole renal mass that has been stable on cross-sectional imaging since January 2022 on active surveillance.  Finally, he has urge incontinence that happens 5-10x a month.  Cystoscopy July 2025 was benign, and he opted for trial of Gemtesa.  He denies any improvement on the Sam [place]burn.  He has stable nocturia 0-1 time overnight.  PVR supranormal.  He has been on Flomax  long-term.  We discussed possible etiologies including BPH, OAB, radiation changes, and the challenges of bladder problems in patients in their 80s/90s with history of radiation  He was interested in a trial of trospium .  He is very bothered by his symptoms.  I also recommended timed voiding.  Continue yearly imaging for small right stable renal mass since 2022  Trial of trospium  RTC 6 weeks PVR.  Could consider urodynamics in the future MRI January 2026 for surveillance small renal mass   Logan JAYSON Burnet, MD  [facility] [address], [address] [place], [place] [address] [phone]

## 2024-01-26 NOTE — Progress Notes (Signed)
 Daily Session Note  Patient Details  Name: Logan Morgan MRN: [id number] Date of Birth: May 28, 1941 Referring Provider:   Flowsheet Row Pulmonary Rehab from 12/20/2023 in [facility] Cardiac and Pulmonary Rehab  Referring Provider Dr. Halina Picking    Encounter Date: 01/26/2024  Check In:  Session Check In - 01/26/24 1038       Check-In   Supervising physician immediately available to respond to emergencies See telemetry face sheet for immediately available ER MD    Location [facility]-Cardiac & Pulmonary Rehab    Staff Present Burnard Davenport RN,BSN,MPA;Joseph Rolinda RCP,RRT,BSRT;Jason Elnor RDN,LDN;Laura Cates RN,BSN    Virtual Visit No    Medication changes reported     No    Fall or balance concerns reported    No    Tobacco Cessation No Change    Warm-up and Cool-down Performed on first and last piece of equipment    Resistance Training Performed Yes    VAD Patient? No    PAD/SET Patient? No      Pain Assessment   Currently in Pain? No/denies             Social History   Tobacco Use  Smoking Status Former   Current packs/day: 0.00   Types: Cigarettes   Start date: 44   Quit date: 1998   Years since quitting: 27.6  Smokeless Tobacco Never    Goals Met:  Proper associated with RPD/PD & O2 Sat Independence with exercise equipment Using PLB without cueing & demonstrates good technique Exercise tolerated well No report of concerns or symptoms today Strength training completed today  Goals Unmet:  Not Applicable  Comments: Pt able to follow exercise prescription today without complaint.  Will continue to monitor for progression.    Dr. Oneil Pinal is Medical Director for [facility] Cardiac Rehabilitation.  Dr. Fuad Aleskerov is Medical Director for [facility] Pulmonary Rehabilitation.

## 2024-01-28 ENCOUNTER — Encounter

## 2024-01-28 DIAGNOSIS — J449 Chronic obstructive pulmonary disease, unspecified: Secondary | ICD-10-CM

## 2024-01-28 NOTE — Progress Notes (Signed)
 Daily Session Note  Patient Details  Name: TRAN ARZUAGA MRN: [id number] Date of Birth: 11/16/1940 Referring Provider:   Flowsheet Row Pulmonary Rehab from 12/20/2023 in [facility] Cardiac and Pulmonary Rehab  Referring Provider Dr. Halina Picking    Encounter Date: 01/28/2024  Check In:  Session Check In - 01/28/24 1051       Check-In   Supervising physician immediately available to respond to emergencies See telemetry face sheet for immediately available ER MD    Location [facility]-Cardiac & Pulmonary Rehab    Staff Present Burnard Davenport RN,BSN,MPA;Joseph Hood RCP,RRT,BSRT;Maxon Conetta BS, Exercise Physiologist;Noah Tickle, BS, Exercise Physiologist    Virtual Visit No    Medication changes reported     No    Fall or balance concerns reported    No    Tobacco Cessation No Change    Warm-up and Cool-down Performed on first and last piece of equipment    Resistance Training Performed Yes    VAD Patient? No    PAD/SET Patient? No      Pain Assessment   Currently in Pain? No/denies             Social History   Tobacco Use  Smoking Status Former   Current packs/day: 0.00   Types: Cigarettes   Start date: 82   Quit date: 1998   Years since quitting: 27.6  Smokeless Tobacco Never    Goals Met:  Proper associated with RPD/PD & O2 Sat Independence with exercise equipment Using PLB without cueing & demonstrates good technique Exercise tolerated well No report of concerns or symptoms today Strength training completed today  Goals Unmet:  Not Applicable  Comments: Pt able to follow exercise prescription today without complaint.  Will continue to monitor for progression.    Dr. Oneil Pinal is Medical Director for [facility] Cardiac Rehabilitation.  Dr. Fuad Aleskerov is Medical Director for [facility] Pulmonary Rehabilitation.

## 2024-01-31 ENCOUNTER — Encounter

## 2024-01-31 DIAGNOSIS — J449 Chronic obstructive pulmonary disease, unspecified: Secondary | ICD-10-CM

## 2024-01-31 NOTE — Progress Notes (Signed)
 Daily Session Note  Patient Details  Name: Logan Morgan MRN: [id number] Date of Birth: 06-07-1941 Referring Provider:   Flowsheet Row Pulmonary Rehab from 12/20/2023 in [facility] Cardiac and Pulmonary Rehab  Referring Provider Dr. Halina Picking    Encounter Date: 01/31/2024  Check In:  Session Check In - 01/31/24 1059       Check-In   Supervising physician immediately available to respond to emergencies See telemetry face sheet for immediately available ER MD    Location [facility]-Cardiac & Pulmonary Rehab    Staff Present Burnard Davenport RN,BSN,MPA;Joseph Rolinda RCP,RRT,BSRT;Laura Cates RN,BSN;Milagro Belmares Dyane BS, ACSM CEP, Exercise Physiologist    Virtual Visit No    Medication changes reported     No    Fall or balance concerns reported    No    Tobacco Cessation No Change    Warm-up and Cool-down Performed on first and last piece of equipment    Resistance Training Performed Yes    VAD Patient? No    PAD/SET Patient? No      Pain Assessment   Currently in Pain? No/denies             Social History   Tobacco Use  Smoking Status Former   Current packs/day: 0.00   Types: Cigarettes   Start date: 67   Quit date: 1998   Years since quitting: 27.6  Smokeless Tobacco Never    Goals Met:  Proper associated with RPD/PD & O2 Sat Independence with exercise equipment Using PLB without cueing & demonstrates good technique Exercise tolerated well No report of concerns or symptoms today Strength training completed today  Goals Unmet:  Not Applicable  Comments: Pt able to follow exercise prescription today without complaint.  Will continue to monitor for progression.    Dr. Oneil Pinal is Medical Director for [facility] Cardiac Rehabilitation.  Dr. Fuad Aleskerov is Medical Director for [facility] Pulmonary Rehabilitation.

## 2024-02-02 ENCOUNTER — Encounter

## 2024-02-02 DIAGNOSIS — J449 Chronic obstructive pulmonary disease, unspecified: Secondary | ICD-10-CM

## 2024-02-02 NOTE — Progress Notes (Signed)
 Pulmonary Individual Treatment Plan  Patient Details  Name: Logan Morgan MRN: [id number] Date of Birth: 07/18/1940 Referring Provider:   Flowsheet Row Pulmonary Rehab from 12/20/2023 in [facility] Cardiac and Pulmonary Rehab  Referring Provider Dr. Halina Picking    Initial Encounter Date:  Flowsheet Row Pulmonary Rehab from 12/20/2023 in [facility] Cardiac and Pulmonary Rehab  Date 12/20/23    Visit Diagnosis: Chronic obstructive pulmonary disease, unspecified COPD type (HCC)  Patient's Home Medications on Admission:  Current Outpatient Medications:    albuterol  (VENTOLIN  HFA) 108 (90 Base) MCG/ACT inhaler, Inhale 2 puffs into the lungs every 6 (six) hours as needed for wheezing or shortness of breath. DX: J43.2, Disp: 24 g, Rfl: 2   amiodarone  (PACERONE ) 200 MG tablet, Take 200 mg by mouth daily., Disp: , Rfl:    atorvastatin (LIPITOR) 20 MG tablet, Take 20 mg by mouth daily., Disp: , Rfl:    ciprofloxacin  (CIPRO ) 250 MG tablet, Take 1 tablet (250 mg total) by mouth 2 (two) times daily., Disp: 14 tablet, Rfl: 0   ferrous sulfate 325 (65 FE) MG tablet, Take 325 mg by mouth daily., Disp: , Rfl:    furosemide (LASIX) 20 MG tablet, Take 20 mg by mouth daily., Disp: , Rfl:    glucosamine-chondroitin 500-400 MG tablet, Take 1 tablet by mouth 2 (two) times daily. , Disp: , Rfl:    Glycopyrrolate-Formoterol  (BEVESPI  AEROSPHERE) 9-4.8 MCG/ACT AERO, Inhale 2 puffs into the lungs 2 (two) times daily., Disp: 32.1 g, Rfl: 3   metoprolol  succinate (TOPROL -XL) 25 MG 24 hr tablet, Take 25 mg by mouth daily., Disp: , Rfl:    Multiple Vitamin (ONE-A-DAY MENS PO), Take 1 tablet by mouth daily., Disp: , Rfl:    olmesartan (BENICAR) 40 MG tablet, Take by mouth., Disp: , Rfl:    Omega-3 Fatty Acids (FISH OIL) 1000 MG CAPS, Take by mouth daily., Disp: , Rfl:    potassium chloride  SA (K-DUR,KLOR-CON ) 20 MEQ tablet, Take 1 tablet (20 mEq total) by mouth daily. (Patient taking differently: Take 20 mEq by mouth 2 (two)  times daily.), Disp: 30 tablet, Rfl: 6   pravastatin  (PRAVACHOL ) 40 MG tablet, Take 40 mg by mouth daily., Disp: , Rfl:    rivaroxaban  (XARELTO ) 20 MG TABS tablet, Take 20 mg by mouth daily with supper., Disp: , Rfl:    sertraline  (ZOLOFT ) 100 MG tablet, Take 150 mg by mouth daily., Disp: , Rfl:    tamsulosin  (FLOMAX ) 0.4 MG CAPS capsule, Take 0.4 mg by mouth daily., Disp: , Rfl:    trospium  (SANCTURA ) 20 MG tablet, Take 1 tablet (20 mg total) by mouth 2 (two) times daily., Disp: 60 tablet, Rfl: 11  Past Medical History: Past Medical History:  Diagnosis Date   Atrial fibrillation (HCC)    COPD (chronic obstructive pulmonary disease) (HCC)    Dyspnea    Pt reports due to inactivity   Dysrhythmia    Emphysema lung (HCC)    Prostate cancer (HCC)     Tobacco Use: Social History   Tobacco Use  Smoking Status Former   Current packs/day: 0.00   Types: Cigarettes   Start date: 67   Quit date: 1998   Years since quitting: 27.6  Smokeless Tobacco Never    Labs: Review Flowsheet       Latest Ref Rng & Units 07/29/2016  Labs for ITP Cardiac and Pulmonary Rehab  Cholestrol 0 - 200 mg/dL 834   LDL (calc) 0 - 99 mg/dL 93   HDL-C >  40 mg/dL 51   Trlycerides <849 mg/dL 896      Pulmonary Assessment Scores:  Pulmonary Assessment Scores     Row Name 12/20/23 1113         ADL UCSD   ADL Phase Entry     SOB Score total 11     Rest 0     Walk 2     Stairs 4     Bath 1     Dress 2     Shop 2       CAT Score   CAT Score 18       mMRC Score   mMRC Score 2        UCSD: Self-administered rating of dyspnea associated with activities of daily living (ADLs) 6-point scale (0 = not at all to 5 = maximal or unable to do because of breathlessness)  Scoring Scores range from 0 to 120.  Minimally important difference is 5 units  CAT: CAT can identify the health impairment of COPD patients and is better correlated with disease progression.  CAT has a scoring range of zero  to 40. The CAT score is classified into four groups of low (less than 10), medium (10 - 20), high (21-30) and very high (31-40) based on the impact level of disease on health status. A CAT score over 10 suggests significant symptoms.  A worsening CAT score could be explained by an exacerbation, poor medication adherence, poor inhaler technique, or progression of COPD or comorbid conditions.  CAT MCID is 2 points  mMRC: mMRC (Modified Medical Research Council) Dyspnea Scale is used to assess the degree of baseline functional disability in patients of respiratory disease due to dyspnea. No minimal important difference is established. A decrease in score of 1 point or greater is considered a positive change.   Pulmonary Function Assessment:   Exercise Target Goals: Exercise Program Goal: Individual exercise prescription set using results from initial 6 min walk test and THRR while considering  patient's activity barriers and safety.   Exercise Prescription Goal: Initial exercise prescription builds to 30-45 minutes a day of aerobic activity, 2-3 days per week.  Home exercise guidelines will be given to patient during program as part of exercise prescription that the participant will acknowledge.  Education: Aerobic Exercise: - Group verbal and visual presentation on the components of exercise prescription. Introduces F.I.T.T principle from ACSM for exercise prescriptions.  Reviews F.I.T.T. principles of aerobic exercise including progression. Written material provided at class time.   Education: Resistance Exercise: - Group verbal and visual presentation on the components of exercise prescription. Introduces F.I.T.T principle from ACSM for exercise prescriptions  Reviews F.I.T.T. principles of resistance exercise including progression. Written material provided at class time. Flowsheet Row Pulmonary Rehab from 01/26/2024 in [facility] Cardiac and Pulmonary Rehab  Date 01/05/24  Educator nt   Instruction Review Code 1- TEFL teacher Understanding     Education: Exercise & Equipment Safety: - Individual verbal instruction and demonstration of equipment use and safety with use of the equipment. Flowsheet Row Pulmonary Rehab from 01/26/2024 in [facility] Cardiac and Pulmonary Rehab  Date 12/20/23  Educator [facility]  Instruction Review Code 1- Verbalizes Understanding    Education: Exercise Physiology & General Exercise Guidelines: - Group verbal and written instruction with models to review the exercise physiology of the cardiovascular system and associated critical values. Provides general exercise guidelines with specific guidelines to those with heart or lung disease.  Flowsheet Row Pulmonary Rehab from 01/26/2024 in [facility] Cardiac  and Pulmonary Rehab  Education need identified 12/20/23  Date 12/29/23  Educator nt  Instruction Review Code 1- TEFL teacher Understanding    Education: Flexibility, Balance, Mind/Body Relaxation: - Group verbal and visual presentation with interactive activity on the components of exercise prescription. Introduces F.I.T.T principle from ACSM for exercise prescriptions. Reviews F.I.T.T. principles of flexibility and balance exercise training including progression. Also discusses the mind body connection.  Reviews various relaxation techniques to help reduce and manage stress (i.e. Deep breathing, progressive muscle relaxation, and visualization). Balance handout provided to take home. Written material provided at class time. Flowsheet Row Pulmonary Rehab from 01/26/2024 in [facility] Cardiac and Pulmonary Rehab  Date 01/05/24  Educator nt  Instruction Review Code 1- Verbalizes Understanding    Activity Barriers & Risk Stratification:  Activity Barriers & Cardiac Risk Stratification - 12/20/23 1107       Activity Barriers & Cardiac Risk Stratification   Activity Barriers Muscular Weakness;Shortness of Breath;Balance Concerns          6 Minute Walk:  6 Minute Walk      Row Name 12/20/23 1105         6 Minute Walk   Phase Initial     Distance 1020 feet     Walk Time 6 minutes     # of Rest Breaks 0     MPH 1.93     METS 2.07     RPE 13     Perceived Dyspnea  2     VO2 Peak 7.24     Symptoms No     Resting HR 77 bpm     Resting BP 94/52     Resting Oxygen Saturation  90 %     Exercise Oxygen Saturation  during 6 min walk 86 %     Max Ex. HR 102 bpm     Max Ex. BP 146/56     2 Minute Post BP 104/52       Interval HR   1 Minute HR 95     2 Minute HR 97     3 Minute HR 100     4 Minute HR 101     5 Minute HR 102     6 Minute HR 99     2 Minute Post HR 70     Interval Heart Rate? Yes       Interval Oxygen   Interval Oxygen? Yes     Baseline Oxygen Saturation % 90 %     1 Minute Oxygen Saturation % 92 %     1 Minute Liters of Oxygen 0 L     2 Minute Oxygen Saturation % 90 %     2 Minute Liters of Oxygen 0 L     3 Minute Oxygen Saturation % 88 %     3 Minute Liters of Oxygen 0 L     4 Minute Oxygen Saturation % 87 %     4 Minute Liters of Oxygen 0 L     5 Minute Oxygen Saturation % 87 %     5 Minute Liters of Oxygen 0 L     6 Minute Oxygen Saturation % 86 %     6 Minute Liters of Oxygen 0 L     2 Minute Post Oxygen Saturation % 97 %     2 Minute Post Liters of Oxygen 0 L       Oxygen Initial Assessment:  Oxygen Initial Assessment - 12/17/23 1411  Home Oxygen   Home Oxygen Device None    Sleep Oxygen Prescription Continuous    Home Exercise Oxygen Prescription None   prn   Home Resting Oxygen Prescription None    Compliance with Home Oxygen Use Yes      Intervention   Short Term Goals To learn and understand importance of monitoring SPO2 with pulse oximeter and demonstrate accurate use of the pulse oximeter.;To learn and understand importance of maintaining oxygen saturations>88%;To learn and demonstrate proper pursed lip breathing techniques or other breathing techniques. ;To learn and demonstrate proper use of  respiratory medications    Long  Term Goals Verbalizes importance of monitoring SPO2 with pulse oximeter and return demonstration;Maintenance of O2 saturations>88%;Compliance with respiratory medication;Demonstrates proper use of MDI's;Exhibits proper breathing techniques, such as pursed lip breathing or other method taught during program session          Oxygen Re-Evaluation:  Oxygen Re-Evaluation     Row Name 12/24/23 1055 01/10/24 1129 01/17/24 1110         Program Oxygen Prescription   Program Oxygen Prescription -- None Continuous     Liters per minute -- -- 2     Comments -- -- on Exertion and prn.       Home Oxygen   Home Oxygen Device None None Home Concentrator     Sleep Oxygen Prescription Continuous Continuous Continuous     Liters per minute -- 2  prn 2     Home Exercise Oxygen Prescription None None Continuous     Liters per minute -- -- 2     Home Resting Oxygen Prescription None None None     Compliance with Home Oxygen Use Yes Yes Yes       Goals/Expected Outcomes   Short Term Goals To learn and understand importance of monitoring SPO2 with pulse oximeter and demonstrate accurate use of the pulse oximeter.;To learn and understand importance of maintaining oxygen saturations>88%;To learn and demonstrate proper pursed lip breathing techniques or other breathing techniques. ;To learn and demonstrate proper use of respiratory medications;To learn and exhibit compliance with exercise, home and travel O2 prescription To learn and understand importance of maintaining oxygen saturations>88%;To learn and understand importance of monitoring SPO2 with pulse oximeter and demonstrate accurate use of the pulse oximeter. To learn and demonstrate proper use of respiratory medications;To learn and understand importance of maintaining oxygen saturations>88%     Long  Term Goals Verbalizes importance of monitoring SPO2 with pulse oximeter and return demonstration;Maintenance of O2  saturations>88%;Compliance with respiratory medication;Demonstrates proper use of MDI's;Exhibits proper breathing techniques, such as pursed lip breathing or other method taught during program session;Exhibits compliance with exercise, home  and travel O2 prescription Maintenance of O2 saturations>88%;Verbalizes importance of monitoring SPO2 with pulse oximeter and return demonstration Compliance with respiratory medication;Maintenance of O2 saturations>88%     Comments Reviewed PLB technique with pt.  Talked about how it works and it's importance in maintaining their exercise saturations. Logan Morgan has a pulse oximeter to check his oxygen saturation at home. Informed and explained why it is important to have one. Reviewed that oxygen saturations should be 88 percent and above. Patient verbalizes understanding. Logan Morgan has been desating in rehab when on the treadmill and has been often below 88 percent and gets as low as 85 percent without coming back up unless he is resting. PLB did not bring his oxygen up while on the treadmill. He may not need oxygen on sit down machines and will continue to  monitor.     Goals/Expected Outcomes Short: Become more profiecient at using PLB. Long: Become independent at using PLB. Short: monitor oxygen at home with exertion. Long: maintain oxygen saturations above 88 percent independently. Short: maintain o2 sats above 88 percent on exertion. Long: use o2 as need in rehab and at home.        Oxygen Discharge (Final Oxygen Re-Evaluation):  Oxygen Re-Evaluation - 01/17/24 1110       Program Oxygen Prescription   Program Oxygen Prescription Continuous    Liters per minute 2    Comments on Exertion and prn.      Home Oxygen   Home Oxygen Device Home Concentrator    Sleep Oxygen Prescription Continuous    Liters per minute 2    Home Exercise Oxygen Prescription Continuous    Liters per minute 2    Home Resting Oxygen Prescription None    Compliance with Home Oxygen Use Yes       Goals/Expected Outcomes   Short Term Goals To learn and demonstrate proper use of respiratory medications;To learn and understand importance of maintaining oxygen saturations>88%    Long  Term Goals Compliance with respiratory medication;Maintenance of O2 saturations>88%    Comments Logan Morgan has been desating in rehab when on the treadmill and has been often below 88 percent and gets as low as 85 percent without coming back up unless he is resting. PLB did not bring his oxygen up while on the treadmill. He may not need oxygen on sit down machines and will continue to monitor.    Goals/Expected Outcomes Short: maintain o2 sats above 88 percent on exertion. Long: use o2 as need in rehab and at home.          Initial Exercise Prescription:  Initial Exercise Prescription - 12/20/23 1100       Date of Initial Exercise RX and Referring Provider   Date 12/20/23    Referring Provider Dr. Halina Picking      Oxygen   Oxygen Continuous    Liters 0-1L   As needed   Maintain Oxygen Saturation 88% or higher      Treadmill   MPH 2    Grade 0    Minutes 15    METs 2.53      NuStep   Level 2    SPM 80    Minutes 15    METs 2      Arm Ergometer   Level 1    Watts 25    RPM 25    Minutes 15    METs 2      REL-XR   Level 2    Watts 25    Speed 50    Minutes 15    METs 2      T5 Nustep   Level 2    SPM 80    Minutes 15    METs 2      Track   Laps 27    Minutes 15    METs 2      Prescription Details   Duration Progress to 30 minutes of continuous aerobic without signs/symptoms of physical distress      Intensity   THRR 40-80% of Max Heartrate 101-125    Ratings of Perceived Exertion 11-13    Perceived Dyspnea 0-4      Progression   Progression Continue to progress workloads to maintain intensity without signs/symptoms of physical distress.      Resistance Training  Training Prescription Yes    Weight 5lb    Reps 10-15          Perform Capillary Blood  Glucose checks as needed.  Exercise Prescription Changes:   Exercise Prescription Changes     Row Name 12/20/23 1100 12/29/23 0700 01/13/24 1800 01/27/24 0900       Response to Exercise   Blood Pressure (Admit) 94/52 118/60 116/52 108/54    Blood Pressure (Exercise) 146/56 132/82 156/60 124/60    Blood Pressure (Exit) 104/52 102/62 98/52 112/50    Heart Rate (Admit) 77 bpm 69 bpm 85 bpm 76 bpm    Heart Rate (Exercise) 102 bpm 81 bpm 97 bpm 99 bpm    Heart Rate (Exit) 70 bpm 70 bpm 87 bpm 77 bpm    Oxygen Saturation (Admit) 90 % 92 % 90 % 90 %    Oxygen Saturation (Exercise) 86 % 90 % 87 % 87 %    Oxygen Saturation (Exit) 97 % 92 % 89 % 92 %    Rating of Perceived Exertion (Exercise) 13 15 15 15     Perceived Dyspnea (Exercise) 2 1 3 3     Symptoms SOB none none none    Comments results first 2 weeks of exercise -- --    Duration -- Progress to 30 minutes of  aerobic without signs/symptoms of physical distress Progress to 30 minutes of  aerobic without signs/symptoms of physical distress Progress to 30 minutes of  aerobic without signs/symptoms of physical distress    Intensity -- THRR unchanged THRR unchanged THRR unchanged      Progression   Progression -- Continue to progress workloads to maintain intensity without signs/symptoms of physical distress. Continue to progress workloads to maintain intensity without signs/symptoms of physical distress. Continue to progress workloads to maintain intensity without signs/symptoms of physical distress.    Average METs -- 2.2 2.43 2.4      Resistance Training   Training Prescription -- Yes Yes Yes    Weight -- 5lb 5lb 5lb    Reps -- 10-15 10-15 10-15      Interval Training   Interval Training -- No No No      Oxygen   Oxygen -- Continuous -- Continuous    Liters -- 0-1L  As needed -- 2L      Treadmill   MPH -- -- 2 1.5    Grade -- -- 0 0    Minutes -- -- 15 15    METs -- -- 2.53 2.15      Recumbant Bike   Level -- -- 2 4     RPM -- -- 50 --    Watts -- -- 25 26    Minutes -- -- 15 15    METs -- -- 2.7 3.12      NuStep   Level -- 2 5 4     Minutes -- 15 15 15     METs -- 2.4 2.5 2.2      Arm Ergometer   Level -- 1 1 1     Watts -- 15 15 --    Minutes -- 15 15 15     METs -- 2.09 2.2 2.09      REL-XR   Level -- -- 2 3    Minutes -- -- 15 15      T5 Nustep   Level -- -- 3 --    Minutes -- -- 15 --    METs -- -- 2.2 --  Oxygen   Maintain Oxygen Saturation -- 88% or higher 88% or higher 88% or higher       Exercise Comments:   Exercise Comments     Row Name 12/24/23 1054           Exercise Comments First full day of exercise!  Patient was oriented to gym and equipment including functions, settings, policies, and procedures.  Patient's individual exercise prescription and treatment plan were reviewed.  All starting workloads were established based on the results of the 6 minute walk test done at initial orientation visit.  The plan for exercise progression was also introduced and progression will be customized based on patient's performance and goals.          Exercise Goals and Review:   Exercise Goals     Row Name 12/20/23 1111             Exercise Goals   Increase Physical Activity Yes       Intervention Provide advice, education, support and counseling about physical activity/exercise needs.;Develop an individualized exercise prescription for aerobic and resistive training based on initial evaluation findings, risk stratification, comorbidities and participant's personal goals.       Expected Outcomes Short Term: Attend rehab on a regular basis to increase amount of physical activity.;Long Term: Add in home exercise to make exercise part of routine and to increase amount of physical activity.;Long Term: Exercising regularly at least 3-5 days a week.       Increase Strength and Stamina Yes       Intervention Provide advice, education, support and counseling about physical  activity/exercise needs.;Develop an individualized exercise prescription for aerobic and resistive training based on initial evaluation findings, risk stratification, comorbidities and participant's personal goals.       Expected Outcomes Short Term: Increase workloads from initial exercise prescription for resistance, speed, and METs.;Long Term: Improve cardiorespiratory fitness, muscular endurance and strength as measured by increased METs and functional capacity ( );Short Term: Perform resistance training exercises routinely during rehab and add in resistance training at home       Able to understand and use rate of perceived exertion (RPE) scale Yes       Intervention Provide education and explanation on how to use RPE scale       Expected Outcomes Short Term: Able to use RPE daily in rehab to express subjective intensity level;Long Term:  Able to use RPE to guide intensity level when exercising independently       Able to understand and use Dyspnea scale Yes       Intervention Provide education and explanation on how to use Dyspnea scale       Expected Outcomes Short Term: Able to use Dyspnea scale daily in rehab to express subjective sense of shortness of breath during exertion;Long Term: Able to use Dyspnea scale to guide intensity level when exercising independently       Knowledge and understanding of Target Heart Rate Range (THRR) Yes       Intervention Provide education and explanation of THRR including how the numbers were predicted and where they are located for reference       Expected Outcomes Short Term: Able to state/look up THRR;Short Term: Able to use daily as guideline for intensity in rehab;Long Term: Able to use THRR to govern intensity when exercising independently       Able to check pulse independently Yes       Intervention Provide education and demonstration on how to  check pulse in carotid and radial arteries.;Review the importance of being able to check your own pulse for  safety during independent exercise       Expected Outcomes Short Term: Able to explain why pulse checking is important during independent exercise;Long Term: Able to check pulse independently and accurately       Understanding of Exercise Prescription Yes       Intervention Provide education, explanation, and written materials on patient's individual exercise prescription       Expected Outcomes Short Term: Able to explain program exercise prescription;Long Term: Able to explain home exercise prescription to exercise independently          Exercise Goals Re-Evaluation :  Exercise Goals Re-Evaluation     Row Name 12/24/23 1055 12/29/23 0747 01/13/24 1809 01/27/24 0929       Exercise Goal Re-Evaluation   Exercise Goals Review Increase Physical Activity;Able to understand and use rate of perceived exertion (RPE) scale;Knowledge and understanding of Target Heart Rate Range (THRR);Understanding of Exercise Prescription;Increase Strength and Stamina;Able to understand and use Dyspnea scale;Able to check pulse independently Increase Physical Activity;Increase Strength and Stamina;Understanding of Exercise Prescription Increase Physical Activity;Increase Strength and Stamina;Understanding of Exercise Prescription Increase Physical Activity;Increase Strength and Stamina;Understanding of Exercise Prescription    Comments Reviewed RPE and dyspnea scale, THR and program prescription with pt today.  Pt voiced understanding and was given a copy of goals to take home. Logan Morgan is doing well in rehab. He was able to attend his first and only session during this review period. During his one session he was able to use the T4 nustep at level 2, and the arm ergometer at level 1. We will continue to monitor his progress in the program. Logan Morgan is doing well in rehab. He increased his level on the T4 nustep to 5 and increased to level 3 on the T5 nustep. He added the recumbent bike at level 2. He maintained level 2 on the XR  and maintained a speed of 2 mph with no incline on the treadmill. We will continue to monitor his progress in the program. Logan Morgan continues to do well in rehab. He was recently able to increase from level 2 to 4 on the recumbent bike. He was also able to increase from level 2 to 3 on the XR. We will continue to monitor his progress in the program.    Expected Outcomes Short: Use RPE daily to regulate intensity. Long: Follow program prescription in THR. Short: Continue to follow exercise prescription. Long: Continue exercise to improve strength and stamina. Short: Continue to progressively increase treadmill workload. Long: Continue exercise to improve strength and stamina. Short: Continue to follow exercise prescription. Long: Continue exercise to improve strength and stamina.       Discharge Exercise Prescription (Final Exercise Prescription Changes):  Exercise Prescription Changes - 01/27/24 0900       Response to Exercise   Blood Pressure (Admit) 108/54    Blood Pressure (Exercise) 124/60    Blood Pressure (Exit) 112/50    Heart Rate (Admit) 76 bpm    Heart Rate (Exercise) 99 bpm    Heart Rate (Exit) 77 bpm    Oxygen Saturation (Admit) 90 %    Oxygen Saturation (Exercise) 87 %    Oxygen Saturation (Exit) 92 %    Rating of Perceived Exertion (Exercise) 15    Perceived Dyspnea (Exercise) 3    Symptoms none    Duration Progress to 30 minutes of  aerobic without signs/symptoms  of physical distress    Intensity THRR unchanged      Progression   Progression Continue to progress workloads to maintain intensity without signs/symptoms of physical distress.    Average METs 2.4      Resistance Training   Training Prescription Yes    Weight 5lb    Reps 10-15      Interval Training   Interval Training No      Oxygen   Oxygen Continuous    Liters 2L      Treadmill   MPH 1.5    Grade 0    Minutes 15    METs 2.15      Recumbant Bike   Level 4    Watts 26    Minutes 15    METs 3.12       NuStep   Level 4    Minutes 15    METs 2.2      Arm Ergometer   Level 1    Minutes 15    METs 2.09      REL-XR   Level 3    Minutes 15      Oxygen   Maintain Oxygen Saturation 88% or higher          Nutrition:  Target Goals: Understanding of nutrition guidelines, daily intake of sodium 1500mg , cholesterol 200mg , calories 30% from fat and 7% or less from saturated fats, daily to have 5 or more servings of fruits and vegetables.  Education: Nutrition 1 -Group instruction provided by verbal, written material, interactive activities, discussions, models, and posters to present general guidelines for heart healthy nutrition including macronutrients, label reading, and promoting whole foods over processed counterparts. Education serves as Pensions consultant of discussion of heart healthy eating for all. Written material provided at class time.     Education: Nutrition 2 -Group instruction provided by verbal, written material, interactive activities, discussions, models, and posters to present general guidelines for heart healthy nutrition including sodium, cholesterol, and saturated fat. Providing guidance of habit forming to improve blood pressure, cholesterol, and body weight. Written material provided at class time. Flowsheet Row Pulmonary Rehab from 01/26/2024 in [facility] Cardiac and Pulmonary Rehab  Date 01/26/24  Educator jg  Instruction Review Code 1- Verbalizes Understanding      Biometrics:  Pre Biometrics - 12/20/23 1112       Pre Biometrics   Height 5' 8.5 (1.74 m)    Weight 159 lb 6.4 oz (72.3 kg)    Waist Circumference 35.5 inches    Hip Circumference 39 inches    Waist to Hip Ratio 0.91 %    BMI (Calculated) 23.88    Single Leg Stand 3 seconds           Nutrition Therapy Plan and Nutrition Goals:  Nutrition Therapy & Goals - 01/03/24 1038       Nutrition Therapy   Diet Cardiac, Low Na    Protein (specify units) 70-90    Fiber 30 grams    Whole  Grain Foods 3 servings    Saturated Fats 15 max. grams    Fruits and Vegetables 5 servings/day    Sodium 2 grams      Personal Nutrition Goals   Nutrition Goal Read labels and reduce sodium intake to below 2300mg . Ideally 1500mg  per day.    Personal Goal #2 Eat 15-30gProtein and 30-60gCarbs at each meal.    Personal Goal #3 Reduce saturated fat, less than 12g per day. Replace bad fats for more heart healthy  fats.    Comments Patient eats 2 meals per day mostly. He likes veggies and tries to include them at most of his meals. He uses salt at some of his meals. Spoke with him about cutting back on sodium, with goal of less than 1500mg  per day, or a less strict goal of less than 2300mg  per day. Reviewed Mediterranean diet handout. Educated on types of fats, sources, and how to read facts labels. Provided goal of less than 12g of saturated fat per day. Brainstormed a few meals and snacks with foods he likes and will eat.      Intervention Plan   Intervention Prescribe, educate and counsel regarding individualized specific dietary modifications aiming towards targeted core components such as weight, hypertension, lipid management, diabetes, heart failure and other comorbidities.;Nutrition handout(s) given to patient.    Expected Outcomes Short Term Goal: Understand basic principles of dietary content, such as calories, fat, sodium, cholesterol and nutrients.;Short Term Goal: A plan has been developed with personal nutrition goals set during dietitian appointment.;Long Term Goal: Adherence to prescribed nutrition plan.          Nutrition Assessments:  MEDIFICTS Score Key: >=70 Need to make dietary changes  40-70 Heart Healthy Diet <= 40 Therapeutic Level Cholesterol Diet  Flowsheet Row Pulmonary Rehab from 12/20/2023 in [facility] Cardiac and Pulmonary Rehab  Picture Your Plate Total Score on Admission 61   Picture Your Plate Scores: <59 Unhealthy dietary pattern with much room for  improvement. 41-50 Dietary pattern unlikely to meet recommendations for good health and room for improvement. 51-60 More healthful dietary pattern, with some room for improvement.  >60 Healthy dietary pattern, although there may be some specific behaviors that could be improved.   Nutrition Goals Re-Evaluation:  Nutrition Goals Re-Evaluation     Row Name 01/10/24 1126             Goals   Comment Patient was informed on why it is important to maintain a balanced diet when dealing with Respiratory issues. Explained that it takes a lot of energy to breath and when they are short of breath often they will need to have a good diet to help keep up with the calories they are expending for breathing.       Expected Outcome Short: Choose and plan snacks accordingly to patients caloric intake to improve breathing. Long: Maintain a diet independently that meets their caloric intake to aid in daily shortness of breath.          Nutrition Goals Discharge (Final Nutrition Goals Re-Evaluation):  Nutrition Goals Re-Evaluation - 01/10/24 1126       Goals   Comment Patient was informed on why it is important to maintain a balanced diet when dealing with Respiratory issues. Explained that it takes a lot of energy to breath and when they are short of breath often they will need to have a good diet to help keep up with the calories they are expending for breathing.    Expected Outcome Short: Choose and plan snacks accordingly to patients caloric intake to improve breathing. Long: Maintain a diet independently that meets their caloric intake to aid in daily shortness of breath.          Psychosocial: Target Goals: Acknowledge presence or absence of significant depression and/or stress, maximize coping skills, provide positive support system. Participant is able to verbalize types and ability to use techniques and skills needed for reducing stress and depression.   Education: Stress, Anxiety, and  Depression -  Group verbal and visual presentation to define topics covered.  Reviews how body is impacted by stress, anxiety, and depression.  Also discusses healthy ways to reduce stress and to treat/manage anxiety and depression.  Written material provided at class time.   Education: Sleep Hygiene -Provides group verbal and written instruction about how sleep can affect your health.  Define sleep hygiene, discuss sleep cycles and impact of sleep habits. Review good sleep hygiene tips.    Initial Review & Psychosocial Screening:  Initial Psych Review & Screening - 12/17/23 1427       Initial Review   Current issues with Current Stress Concerns;History of Depression    Source of Stress Concerns Family      Family Dynamics   Good Support System? Yes      Barriers   Psychosocial barriers to participate in program There are no identifiable barriers or psychosocial needs.;The patient should benefit from training in stress management and relaxation.      Screening Interventions   Interventions Encouraged to exercise;To provide support and resources with identified psychosocial needs;Provide feedback about the scores to participant    Expected Outcomes Short Term goal: Utilizing psychosocial counselor, staff and physician to assist with identification of specific Stressors or current issues interfering with healing process. Setting desired goal for each stressor or current issue identified.;Long Term Goal: Stressors or current issues are controlled or eliminated.;Short Term goal: Identification and review with participant of any Quality of Life or Depression concerns found by scoring the questionnaire.;Long Term goal: The participant improves quality of Life and PHQ9 Scores as seen by post scores and/or verbalization of changes          Quality of Life Scores:  Scores of 19 and below usually indicate a poorer quality of life in these areas.  A difference of  2-3 points is a clinically  meaningful difference.  A difference of 2-3 points in the total score of the Quality of Life Index has been associated with significant improvement in overall quality of life, self-image, physical symptoms, and general health in studies assessing change in quality of life.  PHQ-9: Review Flowsheet       12/20/2023  Depression screen PHQ 2/9  Decreased Interest 0  Down, Depressed, Hopeless 0  PHQ - 2 Score 0  Altered sleeping 0  Tired, decreased energy 1  Change in appetite 0  Feeling bad or failure about yourself  0  Trouble concentrating 0  Moving slowly or fidgety/restless 0  Suicidal thoughts 1  PHQ-9 Score 2  Difficult doing work/chores Not difficult at all   Interpretation of Total Score  Total Score Depression Severity:  1-4 = Minimal depression, 5-9 = Mild depression, 10-14 = Moderate depression, 15-19 = Moderately severe depression, 20-27 = Severe depression   Psychosocial Evaluation and Intervention:  Psychosocial Evaluation - 12/17/23 1427       Psychosocial Evaluation & Interventions   Interventions Encouraged to exercise with the program and follow exercise prescription;Relaxation education;Stress management education    Comments Logan Morgan is coming to pulmonary rehab with COPD. He notes when he gets very short of breath and/or anxious he loses control of his bladder, so he tries to schedule his activities to prevent that form happening if possible. He is following with a urologist for this issue. When asked about stress, he mentions that his wife has been in the memory care unit for 3 years and he visits almost daily. He also states that he has never been the same  mentally since his son and daughter-in-law were murdered during the night  while Logan Morgan and his wife were staying at their house. His wife and he followed with a therapist for years and he mentions he knows life will never be the same. He wants to keep up his independence and work on his stamina while in  the program.    Expected Outcomes Short: attend cardiac rehab for education and exercise Long: develop and maintain positive self care habits.    Continue Psychosocial Services  Follow up required by staff          Psychosocial Re-Evaluation:  Psychosocial Re-Evaluation     Row Name 01/03/24 1045 01/10/24 1127           Psychosocial Re-Evaluation   Current issues with None Identified None Identified      Comments Patient reports no issues with their current mental states, sleep, stress, depression or anxiety. Will follow up with patient in a few weeks for any changes. Patient reports no issues with their current mental states, sleep, stress, depression or anxiety. Will follow up with patient in a few weeks for any changes.      Expected Outcomes Short: Continue to exercise regularly to support mental health and notify staff of any changes. Long: maintain mental health and well being through teaching of rehab or prescribed medications independently. Short: Continue to exercise regularly to support mental health and notify staff of any changes. Long: maintain mental health and well being through teaching of rehab or prescribed medications independently.      Interventions Encouraged to attend Pulmonary Rehabilitation for the exercise Encouraged to attend Pulmonary Rehabilitation for the exercise      Continue Psychosocial Services  Follow up required by staff Follow up required by staff         Psychosocial Discharge (Final Psychosocial Re-Evaluation):  Psychosocial Re-Evaluation - 01/10/24 1127       Psychosocial Re-Evaluation   Current issues with None Identified    Comments Patient reports no issues with their current mental states, sleep, stress, depression or anxiety. Will follow up with patient in a few weeks for any changes.    Expected Outcomes Short: Continue to exercise regularly to support mental health and notify staff of any changes. Long: maintain mental health and well  being through teaching of rehab or prescribed medications independently.    Interventions Encouraged to attend Pulmonary Rehabilitation for the exercise    Continue Psychosocial Services  Follow up required by staff          Education: Education Goals: Education classes will be provided on a weekly basis, covering required topics. Participant will state understanding/return demonstration of topics presented.  Learning Barriers/Preferences:  Learning Barriers/Preferences - 12/17/23 1414       Learning Barriers/Preferences   Learning Barriers None    Learning Preferences None          General Pulmonary Education Topics:  Infection Prevention: - Provides verbal and written material to individual with discussion of infection control including proper hand washing and proper equipment cleaning during exercise session. Flowsheet Row Pulmonary Rehab from 01/26/2024 in [facility] Cardiac and Pulmonary Rehab  Date 12/20/23  Educator [facility]  Instruction Review Code 1- Verbalizes Understanding    Falls Prevention: - Provides verbal and written material to individual with discussion of falls prevention and safety. Flowsheet Row Pulmonary Rehab from 01/26/2024 in [facility] Cardiac and Pulmonary Rehab  Date 12/20/23  Educator [facility]  Instruction Review Code 1- Verbalizes Understanding  Chronic Lung Disease Review: - Group verbal instruction with posters, models, PowerPoint presentations and videos,  to review new updates, new respiratory medications, new advancements in procedures and treatments. Providing information on websites and 800 numbers for continued self-education. Includes information about supplement oxygen, available portable oxygen systems, continuous and intermittent flow rates, oxygen safety, concentrators, and Medicare reimbursement for oxygen. Explanation of Pulmonary Drugs, including class, frequency, complications, importance of spacers, rinsing mouth after steroid MDI's, and proper  cleaning methods for nebulizers. Review of basic lung anatomy and physiology related to function, structure, and complications of lung disease. Review of risk factors. Discussion about methods for diagnosing sleep apnea and types of masks and machines for OSA. Includes a review of the use of types of environmental controls: home humidity, furnaces, filters, dust mite/pet prevention, HEPA vacuums. Discussion about weather changes, air quality and the benefits of nasal washing. Instruction on Warning signs, infection symptoms, calling MD promptly, preventive modes, and value of vaccinations. Review of effective airway clearance, coughing and/or vibration techniques. Emphasizing that all should Create an Action Plan. Written material provided at class time. Flowsheet Row Pulmonary Rehab from 01/26/2024 in [facility] Cardiac and Pulmonary Rehab  Education need identified 12/20/23    AED/CPR: - Group verbal and written instruction with the use of models to demonstrate the basic use of the AED with the basic ABC's of resuscitation.    Tests and Procedures:  - Group verbal and visual presentation and models provide information about basic cardiac anatomy and function. Reviews the testing methods done to diagnose heart disease and the outcomes of the test results. Describes the treatment choices: Medical Management, Angioplasty, or Coronary Bypass Surgery for treating various heart conditions including Myocardial Infarction, Angina, Valve Disease, and Cardiac Arrhythmias.  Written material provided at class time.   Medication Safety: - Group verbal and visual instruction to review commonly prescribed medications for heart and lung disease. Reviews the medication, class of the drug, and side effects. Includes the steps to properly store meds and maintain the prescription regimen.  Written material given at graduation.   Other: -Provides group and verbal instruction on various topics (see comments)   Knowledge  Questionnaire Score:  Knowledge Questionnaire Score - 12/20/23 1118       Knowledge Questionnaire Score   Pre Score 14/18           Core Components/Risk Factors/Patient Goals at Admission:  Personal Goals and Risk Factors at Admission - 12/20/23 1118       Core Components/Risk Factors/Patient Goals on Admission   Improve shortness of breath with ADL's Yes    Intervention Provide education, individualized exercise plan and daily activity instruction to help decrease symptoms of SOB with activities of daily living.    Expected Outcomes Short Term: Improve cardiorespiratory fitness to achieve a reduction of symptoms when performing ADLs;Long Term: Be able to perform more ADLs without symptoms or delay the onset of symptoms    Hypertension Yes    Intervention Provide education on lifestyle modifcations including regular physical activity/exercise, weight management, moderate sodium restriction and increased consumption of fresh fruit, vegetables, and low fat dairy, alcohol moderation, and smoking cessation.;Monitor prescription use compliance.    Expected Outcomes Short Term: Continued assessment and intervention until BP is < 140/6mm HG in hypertensive participants. < 130/59mm HG in hypertensive participants with diabetes, heart failure or chronic kidney disease.;Long Term: Maintenance of blood pressure at goal levels.          Education:Diabetes - Individual verbal and written instruction  to review signs/symptoms of diabetes, desired ranges of glucose level fasting, after meals and with exercise. Acknowledge that pre and post exercise glucose checks will be done for 3 sessions at entry of program.   Know Your Numbers and Heart Failure: - Group verbal and visual instruction to discuss disease risk factors for cardiac and pulmonary disease and treatment options.  Reviews associated critical values for Overweight/Obesity, Hypertension, Cholesterol, and Diabetes.  Discusses basics of heart  failure: signs/symptoms and treatments.  Introduces Heart Failure Zone chart for action plan for heart failure. Written material provided at class time.   Core Components/Risk Factors/Patient Goals Review:   Goals and Risk Factor Review     Row Name 01/03/24 1044 01/10/24 1134           Core Components/Risk Factors/Patient Goals Review   Personal Goals Review Improve shortness of breath with ADL's Other      Review Spoke to patient about their shortness of breath and what they can do to improve. Patient has been informed of breathing techniques when starting the program. Patient is informed to tell staff if they have had any med changes and that certain meds they are taking or not taking can be causing shortness of breath. Diaphragmatic and PLB breathing explained and performed with patient. Patient has a better understanding of how to do these exercises to help with breathing performance and relaxation. Patient performed breathing techniques adequately and to practice further at home.      Expected Outcomes Short: Attend [facility] regularly to improve shortness of breath with ADL's. Long: maintain independence with ADL's Diaphragmatic and PLB breathing explained and performed with patient. Patient has a better understanding of how to do these exercises to help with breathing performance and relaxation. Patient performed breathing techniques adequately and to practice further at home.         Core Components/Risk Factors/Patient Goals at Discharge (Final Review):   Goals and Risk Factor Review - 01/10/24 1134       Core Components/Risk Factors/Patient Goals Review   Personal Goals Review Other    Review Diaphragmatic and PLB breathing explained and performed with patient. Patient has a better understanding of how to do these exercises to help with breathing performance and relaxation. Patient performed breathing techniques adequately and to practice further at home.    Expected Outcomes  Diaphragmatic and PLB breathing explained and performed with patient. Patient has a better understanding of how to do these exercises to help with breathing performance and relaxation. Patient performed breathing techniques adequately and to practice further at home.          ITP Comments:  ITP Comments     Row Name 12/17/23 1434 12/20/23 1104 12/24/23 1054 01/05/24 0816 01/17/24 1109   ITP Comments Initial phone call completed. Diagnosis can be found in [facility] 5/6. EP Orientation scheduled for Monday 7/14 at 9am. Completed and gym orientation for pulmonary rehab. Initial ITP created and sent for review to Dr. Faud Aleskerov, Medical Director. First full day of exercise!  Patient was oriented to gym and equipment including functions, settings, policies, and procedures.  Patient's individual exercise prescription and treatment plan were reviewed.  All starting workloads were established based on the results of the 6 minute walk test done at initial orientation visit.  The plan for exercise progression was also introduced and progression will be customized based on patient's performance and goals. 30 Day review completed. Medical Director ITP review done, changes made as directed, and signed approval  by Medical Director. New to program. Placed [place] on oxygen today since he was not feeling well and his oxygen has been dipping into the mid 80s the last few sessions.    Row Name 02/02/24 0850           ITP Comments 30 Day review completed. Medical Director ITP review done; changes made as directed and signed approval by Medical Director.          Comments: 30 day review

## 2024-02-04 ENCOUNTER — Encounter

## 2024-02-04 DIAGNOSIS — J449 Chronic obstructive pulmonary disease, unspecified: Secondary | ICD-10-CM | POA: Diagnosis not present

## 2024-02-04 NOTE — Progress Notes (Signed)
 Daily Session Note  Patient Details  Name: Logan Morgan MRN: [id number] Date of Birth: 02-Aug-1940 Referring Provider:   Flowsheet Row Pulmonary Rehab from 12/20/2023 in [facility] Cardiac and Pulmonary Rehab  Referring Provider Dr. Halina Picking    Encounter Date: 02/04/2024  Check In:  Session Check In - 02/04/24 1116       Check-In   Supervising physician immediately available to respond to emergencies See telemetry face sheet for immediately available ER MD    Location [facility]-Cardiac & Pulmonary Rehab    Staff Present Burnard Davenport RN,BSN,MPA;Joseph Rolinda RCP,RRT,BSRT;Noah Tickle, [place], Exercise Physiologist    Virtual Visit No    Medication changes reported     No    Fall or balance concerns reported    No    Tobacco Cessation No Change    Warm-up and Cool-down Performed on first and last piece of equipment    Resistance Training Performed Yes    VAD Patient? No    PAD/SET Patient? No      Pain Assessment   Currently in Pain? No/denies             Social History   Tobacco Use  Smoking Status Former   Current packs/day: 0.00   Types: Cigarettes   Start date: 51   Quit date: 1998   Years since quitting: 27.6  Smokeless Tobacco Never    Goals Met:  Proper associated with RPD/PD & O2 Sat Independence with exercise equipment Using PLB without cueing & demonstrates good technique Exercise tolerated well No report of concerns or symptoms today Strength training completed today  Goals Unmet:  Not Applicable  Comments: Pt able to follow exercise prescription today without complaint.  Will continue to monitor for progression.    Dr. Oneil Pinal is Medical Director for [facility] Cardiac Rehabilitation.  Dr. Fuad Aleskerov is Medical Director for [facility] Pulmonary Rehabilitation.

## 2024-02-09 ENCOUNTER — Encounter: Attending: Pulmonary Disease | Admitting: *Deleted

## 2024-02-09 DIAGNOSIS — J449 Chronic obstructive pulmonary disease, unspecified: Secondary | ICD-10-CM | POA: Diagnosis present

## 2024-02-09 NOTE — Progress Notes (Signed)
 Daily Session Note  Patient Details  Name: Logan Morgan MRN: [id number] Date of Birth: 1941-02-03 Referring Provider:   Flowsheet Row Pulmonary Rehab from 12/20/2023 in [facility] Cardiac and Pulmonary Rehab  Referring Provider Dr. Halina Picking    Encounter Date: 02/09/2024  Check In:  Session Check In - 02/09/24 1034       Check-In   Supervising physician immediately available to respond to emergencies See telemetry face sheet for immediately available ER MD    Location [facility]-Cardiac & Pulmonary Rehab    Staff Present Hoy Rodney RN,BSN;Joseph Rolinda RCP,RRT,BSRT;Laura Cates RN,BSN;Kelly Dyane BS, ACSM CEP, Exercise Physiologist    Virtual Visit No    Medication changes reported     No    Fall or balance concerns reported    No    Warm-up and Cool-down Performed on first and last piece of equipment    Resistance Training Performed Yes    VAD Patient? No    PAD/SET Patient? No      Pain Assessment   Currently in Pain? No/denies             Social History   Tobacco Use  Smoking Status Former   Current packs/day: 0.00   Types: Cigarettes   Start date: 41   Quit date: 1998   Years since quitting: 27.6  Smokeless Tobacco Never    Goals Met:  Independence with exercise equipment Exercise tolerated well No report of concerns or symptoms today Strength training completed today  Goals Unmet:  Not Applicable  Comments: Pt able to follow exercise prescription today without complaint.  Will continue to monitor for progression.  Reviewed home exercise with pt today from 11:30am to 11:40am.  Pt plans to walk for exercise.  Reviewed THR, pulse, RPE, sign and symptoms, pulse oximetery and when to call 911 or MD.  Also discussed weather considerations and indoor options.  Pt voiced understanding.     Dr. Oneil Pinal is Medical Director for [facility] Cardiac Rehabilitation.  Dr. Fuad Aleskerov is Medical Director for [facility] Pulmonary Rehabilitation.

## 2024-02-11 ENCOUNTER — Encounter: Admitting: *Deleted

## 2024-02-11 DIAGNOSIS — J449 Chronic obstructive pulmonary disease, unspecified: Secondary | ICD-10-CM

## 2024-02-11 NOTE — Progress Notes (Signed)
 Daily Session Note  Patient Details  Name: Logan Morgan MRN: [id number] Date of Birth: [birth year] Referring Provider:   Flowsheet Row Pulmonary Rehab from 12/20/2023 in [facility] Cardiac and Pulmonary Rehab  Referring Provider Dr. Halina Picking    Encounter Date: 02/11/2024  Check In:  Session Check In - 02/11/24 1056       Check-In   Supervising physician immediately available to respond to emergencies See telemetry face sheet for immediately available ER MD    Location [facility]-Cardiac & Pulmonary Rehab    Staff Present Hoy Rodney RN,BSN;Noah Tickle, BS, Exercise Physiologist;Maxon Conetta BS, Exercise Physiologist    Virtual Visit No    Medication changes reported     No    Fall or balance concerns reported    No    Warm-up and Cool-down Performed on first and last piece of equipment    Resistance Training Performed Yes    VAD Patient? No    PAD/SET Patient? No      Pain Assessment   Currently in Pain? No/denies             Social History   Tobacco Use  Smoking Status Former   Current packs/day: 0.00   Types: Cigarettes   Start date: 2   Quit date: 1998   Years since quitting: 27.6  Smokeless Tobacco Never    Goals Met:  Independence with exercise equipment Exercise tolerated well No report of concerns or symptoms today Strength training completed today  Goals Unmet:  Not Applicable  Comments: Pt able to follow exercise prescription today without complaint.  Will continue to monitor for progression.    Dr. Oneil Pinal is Medical Director for [facility] Cardiac Rehabilitation.  Dr. Fuad Aleskerov is Medical Director for [facility] Pulmonary Rehabilitation.

## 2024-02-14 ENCOUNTER — Encounter

## 2024-02-14 DIAGNOSIS — J449 Chronic obstructive pulmonary disease, unspecified: Secondary | ICD-10-CM | POA: Diagnosis not present

## 2024-02-14 NOTE — Progress Notes (Signed)
 Daily Session Note  Patient Details  Name: Logan Morgan MRN: [id number] Date of Birth: [birth year] Referring Provider:   Flowsheet Row Pulmonary Rehab from 12/20/2023 in [facility] Cardiac and Pulmonary Rehab  Referring Provider Dr. Halina Picking    Encounter Date: 02/14/2024  Check In:  Session Check In - 02/14/24 1142       Check-In   Supervising physician immediately available to respond to emergencies See telemetry face sheet for immediately available ER MD    Location [facility]-Cardiac & Pulmonary Rehab    Staff Present Burnard Davenport RN,BSN,MPA;Joseph Rolinda RCP,RRT,BSRT;Laura Cates RN,BSN;Daviel Allegretto Dyane BS, ACSM CEP, Exercise Physiologist;Jason Elnor RDN,LDN    Virtual Visit No    Medication changes reported     No    Fall or balance concerns reported    No    Tobacco Cessation No Change    Warm-up and Cool-down Performed on first and last piece of equipment    Resistance Training Performed Yes    VAD Patient? No    PAD/SET Patient? No      Pain Assessment   Currently in Pain? No/denies             Social History   Tobacco Use  Smoking Status Former   Current packs/day: 0.00   Types: Cigarettes   Start date: 9   Quit date: 1998   Years since quitting: 27.7  Smokeless Tobacco Never    Goals Met:  Proper associated with RPD/PD & O2 Sat Independence with exercise equipment Using PLB without cueing & demonstrates good technique Exercise tolerated well No report of concerns or symptoms today Strength training completed today  Goals Unmet:  Not Applicable  Comments: Pt able to follow exercise prescription today without complaint.  Will continue to monitor for progression.    Dr. Oneil Pinal is Medical Director for [facility] Cardiac Rehabilitation.  Dr. Fuad Aleskerov is Medical Director for [facility] Pulmonary Rehabilitation.

## 2024-02-16 ENCOUNTER — Encounter: Admitting: Emergency Medicine

## 2024-02-16 DIAGNOSIS — J449 Chronic obstructive pulmonary disease, unspecified: Secondary | ICD-10-CM | POA: Diagnosis not present

## 2024-02-16 NOTE — Progress Notes (Signed)
 Daily Session Note  Patient Details  Name: MAXIMUS HOFFERT MRN: [id number] Date of Birth: 08-15-1940 Referring Provider:   Flowsheet Row Pulmonary Rehab from 12/20/2023 in [facility] Cardiac and Pulmonary Rehab  Referring Provider Dr. Halina Picking    Encounter Date: 02/16/2024  Check In:  Session Check In - 02/16/24 1203       Check-In   Supervising physician immediately available to respond to emergencies See telemetry face sheet for immediately available ER MD    Location [facility]-Cardiac & Pulmonary Rehab    Staff Present Rollene Paterson, MS, Exercise Physiologist;Joseph Rolinda RCP,RRT,BSRT;Kitti Mcclish RN,BSN;Maxon [place] BS, Exercise Physiologist    Virtual Visit No    Medication changes reported     No    Fall or balance concerns reported    No    Tobacco Cessation No Change    Warm-up and Cool-down Performed on first and last piece of equipment    Resistance Training Performed Yes    VAD Patient? No    PAD/SET Patient? No      Pain Assessment   Currently in Pain? No/denies             Social History   Tobacco Use  Smoking Status Former   Current packs/day: 0.00   Types: Cigarettes   Start date: 10   Quit date: 1998   Years since quitting: 27.7  Smokeless Tobacco Never    Goals Met:  Proper associated with RPD/PD & O2 Sat Independence with exercise equipment Using PLB without cueing & demonstrates good technique Exercise tolerated well No report of concerns or symptoms today Strength training completed today  Goals Unmet:  Not Applicable  Comments: Pt able to follow exercise prescription today without complaint.  Will continue to monitor for progression.    Dr. Oneil Pinal is Medical Director for [facility] Cardiac Rehabilitation.  Dr. Fuad Aleskerov is Medical Director for [facility] Pulmonary Rehabilitation.

## 2024-02-18 ENCOUNTER — Encounter

## 2024-02-18 DIAGNOSIS — J449 Chronic obstructive pulmonary disease, unspecified: Secondary | ICD-10-CM | POA: Diagnosis not present

## 2024-02-18 NOTE — Progress Notes (Signed)
 Daily Session Note  Patient Details  Name: REON HUNLEY MRN: [id number] Date of Birth: 1940-11-02 Referring Provider:   Flowsheet Row Pulmonary Rehab from 12/20/2023 in [facility] Cardiac and Pulmonary Rehab  Referring Provider Dr. Halina Picking    Encounter Date: 02/18/2024  Check In:  Session Check In - 02/18/24 1057       Check-In   Supervising physician immediately available to respond to emergencies See telemetry face sheet for immediately available ER MD    Location [facility]-Cardiac & Pulmonary Rehab    Staff Present Burnard Davenport RN,BSN,MPA;Joseph [place] RCP,RRT,BSRT;Maxon Conetta BS, Exercise Physiologist;Noah Tickle, BS, Exercise Physiologist    Virtual Visit No    Medication changes reported     No    Fall or balance concerns reported    No    Tobacco Cessation No Change    Warm-up and Cool-down Performed on first and last piece of equipment    Resistance Training Performed Yes    VAD Patient? No    PAD/SET Patient? No      Pain Assessment   Currently in Pain? No/denies             Social History   Tobacco Use  Smoking Status Former   Current packs/day: 0.00   Types: Cigarettes   Start date: 25   Quit date: 1998   Years since quitting: 27.7  Smokeless Tobacco Never    Goals Met:  Proper associated with RPD/PD & O2 Sat Independence with exercise equipment Using PLB without cueing & demonstrates good technique Exercise tolerated well No report of concerns or symptoms today Strength training completed today  Goals Unmet:  Not Applicable  Comments: Pt able to follow exercise prescription today without complaint.  Will continue to monitor for progression.    Dr. Oneil Pinal is Medical Director for [facility] Cardiac Rehabilitation.  Dr. Fuad Aleskerov is Medical Director for [facility] Pulmonary Rehabilitation.

## 2024-02-21 ENCOUNTER — Encounter

## 2024-02-21 DIAGNOSIS — J449 Chronic obstructive pulmonary disease, unspecified: Secondary | ICD-10-CM | POA: Diagnosis not present

## 2024-02-21 NOTE — Progress Notes (Signed)
 Daily Session Note  Patient Details  Name: JOSIEL GAHM MRN: [id number] Date of Birth: 07/14/1940 Referring Provider:   Flowsheet Row Pulmonary Rehab from 12/20/2023 in [facility] Cardiac and Pulmonary Rehab  Referring Provider Dr. Halina Picking    Encounter Date: 02/21/2024  Check In:  Session Check In - 02/21/24 1115       Check-In   Supervising physician immediately available to respond to emergencies See telemetry face sheet for immediately available ER MD    Location [facility]-Cardiac & Pulmonary Rehab    Staff Present Burnard Davenport RN,BSN,MPA;Joseph Rolinda RCP,RRT,BSRT;Laura Cates RN,BSN;Rakeisha Nyce Dyane BS, ACSM CEP, Exercise Physiologist    Virtual Visit No    Medication changes reported     No    Fall or balance concerns reported    No    Tobacco Cessation No Change    Warm-up and Cool-down Performed on first and last piece of equipment    Resistance Training Performed Yes    VAD Patient? No    PAD/SET Patient? No      Pain Assessment   Currently in Pain? No/denies             Social History   Tobacco Use  Smoking Status Former   Current packs/day: 0.00   Types: Cigarettes   Start date: 41   Quit date: 1998   Years since quitting: 27.7  Smokeless Tobacco Never    Goals Met:  Proper associated with RPD/PD & O2 Sat Independence with exercise equipment Using PLB without cueing & demonstrates good technique Exercise tolerated well No report of concerns or symptoms today Strength training completed today  Goals Unmet:  Not Applicable  Comments: Pt able to follow exercise prescription today without complaint.  Will continue to monitor for progression.    Dr. Oneil Pinal is Medical Director for [facility] Cardiac Rehabilitation.  Dr. Fuad Aleskerov is Medical Director for [facility] Pulmonary Rehabilitation.

## 2024-02-23 ENCOUNTER — Encounter

## 2024-02-23 DIAGNOSIS — J449 Chronic obstructive pulmonary disease, unspecified: Secondary | ICD-10-CM

## 2024-02-23 NOTE — Progress Notes (Signed)
 Daily Session Note  Patient Details  Name: Logan Morgan MRN: [id number] Date of Birth: [birth year] Referring Provider:   Flowsheet Row Pulmonary Rehab from 12/20/2023 in [facility] Cardiac and Pulmonary Rehab  Referring Provider Dr. Halina Picking    Encounter Date: 02/23/2024  Check In:  Session Check In - 02/23/24 1046       Check-In   Supervising physician immediately available to respond to emergencies See telemetry face sheet for immediately available ER MD    Location [facility]-Cardiac & Pulmonary Rehab    Staff Present Burnard Davenport RN,BSN,MPA;Joseph [facility] RCP,RRT,BSRT;Margaret Best, MS, Exercise Physiologist;Jason Elnor RDN,LDN    Virtual Visit No    Medication changes reported     No    Fall or balance concerns reported    No    Tobacco Cessation No Change    Warm-up and Cool-down Performed on first and last piece of equipment    Resistance Training Performed Yes    VAD Patient? No    PAD/SET Patient? No      Pain Assessment   Currently in Pain? No/denies             Social History   Tobacco Use  Smoking Status Former   Current packs/day: 0.00   Types: Cigarettes   Start date: 46   Quit date: 1998   Years since quitting: 27.7  Smokeless Tobacco Never    Goals Met:  Independence with exercise equipment Exercise tolerated well No report of concerns or symptoms today Strength training completed today  Goals Unmet:  Not Applicable  Comments: Pt able to follow exercise prescription today without complaint.  Will continue to monitor for progression.    Dr. Oneil Pinal is Medical Director for [facility] Cardiac Rehabilitation.  Dr. Fuad Aleskerov is Medical Director for [facility] Pulmonary Rehabilitation.

## 2024-02-25 ENCOUNTER — Encounter: Admitting: Emergency Medicine

## 2024-02-25 DIAGNOSIS — J449 Chronic obstructive pulmonary disease, unspecified: Secondary | ICD-10-CM

## 2024-02-25 NOTE — Progress Notes (Signed)
 Daily Session Note  Patient Details  Name: Logan Morgan MRN: [id number] Date of Birth: 12-08-1940 Referring Provider:   Flowsheet Row Pulmonary Rehab from 12/20/2023 in [facility] Cardiac and Pulmonary Rehab  Referring Provider Dr. Halina Picking    Encounter Date: 02/25/2024  Check In:  Session Check In - 02/25/24 1119       Check-In   Supervising physician immediately available to respond to emergencies See telemetry face sheet for immediately available ER MD    Location [facility]-Cardiac & Pulmonary Rehab    Staff Present Rollene Paterson, MS, Exercise Physiologist;Noah Tickle, BS, Exercise Physiologist;Joseph Rolinda RCP,RRT,BSRT;Michoel Kunin RN,BSN    Virtual Visit No    Medication changes reported     No    Fall or balance concerns reported    No    Tobacco Cessation No Change    Warm-up and Cool-down Performed on first and last piece of equipment    Resistance Training Performed Yes    VAD Patient? No    PAD/SET Patient? No      Pain Assessment   Currently in Pain? No/denies             Social History   Tobacco Use  Smoking Status Former   Current packs/day: 0.00   Types: Cigarettes   Start date: 34   Quit date: 1998   Years since quitting: 27.7  Smokeless Tobacco Never    Goals Met:  Proper associated with RPD/PD & O2 Sat Independence with exercise equipment Using PLB without cueing & demonstrates good technique Exercise tolerated well No report of concerns or symptoms today Strength training completed today  Goals Unmet:  Not Applicable  Comments: Pt able to follow exercise prescription today without complaint.  Will continue to monitor for progression.    Dr. Oneil Pinal is Medical Director for [facility] Cardiac Rehabilitation.  Dr. Fuad Aleskerov is Medical Director for [facility] Pulmonary Rehabilitation.

## 2024-02-28 ENCOUNTER — Encounter

## 2024-02-28 DIAGNOSIS — J449 Chronic obstructive pulmonary disease, unspecified: Secondary | ICD-10-CM | POA: Diagnosis not present

## 2024-02-28 NOTE — Progress Notes (Signed)
 Daily Session Note  Patient Details  Name: Logan Morgan MRN: [id number] Date of Birth: 01-28-1941 Referring Provider:   Flowsheet Row Pulmonary Rehab from 12/20/2023 in [facility] Cardiac and Pulmonary Rehab  Referring Provider Dr. Halina Picking    Encounter Date: 02/28/2024  Check In:  Session Check In - 02/28/24 1149       Check-In   Supervising physician immediately available to respond to emergencies See telemetry face sheet for immediately available ER MD    Location [facility]-Cardiac & Pulmonary Rehab    Staff Present Burnard Davenport RN,BSN,MPA;Laura Cates RN,BSN;Joseph [facility] BS, ACSM CEP, Exercise Physiologist;Noah Tickle, BS, Exercise Physiologist    Virtual Visit No    Medication changes reported     No    Fall or balance concerns reported    No    Tobacco Cessation No Change    Warm-up and Cool-down Performed on first and last piece of equipment    Resistance Training Performed Yes    VAD Patient? No    PAD/SET Patient? No      Pain Assessment   Currently in Pain? No/denies             Social History   Tobacco Use  Smoking Status Former   Current packs/day: 0.00   Types: Cigarettes   Start date: 1   Quit date: 1998   Years since quitting: 27.7  Smokeless Tobacco Never    Goals Met:  Proper associated with RPD/PD & O2 Sat Independence with exercise equipment Using PLB without cueing & demonstrates good technique Exercise tolerated well No report of concerns or symptoms today Strength training completed today  Goals Unmet:  Not Applicable  Comments: Pt able to follow exercise prescription today without complaint.  Will continue to monitor for progression.    Dr. Oneil Pinal is Medical Director for [facility] Cardiac Rehabilitation.  Dr. Fuad Aleskerov is Medical Director for [facility] Pulmonary Rehabilitation.

## 2024-03-01 ENCOUNTER — Encounter

## 2024-03-01 ENCOUNTER — Encounter: Payer: Self-pay | Admitting: *Deleted

## 2024-03-01 DIAGNOSIS — J449 Chronic obstructive pulmonary disease, unspecified: Secondary | ICD-10-CM

## 2024-03-01 NOTE — Progress Notes (Signed)
 Pulmonary Individual Treatment Plan  Patient Details  Name: Logan Morgan MRN: [id number] Date of Birth: 11-18-1940 Referring Provider:   Flowsheet Row Pulmonary Rehab from 12/20/2023 in [facility] Cardiac and Pulmonary Rehab  Referring Provider Dr. Halina Picking    Initial Encounter Date:  Flowsheet Row Pulmonary Rehab from 12/20/2023 in [facility] Cardiac and Pulmonary Rehab  Date 12/20/23    Visit Diagnosis: Chronic obstructive pulmonary disease, unspecified COPD type (HCC)  Patient's Home Medications on Admission:  Current Outpatient Medications:    albuterol  (VENTOLIN  HFA) 108 (90 Base) MCG/ACT inhaler, Inhale 2 puffs into the lungs every 6 (six) hours as needed for wheezing or shortness of breath. DX: J43.2, Disp: 24 g, Rfl: 2   amiodarone  (PACERONE ) 200 MG tablet, Take 200 mg by mouth daily., Disp: , Rfl:    atorvastatin (LIPITOR) 20 MG tablet, Take 20 mg by mouth daily., Disp: , Rfl:    ciprofloxacin  (CIPRO ) 250 MG tablet, Take 1 tablet (250 mg total) by mouth 2 (two) times daily., Disp: 14 tablet, Rfl: 0   ferrous sulfate 325 (65 FE) MG tablet, Take 325 mg by mouth daily., Disp: , Rfl:    furosemide (LASIX) 20 MG tablet, Take 20 mg by mouth daily., Disp: , Rfl:    glucosamine-chondroitin 500-400 MG tablet, Take 1 tablet by mouth 2 (two) times daily. , Disp: , Rfl:    Glycopyrrolate-Formoterol  (BEVESPI  AEROSPHERE) 9-4.8 MCG/ACT AERO, Inhale 2 puffs into the lungs 2 (two) times daily., Disp: 32.1 g, Rfl: 3   metoprolol  succinate (TOPROL -XL) 25 MG 24 hr tablet, Take 25 mg by mouth daily., Disp: , Rfl:    Multiple Vitamin (ONE-A-DAY MENS PO), Take 1 tablet by mouth daily., Disp: , Rfl:    olmesartan (BENICAR) 40 MG tablet, Take by mouth., Disp: , Rfl:    Omega-3 Fatty Acids (FISH OIL) 1000 MG CAPS, Take by mouth daily., Disp: , Rfl:    potassium chloride  SA (K-DUR,KLOR-CON ) 20 MEQ tablet, Take 1 tablet (20 mEq total) by mouth daily. (Patient taking differently: Take 20 mEq by mouth 2 (two)  times daily.), Disp: 30 tablet, Rfl: 6   pravastatin  (PRAVACHOL ) 40 MG tablet, Take 40 mg by mouth daily., Disp: , Rfl:    rivaroxaban  (XARELTO ) 20 MG TABS tablet, Take 20 mg by mouth daily with supper., Disp: , Rfl:    sertraline  (ZOLOFT ) 100 MG tablet, Take 150 mg by mouth daily., Disp: , Rfl:    tamsulosin  (FLOMAX ) 0.4 MG CAPS capsule, Take 0.4 mg by mouth daily., Disp: , Rfl:    trospium  (SANCTURA ) 20 MG tablet, Take 1 tablet (20 mg total) by mouth 2 (two) times daily., Disp: 60 tablet, Rfl: 11  Past Medical History: Past Medical History:  Diagnosis Date   Atrial fibrillation (HCC)    COPD (chronic obstructive pulmonary disease) (HCC)    Dyspnea    Pt reports due to inactivity   Dysrhythmia    Emphysema lung (HCC)    Prostate cancer (HCC)     Tobacco Use: Social History   Tobacco Use  Smoking Status Former   Current packs/day: 0.00   Types: Cigarettes   Start date: 37   Quit date: 1998   Years since quitting: 27.7  Smokeless Tobacco Never    Labs: Review Flowsheet       Latest Ref Rng & Units 07/29/2016  Labs for ITP Cardiac and Pulmonary Rehab  Cholestrol 0 - 200 mg/dL 834   LDL (calc) 0 - 99 mg/dL 93   HDL-C >  40 mg/dL 51   Trlycerides <849 mg/dL 896      Pulmonary Assessment Scores:  Pulmonary Assessment Scores     Row Name 12/20/23 1113         ADL UCSD   ADL Phase Entry     SOB Score total 11     Rest 0     Walk 2     Stairs 4     Bath 1     Dress 2     Shop 2       CAT Score   CAT Score 18       mMRC Score   mMRC Score 2        UCSD: Self-administered rating of dyspnea associated with activities of daily living (ADLs) 6-point scale (0 = not at all to 5 = maximal or unable to do because of breathlessness)  Scoring Scores range from 0 to 120.  Minimally important difference is 5 units  CAT: CAT can identify the health impairment of COPD patients and is better correlated with disease progression.  CAT has a scoring range of zero  to 40. The CAT score is classified into four groups of low (less than 10), medium (10 - 20), high (21-30) and very high (31-40) based on the impact level of disease on health status. A CAT score over 10 suggests significant symptoms.  A worsening CAT score could be explained by an exacerbation, poor medication adherence, poor inhaler technique, or progression of COPD or comorbid conditions.  CAT MCID is 2 points  mMRC: mMRC (Modified Medical Research Council) Dyspnea Scale is used to assess the degree of baseline functional disability in patients of respiratory disease due to dyspnea. No minimal important difference is established. A decrease in score of 1 point or greater is considered a positive change.   Pulmonary Function Assessment:   Exercise Target Goals: Exercise Program Goal: Individual exercise prescription set using results from initial 6 min walk test and THRR while considering  patient's activity barriers and safety.   Exercise Prescription Goal: Initial exercise prescription builds to 30-45 minutes a day of aerobic activity, 2-3 days per week.  Home exercise guidelines will be given to patient during program as part of exercise prescription that the participant will acknowledge.  Education: Aerobic Exercise: - Group verbal and visual presentation on the components of exercise prescription. Introduces F.I.T.T principle from ACSM for exercise prescriptions.  Reviews F.I.T.T. principles of aerobic exercise including progression. Written material provided at class time.   Education: Resistance Exercise: - Group verbal and visual presentation on the components of exercise prescription. Introduces F.I.T.T principle from ACSM for exercise prescriptions  Reviews F.I.T.T. principles of resistance exercise including progression. Written material provided at class time. Flowsheet Row Pulmonary Rehab from 03/01/2024 in [facility] Cardiac and Pulmonary Rehab  Date 01/05/24  Educator nt   Instruction Review Code 1- TEFL teacher Understanding     Education: Exercise & Equipment Safety: - Individual verbal instruction and demonstration of equipment use and safety with use of the equipment. Flowsheet Row Pulmonary Rehab from 03/01/2024 in [facility] Cardiac and Pulmonary Rehab  Date 12/20/23  Educator [facility]  Instruction Review Code 1- Verbalizes Understanding    Education: Exercise Physiology & General Exercise Guidelines: - Group verbal and written instruction with models to review the exercise physiology of the cardiovascular system and associated critical values. Provides general exercise guidelines with specific guidelines to those with heart or lung disease.  Flowsheet Row Pulmonary Rehab from 03/01/2024 in [facility] Cardiac  and Pulmonary Rehab  Education need identified 12/20/23  Date 12/29/23  Educator nt  Instruction Review Code 1- TEFL teacher Understanding    Education: Flexibility, Balance, Mind/Body Relaxation: - Group verbal and visual presentation with interactive activity on the components of exercise prescription. Introduces F.I.T.T principle from ACSM for exercise prescriptions. Reviews F.I.T.T. principles of flexibility and balance exercise training including progression. Also discusses the mind body connection.  Reviews various relaxation techniques to help reduce and manage stress (i.e. Deep breathing, progressive muscle relaxation, and visualization). Balance handout provided to take home. Written material provided at class time. Flowsheet Row Pulmonary Rehab from 03/01/2024 in [facility] Cardiac and Pulmonary Rehab  Date 01/05/24  Educator nt  Instruction Review Code 1- Verbalizes Understanding    Activity Barriers & Risk Stratification:  Activity Barriers & Cardiac Risk Stratification - 12/20/23 1107       Activity Barriers & Cardiac Risk Stratification   Activity Barriers Muscular Weakness;Shortness of Breath;Balance Concerns          6 Minute Walk:  6 Minute Walk      Row Name 12/20/23 1105         6 Minute Walk   Phase Initial     Distance 1020 feet     Walk Time 6 minutes     # of Rest Breaks 0     MPH 1.93     METS 2.07     RPE 13     Perceived Dyspnea  2     VO2 Peak 7.24     Symptoms No     Resting HR 77 bpm     Resting BP 94/52     Resting Oxygen Saturation  90 %     Exercise Oxygen Saturation  during 6 min walk 86 %     Max Ex. HR 102 bpm     Max Ex. BP 146/56     2 Minute Post BP 104/52       Interval HR   1 Minute HR 95     2 Minute HR 97     3 Minute HR 100     4 Minute HR 101     5 Minute HR 102     6 Minute HR 99     2 Minute Post HR 70     Interval Heart Rate? Yes       Interval Oxygen   Interval Oxygen? Yes     Baseline Oxygen Saturation % 90 %     1 Minute Oxygen Saturation % 92 %     1 Minute Liters of Oxygen 0 L     2 Minute Oxygen Saturation % 90 %     2 Minute Liters of Oxygen 0 L     3 Minute Oxygen Saturation % 88 %     3 Minute Liters of Oxygen 0 L     4 Minute Oxygen Saturation % 87 %     4 Minute Liters of Oxygen 0 L     5 Minute Oxygen Saturation % 87 %     5 Minute Liters of Oxygen 0 L     6 Minute Oxygen Saturation % 86 %     6 Minute Liters of Oxygen 0 L     2 Minute Post Oxygen Saturation % 97 %     2 Minute Post Liters of Oxygen 0 L       Oxygen Initial Assessment:  Oxygen Initial Assessment - 12/17/23 1411  Home Oxygen   Home Oxygen Device None    Sleep Oxygen Prescription Continuous    Home Exercise Oxygen Prescription None   prn   Home Resting Oxygen Prescription None    Compliance with Home Oxygen Use Yes      Intervention   Short Term Goals To learn and understand importance of monitoring SPO2 with pulse oximeter and demonstrate accurate use of the pulse oximeter.;To learn and understand importance of maintaining oxygen saturations>88%;To learn and demonstrate proper pursed lip breathing techniques or other breathing techniques. ;To learn and demonstrate proper use of  respiratory medications    Long  Term Goals Verbalizes importance of monitoring SPO2 with pulse oximeter and return demonstration;Maintenance of O2 saturations>88%;Compliance with respiratory medication;Demonstrates proper use of MDI's;Exhibits proper breathing techniques, such as pursed lip breathing or other method taught during program session          Oxygen Re-Evaluation:  Oxygen Re-Evaluation     Row Name 12/24/23 1055 01/10/24 1129 01/17/24 1110 02/28/24 1116       Program Oxygen Prescription   Program Oxygen Prescription -- None Continuous Continuous    Liters per minute -- -- 2 2    Comments -- -- on Exertion and prn. on Exertion and prn.      Home Oxygen   Home Oxygen Device None None Home Concentrator Home Concentrator    Sleep Oxygen Prescription Continuous Continuous Continuous Continuous    Liters per minute -- 2  prn 2 2    Home Exercise Oxygen Prescription None None Continuous Continuous    Liters per minute -- -- 2 2    Home Resting Oxygen Prescription None None None None    Compliance with Home Oxygen Use Yes Yes Yes Yes      Goals/Expected Outcomes   Short Term Goals To learn and understand importance of monitoring SPO2 with pulse oximeter and demonstrate accurate use of the pulse oximeter.;To learn and understand importance of maintaining oxygen saturations>88%;To learn and demonstrate proper pursed lip breathing techniques or other breathing techniques. ;To learn and demonstrate proper use of respiratory medications;To learn and exhibit compliance with exercise, home and travel O2 prescription To learn and understand importance of maintaining oxygen saturations>88%;To learn and understand importance of monitoring SPO2 with pulse oximeter and demonstrate accurate use of the pulse oximeter. To learn and demonstrate proper use of respiratory medications;To learn and understand importance of maintaining oxygen saturations>88% Other    Long  Term Goals Verbalizes  importance of monitoring SPO2 with pulse oximeter and return demonstration;Maintenance of O2 saturations>88%;Compliance with respiratory medication;Demonstrates proper use of MDI's;Exhibits proper breathing techniques, such as pursed lip breathing or other method taught during program session;Exhibits compliance with exercise, home  and travel O2 prescription Maintenance of O2 saturations>88%;Verbalizes importance of monitoring SPO2 with pulse oximeter and return demonstration Compliance with respiratory medication;Maintenance of O2 saturations>88% Other    Comments Reviewed PLB technique with pt.  Talked about how it works and it's importance in maintaining their exercise saturations. Darnell has a pulse oximeter to check his oxygen saturation at home. Informed and explained why it is important to have one. Reviewed that oxygen saturations should be 88 percent and above. Patient verbalizes understanding. Eloise has been desating in rehab when on the treadmill and has been often below 88 percent and gets as low as 85 percent without coming back up unless he is resting. PLB did not bring his oxygen up while on the treadmill. He may not need oxygen on sit down machines  and will continue to monitor. Informed Chester that he should bee 88 percent and above with his oxygen when he exercises post [employer]. Explained how he should use an RPE scale to determine how hard he is working along with his heart rate.    Goals/Expected Outcomes Short: Become more profiecient at using PLB. Long: Become independent at using PLB. Short: monitor oxygen at home with exertion. Long: maintain oxygen saturations above 88 percent independently. Short: maintain o2 sats above 88 percent on exertion. Long: use o2 as need in rehab and at home. Short: continue to monitor vitals post [employer]. Long: maintain oxygen and exrcise independently.       Oxygen Discharge (Final Oxygen Re-Evaluation):  Oxygen Re-Evaluation - 02/28/24 1116        Program Oxygen Prescription   Program Oxygen Prescription Continuous    Liters per minute 2    Comments on Exertion and prn.      Home Oxygen   Home Oxygen Device Home Concentrator    Sleep Oxygen Prescription Continuous    Liters per minute 2    Home Exercise Oxygen Prescription Continuous    Liters per minute 2    Home Resting Oxygen Prescription None    Compliance with Home Oxygen Use Yes      Goals/Expected Outcomes   Short Term Goals Other    Long  Term Goals Other    Comments Informed Sherry that he should bee 88 percent and above with his oxygen when he exercises post [employer]. Explained how he should use an RPE scale to determine how hard he is working along with his heart rate.    Goals/Expected Outcomes Short: continue to monitor vitals post [employer]. Long: maintain oxygen and exrcise independently.          Initial Exercise Prescription:  Initial Exercise Prescription - 12/20/23 1100       Date of Initial Exercise RX and Referring Provider   Date 12/20/23    Referring Provider Dr. Halina Picking      Oxygen   Oxygen Continuous    Liters 0-1L   As needed   Maintain Oxygen Saturation 88% or higher      Treadmill   MPH 2    Grade 0    Minutes 15    METs 2.53      NuStep   Level 2    SPM 80    Minutes 15    METs 2      Arm Ergometer   Level 1    Watts 25    RPM 25    Minutes 15    METs 2      REL-XR   Level 2    Watts 25    Speed 50    Minutes 15    METs 2      T5 Nustep   Level 2    SPM 80    Minutes 15    METs 2      Track   Laps 27    Minutes 15    METs 2      Prescription Details   Duration Progress to 30 minutes of continuous aerobic without signs/symptoms of physical distress      Intensity   THRR 40-80% of Max Heartrate 101-125    Ratings of Perceived Exertion 11-13    Perceived Dyspnea 0-4      Progression   Progression Continue to progress workloads to maintain intensity without signs/symptoms of physical distress.  Resistance Training   Training Prescription Yes    Weight 5lb    Reps 10-15          Perform Capillary Blood Glucose checks as needed.  Exercise Prescription Changes:   Exercise Prescription Changes     Row Name 12/20/23 1100 12/29/23 0700 01/13/24 1800 01/27/24 0900 02/09/24 1200     Response to Exercise   Blood Pressure (Admit) 94/52 118/60 116/52 108/54 --   Blood Pressure (Exercise) 146/56 132/82 156/60 124/60 --   Blood Pressure (Exit) 104/52 102/62 98/52 112/50 --   Heart Rate (Admit) 77 bpm 69 bpm 85 bpm 76 bpm --   Heart Rate (Exercise) 102 bpm 81 bpm 97 bpm 99 bpm --   Heart Rate (Exit) 70 bpm 70 bpm 87 bpm 77 bpm --   Oxygen Saturation (Admit) 90 % 92 % 90 % 90 % --   Oxygen Saturation (Exercise) 86 % 90 % 87 % 87 % --   Oxygen Saturation (Exit) 97 % 92 % 89 % 92 % --   Rating of Perceived Exertion (Exercise) 13 15 15 15  --   Perceived Dyspnea (Exercise) 2 1 3 3  --   Symptoms SOB none none none --   Comments results first 2 weeks of exercise -- -- --   Duration -- Progress to 30 minutes of  aerobic without signs/symptoms of physical distress Progress to 30 minutes of  aerobic without signs/symptoms of physical distress Progress to 30 minutes of  aerobic without signs/symptoms of physical distress Progress to 30 minutes of  aerobic without signs/symptoms of physical distress   Intensity -- THRR unchanged THRR unchanged THRR unchanged THRR unchanged     Progression   Progression -- Continue to progress workloads to maintain intensity without signs/symptoms of physical distress. Continue to progress workloads to maintain intensity without signs/symptoms of physical distress. Continue to progress workloads to maintain intensity without signs/symptoms of physical distress. Continue to progress workloads to maintain intensity without signs/symptoms of physical distress.   Average METs -- 2.2 2.43 2.4 2.4     Resistance Training   Training Prescription -- Yes Yes Yes  Yes   Weight -- 5lb 5lb 5lb 5lb   Reps -- 10-15 10-15 10-15 10-15     Interval Training   Interval Training -- No No No No     Oxygen   Oxygen -- Continuous -- Continuous Continuous   Liters -- 0-1L  As needed -- 2L 2L     Treadmill   MPH -- -- 2 1.5 1.5   Grade -- -- 0 0 0   Minutes -- -- 15 15 15    METs -- -- 2.53 2.15 2.15     Recumbant Bike   Level -- -- 2 4 4    RPM -- -- 50 -- --   Watts -- -- 25 26 26    Minutes -- -- 15 15 15    METs -- -- 2.7 3.12 3.12     NuStep   Level -- 2 5 4 4    Minutes -- 15 15 15 15    METs -- 2.4 2.5 2.2 2.2     Arm Ergometer   Level -- 1 1 1 1    Watts -- 15 15 -- --   Minutes -- 15 15 15 15    METs -- 2.09 2.2 2.09 2.09     REL-XR   Level -- -- 2 3 3    Minutes -- -- 15 15 15      T5 Nustep  Level -- -- 3 -- --   Minutes -- -- 15 -- --   METs -- -- 2.2 -- --     Home Exercise Plan   Plans to continue exercise at -- -- -- -- Home (comment)  walk   Frequency -- -- -- -- Add 2 additional days to program exercise sessions.   Initial Home Exercises Provided -- -- -- -- 02/09/24     Oxygen   Maintain Oxygen Saturation -- 88% or higher 88% or higher 88% or higher 88% or higher    Row Name 02/10/24 1600 02/24/24 0800           Response to Exercise   Blood Pressure (Admit) 108/58 118/54      Blood Pressure (Exit) 112/60 108/58      Heart Rate (Admit) 90 bpm 80 bpm      Heart Rate (Exercise) 106 bpm 107 bpm      Heart Rate (Exit) 97 bpm 70 bpm      Oxygen Saturation (Admit) 91 % 91 %      Oxygen Saturation (Exercise) 87 % 87 %  came up to 88      Oxygen Saturation (Exit) 93 % 92 %      Rating of Perceived Exertion (Exercise) 13 14      Perceived Dyspnea (Exercise) 3 2      Symptoms none none      Duration Progress to 30 minutes of  aerobic without signs/symptoms of physical distress Progress to 30 minutes of  aerobic without signs/symptoms of physical distress      Intensity THRR unchanged THRR unchanged        Progression    Progression Continue to progress workloads to maintain intensity without signs/symptoms of physical distress. Continue to progress workloads to maintain intensity without signs/symptoms of physical distress.      Average METs 2.4 2.41        Resistance Training   Training Prescription Yes Yes      Weight 5lb 5lb      Reps 10-15 10-15        Interval Training   Interval Training No No        Oxygen   Oxygen Continuous Continuous      Liters 2L 2L        Treadmill   MPH 1.6 --      Grade 0 --      Minutes 15 --      METs 2.23 --        Recumbant Bike   Level 4 5      Watts 23 26      Minutes 15 15      METs 3.13 3.13        NuStep   Level 1 4      Minutes 15 15      METs 1.8 2.4        Arm Ergometer   Level -- 1      Watts -- 15      Minutes -- 15      METs -- 2.09        T5 Nustep   Level 4 4      Minutes 15 15      METs 2 1.9        Home Exercise Plan   Plans to continue exercise at Home (comment)  walk Home (comment)  walk      Frequency Add 2 additional days to program  exercise sessions. Add 2 additional days to program exercise sessions.      Initial Home Exercises Provided 02/09/24 02/09/24        Oxygen   Maintain Oxygen Saturation 88% or higher 88% or higher         Exercise Comments:   Exercise Comments     Row Name 12/24/23 1054           Exercise Comments First full day of exercise!  Patient was oriented to gym and equipment including functions, settings, policies, and procedures.  Patient's individual exercise prescription and treatment plan were reviewed.  All starting workloads were established based on the results of the 6 minute walk test done at initial orientation visit.  The plan for exercise progression was also introduced and progression will be customized based on patient's performance and goals.          Exercise Goals and Review:   Exercise Goals     Row Name 12/20/23 1111             Exercise Goals   Increase Physical  Activity Yes       Intervention Provide advice, education, support and counseling about physical activity/exercise needs.;Develop an individualized exercise prescription for aerobic and resistive training based on initial evaluation findings, risk stratification, comorbidities and participant's personal goals.       Expected Outcomes Short Term: Attend rehab on a regular basis to increase amount of physical activity.;Long Term: Add in home exercise to make exercise part of routine and to increase amount of physical activity.;Long Term: Exercising regularly at least 3-5 days a week.       Increase Strength and Stamina Yes       Intervention Provide advice, education, support and counseling about physical activity/exercise needs.;Develop an individualized exercise prescription for aerobic and resistive training based on initial evaluation findings, risk stratification, comorbidities and participant's personal goals.       Expected Outcomes Short Term: Increase workloads from initial exercise prescription for resistance, speed, and METs.;Long Term: Improve cardiorespiratory fitness, muscular endurance and strength as measured by increased METs and functional capacity ( );Short Term: Perform resistance training exercises routinely during rehab and add in resistance training at home       Able to understand and use rate of perceived exertion (RPE) scale Yes       Intervention Provide education and explanation on how to use RPE scale       Expected Outcomes Short Term: Able to use RPE daily in rehab to express subjective intensity level;Long Term:  Able to use RPE to guide intensity level when exercising independently       Able to understand and use Dyspnea scale Yes       Intervention Provide education and explanation on how to use Dyspnea scale       Expected Outcomes Short Term: Able to use Dyspnea scale daily in rehab to express subjective sense of shortness of breath during exertion;Long Term: Able to  use Dyspnea scale to guide intensity level when exercising independently       Knowledge and understanding of Target Heart Rate Range (THRR) Yes       Intervention Provide education and explanation of THRR including how the numbers were predicted and where they are located for reference       Expected Outcomes Short Term: Able to state/look up THRR;Short Term: Able to use daily as guideline for intensity in rehab;Long Term: Able to use THRR to govern intensity when exercising  independently       Able to check pulse independently Yes       Intervention Provide education and demonstration on how to check pulse in carotid and radial arteries.;Review the importance of being able to check your own pulse for safety during independent exercise       Expected Outcomes Short Term: Able to explain why pulse checking is important during independent exercise;Long Term: Able to check pulse independently and accurately       Understanding of Exercise Prescription Yes       Intervention Provide education, explanation, and written materials on patient's individual exercise prescription       Expected Outcomes Short Term: Able to explain program exercise prescription;Long Term: Able to explain home exercise prescription to exercise independently          Exercise Goals Re-Evaluation :  Exercise Goals Re-Evaluation     Row Name 12/24/23 1055 12/29/23 0747 01/13/24 1809 01/27/24 0929 02/09/24 1204     Exercise Goal Re-Evaluation   Exercise Goals Review Increase Physical Activity;Able to understand and use rate of perceived exertion (RPE) scale;Knowledge and understanding of Target Heart Rate Range (THRR);Understanding of Exercise Prescription;Increase Strength and Stamina;Able to understand and use Dyspnea scale;Able to check pulse independently Increase Physical Activity;Increase Strength and Stamina;Understanding of Exercise Prescription Increase Physical Activity;Increase Strength and Stamina;Understanding of  Exercise Prescription Increase Physical Activity;Increase Strength and Stamina;Understanding of Exercise Prescription Increase Physical Activity;Able to understand and use rate of perceived exertion (RPE) scale;Knowledge and understanding of Target Heart Rate Range (THRR);Understanding of Exercise Prescription;Increase Strength and Stamina;Able to understand and use Dyspnea scale;Able to check pulse independently   Comments Reviewed RPE and dyspnea scale, THR and program prescription with pt today.  Pt voiced understanding and was given a copy of goals to take home. Edna is doing well in rehab. He was able to attend his first and only session during this review period. During his one session he was able to use the T4 nustep at level 2, and the arm ergometer at level 1. We will continue to monitor his progress in the program. Reynold is doing well in rehab. He increased his level on the T4 nustep to 5 and increased to level 3 on the T5 nustep. He added the recumbent bike at level 2. He maintained level 2 on the XR and maintained a speed of 2 mph with no incline on the treadmill. We will continue to monitor his progress in the program. Garrison continues to do well in rehab. He was recently able to increase from level 2 to 4 on the recumbent bike. He was also able to increase from level 2 to 3 on the XR. We will continue to monitor his progress in the program. Reviewed home exercise with pt today from 11:30am to 11:40am.  Pt plans to walk for exercise.  Reviewed THR, pulse, RPE, sign and symptoms, pulse oximetery and when to call 911 or MD.  Also discussed weather considerations and indoor options.  Pt voiced understanding.   Expected Outcomes Short: Use RPE daily to regulate intensity. Long: Follow program prescription in THR. Short: Continue to follow exercise prescription. Long: Continue exercise to improve strength and stamina. Short: Continue to progressively increase treadmill workload. Long: Continue exercise to  improve strength and stamina. Short: Continue to follow exercise prescription. Long: Continue exercise to improve strength and stamina. Short: add 1-2 days a week of exercise at home on off days of rehab. Long: maintain independet exercise routine upon graduation from  pumonary rehab.    Row Name 02/10/24 1621 02/24/24 0805           Exercise Goal Re-Evaluation   Exercise Goals Review Increase Physical Activity;Increase Strength and Stamina;Understanding of Exercise Prescription Increase Physical Activity;Increase Strength and Stamina;Understanding of Exercise Prescription      Comments Carsyn continues to do well in rehab. He was able to increase from level 4 to 5 on the T5 nustep, and increase his speed on the treadmill from 1.5 to 1.50mph. We will continue to monitor and encourage his progress in the program. Alcides continues to do well in rehab. He increased to level 5 on the recumbent bike and level 4 on the T4 nustep. He maintained level 4 on the T5 nustep and level 1 on the arm ergometer. We will continue to monitor and encourage his progress in the program.      Expected Outcomes Short: Continue to increase treadmill workload. Long: Continue exercise to improve strength and stamina. Short: Continue to progressively increase nustep and recumbent bike workloads. Long: Continue exercise to improve strength and stamina.         Discharge Exercise Prescription (Final Exercise Prescription Changes):  Exercise Prescription Changes - 02/24/24 0800       Response to Exercise   Blood Pressure (Admit) 118/54    Blood Pressure (Exit) 108/58    Heart Rate (Admit) 80 bpm    Heart Rate (Exercise) 107 bpm    Heart Rate (Exit) 70 bpm    Oxygen Saturation (Admit) 91 %    Oxygen Saturation (Exercise) 87 %   came up to 88   Oxygen Saturation (Exit) 92 %    Rating of Perceived Exertion (Exercise) 14    Perceived Dyspnea (Exercise) 2    Symptoms none    Duration Progress to 30 minutes of  aerobic without  signs/symptoms of physical distress    Intensity THRR unchanged      Progression   Progression Continue to progress workloads to maintain intensity without signs/symptoms of physical distress.    Average METs 2.41      Resistance Training   Training Prescription Yes    Weight 5lb    Reps 10-15      Interval Training   Interval Training No      Oxygen   Oxygen Continuous    Liters 2L      Recumbant Bike   Level 5    Watts 26    Minutes 15    METs 3.13      NuStep   Level 4    Minutes 15    METs 2.4      Arm Ergometer   Level 1    Watts 15    Minutes 15    METs 2.09      T5 Nustep   Level 4    Minutes 15    METs 1.9      Home Exercise Plan   Plans to continue exercise at Home (comment)   walk   Frequency Add 2 additional days to program exercise sessions.    Initial Home Exercises Provided 02/09/24      Oxygen   Maintain Oxygen Saturation 88% or higher          Nutrition:  Target Goals: Understanding of nutrition guidelines, daily intake of sodium 1500mg , cholesterol 200mg , calories 30% from fat and 7% or less from saturated fats, daily to have 5 or more servings of fruits and vegetables.  Education: Nutrition 1 -Group  instruction provided by verbal, written material, interactive activities, discussions, models, and posters to present general guidelines for heart healthy nutrition including macronutrients, label reading, and promoting whole foods over processed counterparts. Education serves as Pensions consultant of discussion of heart healthy eating for all. Written material provided at class time.     Education: Nutrition 2 -Group instruction provided by verbal, written material, interactive activities, discussions, models, and posters to present general guidelines for heart healthy nutrition including sodium, cholesterol, and saturated fat. Providing guidance of habit forming to improve blood pressure, cholesterol, and body weight. Written material provided at  class time. Flowsheet Row Pulmonary Rehab from 03/01/2024 in [facility] Cardiac and Pulmonary Rehab  Date 01/26/24  Educator jg  Instruction Review Code 1- Verbalizes Understanding      Biometrics:  Pre Biometrics - 12/20/23 1112       Pre Biometrics   Height 5' 8.5 (1.74 m)    Weight 159 lb 6.4 oz (72.3 kg)    Waist Circumference 35.5 inches    Hip Circumference 39 inches    Waist to Hip Ratio 0.91 %    BMI (Calculated) 23.88    Single Leg Stand 3 seconds           Nutrition Therapy Plan and Nutrition Goals:  Nutrition Therapy & Goals - 01/03/24 1038       Nutrition Therapy   Diet Cardiac, Low Na    Protein (specify units) 70-90    Fiber 30 grams    Whole Grain Foods 3 servings    Saturated Fats 15 max. grams    Fruits and Vegetables 5 servings/day    Sodium 2 grams      Personal Nutrition Goals   Nutrition Goal Read labels and reduce sodium intake to below 2300mg . Ideally 1500mg  per day.    Personal Goal #2 Eat 15-30gProtein and 30-60gCarbs at each meal.    Personal Goal #3 Reduce saturated fat, less than 12g per day. Replace bad fats for more heart healthy fats.    Comments Patient eats 2 meals per day mostly. He likes veggies and tries to include them at most of his meals. He uses salt at some of his meals. Spoke with him about cutting back on sodium, with goal of less than 1500mg  per day, or a less strict goal of less than 2300mg  per day. Reviewed Mediterranean diet handout. Educated on types of fats, sources, and how to read facts labels. Provided goal of less than 12g of saturated fat per day. Brainstormed a few meals and snacks with foods he likes and will eat.      Intervention Plan   Intervention Prescribe, educate and counsel regarding individualized specific dietary modifications aiming towards targeted core components such as weight, hypertension, lipid management, diabetes, heart failure and other comorbidities.;Nutrition handout(s) given to patient.     Expected Outcomes Short Term Goal: Understand basic principles of dietary content, such as calories, fat, sodium, cholesterol and nutrients.;Short Term Goal: A plan has been developed with personal nutrition goals set during dietitian appointment.;Long Term Goal: Adherence to prescribed nutrition plan.          Nutrition Assessments:  MEDIFICTS Score Key: >=70 Need to make dietary changes  40-70 Heart Healthy Diet <= 40 Therapeutic Level Cholesterol Diet  Flowsheet Row Pulmonary Rehab from 12/20/2023 in [facility] Cardiac and Pulmonary Rehab  Picture Your Plate Total Score on Admission 61   Picture Your Plate Scores: <59 Unhealthy dietary pattern with much room for improvement. 41-50 Dietary pattern unlikely  to meet recommendations for good health and room for improvement. 51-60 More healthful dietary pattern, with some room for improvement.  >60 Healthy dietary pattern, although there may be some specific behaviors that could be improved.   Nutrition Goals Re-Evaluation:  Nutrition Goals Re-Evaluation     Row Name 01/10/24 1126             Goals   Comment Patient was informed on why it is important to maintain a balanced diet when dealing with Respiratory issues. Explained that it takes a lot of energy to breath and when they are short of breath often they will need to have a good diet to help keep up with the calories they are expending for breathing.       Expected Outcome Short: Choose and plan snacks accordingly to patients caloric intake to improve breathing. Long: Maintain a diet independently that meets their caloric intake to aid in daily shortness of breath.          Nutrition Goals Discharge (Final Nutrition Goals Re-Evaluation):  Nutrition Goals Re-Evaluation - 01/10/24 1126       Goals   Comment Patient was informed on why it is important to maintain a balanced diet when dealing with Respiratory issues. Explained that it takes a lot of energy to breath and when they  are short of breath often they will need to have a good diet to help keep up with the calories they are expending for breathing.    Expected Outcome Short: Choose and plan snacks accordingly to patients caloric intake to improve breathing. Long: Maintain a diet independently that meets their caloric intake to aid in daily shortness of breath.          Psychosocial: Target Goals: Acknowledge presence or absence of significant depression and/or stress, maximize coping skills, provide positive support system. Participant is able to verbalize types and ability to use techniques and skills needed for reducing stress and depression.   Education: Stress, Anxiety, and Depression - Group verbal and visual presentation to define topics covered.  Reviews how body is impacted by stress, anxiety, and depression.  Also discusses healthy ways to reduce stress and to treat/manage anxiety and depression.  Written material provided at class time. Flowsheet Row Pulmonary Rehab from 03/01/2024 in [facility] Cardiac and Pulmonary Rehab  Date 03/01/24  Educator kb  Instruction Review Code 1- [employer] Understanding    Education: Sleep Hygiene -Provides group verbal and written instruction about how sleep can affect your health.  Define sleep hygiene, discuss sleep cycles and impact of sleep habits. Review good sleep hygiene tips.    Initial Review & Psychosocial Screening:  Initial Psych Review & Screening - 12/17/23 1427       Initial Review   Current issues with Current Stress Concerns;History of Depression    Source of Stress Concerns Family      Family Dynamics   Good Support System? Yes      Barriers   Psychosocial barriers to participate in program There are no identifiable barriers or psychosocial needs.;The patient should benefit from training in stress management and relaxation.      Screening Interventions   Interventions Encouraged to exercise;To provide support and resources with identified  psychosocial needs;Provide feedback about the scores to participant    Expected Outcomes Short Term goal: Utilizing psychosocial counselor, staff and physician to assist with identification of specific Stressors or current issues interfering with healing process. Setting desired goal for each stressor or current issue identified.;Long Term  Goal: Stressors or current issues are controlled or eliminated.;Short Term goal: Identification and review with participant of any Quality of Life or Depression concerns found by scoring the questionnaire.;Long Term goal: The participant improves quality of Life and PHQ9 Scores as seen by post scores and/or verbalization of changes          Quality of Life Scores:  Scores of 19 and below usually indicate a poorer quality of life in these areas.  A difference of  2-3 points is a clinically meaningful difference.  A difference of 2-3 points in the total score of the Quality of Life Index has been associated with significant improvement in overall quality of life, self-image, physical symptoms, and general health in studies assessing change in quality of life.  PHQ-9: Review Flowsheet       12/20/2023  Depression screen PHQ 2/9  Decreased Interest 0  Down, Depressed, Hopeless 0  PHQ - 2 Score 0  Altered sleeping 0  Tired, decreased energy 1  Change in appetite 0  Feeling bad or failure about yourself  0  Trouble concentrating 0  Moving slowly or fidgety/restless 0  Suicidal thoughts 1  PHQ-9 Score 2  Difficult doing work/chores Not difficult at all   Interpretation of Total Score  Total Score Depression Severity:  1-4 = Minimal depression, 5-9 = Mild depression, 10-14 = Moderate depression, 15-19 = Moderately severe depression, 20-27 = Severe depression   Psychosocial Evaluation and Intervention:  Psychosocial Evaluation - 12/17/23 1427       Psychosocial Evaluation & Interventions   Interventions Encouraged to exercise with the program and  follow exercise prescription;Relaxation education;Stress management education    Comments Mr. Quebedeaux is coming to pulmonary rehab with COPD. He notes when he gets very short of breath and/or anxious he loses control of his bladder, so he tries to schedule his activities to prevent that form happening if possible. He is following with a urologist for this issue. When asked about stress, he mentions that his wife has been in the memory care unit for 3 years and he visits almost daily. He also states that he has never been the same mentally since his son and daughter-in-law were murdered during the night  while Mr. Bossard and his wife were staying at their house. His wife and he followed with a therapist for years and he mentions he knows life will never be the same. He wants to keep up his independence and work on his stamina while in the program.    Expected Outcomes Short: attend cardiac rehab for education and exercise Long: develop and maintain positive self care habits.    Continue Psychosocial Services  Follow up required by staff          Psychosocial Re-Evaluation:  Psychosocial Re-Evaluation     Row Name 01/03/24 1045 01/10/24 1127 02/28/24 1123 02/28/24 1132       Psychosocial Re-Evaluation   Current issues with None Identified None Identified None Identified None Identified    Comments Patient reports no issues with their current mental states, sleep, stress, depression or anxiety. Will follow up with patient in a few weeks for any changes. Patient reports no issues with their current mental states, sleep, stress, depression or anxiety. Will follow up with patient in a few weeks for any changes. Patient reports no issues with their current mental states, sleep, stress, depression or anxiety. Patient is graduating soon and wants to continue working out post [facility]. Raequan reports no issues with his  diet at this time.    Expected Outcomes Short: Continue to exercise regularly to support  mental health and notify staff of any changes. Long: maintain mental health and well being through teaching of rehab or prescribed medications independently. Short: Continue to exercise regularly to support mental health and notify staff of any changes. Long: maintain mental health and well being through teaching of rehab or prescribed medications independently. Short: Continue to exercise regularly to support mental health and notify staff of any changes. Long: maintain mental health and well being through teaching of rehab or prescribed medications independently. Short: Continue to exercise regularly to support mental health and notify staff of any changes. Long: maintain mental health and well being through teaching of rehab or prescribed medications independently.    Interventions Encouraged to attend Pulmonary Rehabilitation for the exercise Encouraged to attend Pulmonary Rehabilitation for the exercise Encouraged to attend Pulmonary Rehabilitation for the exercise Encouraged to attend Pulmonary Rehabilitation for the exercise    Continue Psychosocial Services  Follow up required by staff Follow up required by staff Follow up required by staff Follow up required by staff       Psychosocial Discharge (Final Psychosocial Re-Evaluation):  Psychosocial Re-Evaluation - 02/28/24 1132       Psychosocial Re-Evaluation   Current issues with None Identified    Comments Boruch reports no issues with his diet at this time.    Expected Outcomes Short: Continue to exercise regularly to support mental health and notify staff of any changes. Long: maintain mental health and well being through teaching of rehab or prescribed medications independently.    Interventions Encouraged to attend Pulmonary Rehabilitation for the exercise    Continue Psychosocial Services  Follow up required by staff          Education: Education Goals: Education classes will be provided on a weekly basis, covering required topics.  Participant will state understanding/return demonstration of topics presented.  Learning Barriers/Preferences:  Learning Barriers/Preferences - 12/17/23 1414       Learning Barriers/Preferences   Learning Barriers None    Learning Preferences None          General Pulmonary Education Topics:  Infection Prevention: - Provides verbal and written material to individual with discussion of infection control including proper hand washing and proper equipment cleaning during exercise session. Flowsheet Row Pulmonary Rehab from 03/01/2024 in [facility] Cardiac and Pulmonary Rehab  Date 12/20/23  Educator [facility]  Instruction Review Code 1- Verbalizes Understanding    Falls Prevention: - Provides verbal and written material to individual with discussion of falls prevention and safety. Flowsheet Row Pulmonary Rehab from 03/01/2024 in [facility] Cardiac and Pulmonary Rehab  Date 12/20/23  Educator [facility]  Instruction Review Code 1- Verbalizes Understanding    Chronic Lung Disease Review: - Group verbal instruction with posters, models, PowerPoint presentations and videos,  to review new updates, new respiratory medications, new advancements in procedures and treatments. Providing information on websites and 800 numbers for continued self-education. Includes information about supplement oxygen, available portable oxygen systems, continuous and intermittent flow rates, oxygen safety, concentrators, and Medicare reimbursement for oxygen. Explanation of Pulmonary Drugs, including class, frequency, complications, importance of spacers, rinsing mouth after steroid MDI's, and proper cleaning methods for nebulizers. Review of basic lung anatomy and physiology related to function, structure, and complications of lung disease. Review of risk factors. Discussion about methods for diagnosing sleep apnea and types of masks and machines for OSA. Includes a review of the use of types of environmental  controls: home humidity,  furnaces, filters, dust mite/pet prevention, HEPA vacuums. Discussion about weather changes, air quality and the benefits of nasal washing. Instruction on Warning signs, infection symptoms, calling MD promptly, preventive modes, and value of vaccinations. Review of effective airway clearance, coughing and/or vibration techniques. Emphasizing that all should Create an Action Plan. Written material provided at class time. Flowsheet Row Pulmonary Rehab from 03/01/2024 in [facility] Cardiac and Pulmonary Rehab  Education need identified 12/20/23  Date 02/23/24  Educator jh  Instruction Review Code 1- Verbalizes Understanding    AED/CPR: - Group verbal and written instruction with the use of models to demonstrate the basic use of the AED with the basic ABC's of resuscitation.    Tests and Procedures:  - Group verbal and visual presentation and models provide information about basic cardiac anatomy and function. Reviews the testing methods done to diagnose heart disease and the outcomes of the test results. Describes the treatment choices: Medical Management, Angioplasty, or Coronary Bypass Surgery for treating various heart conditions including Myocardial Infarction, Angina, Valve Disease, and Cardiac Arrhythmias.  Written material provided at class time.   Medication Safety: - Group verbal and visual instruction to review commonly prescribed medications for heart and lung disease. Reviews the medication, class of the drug, and side effects. Includes the steps to properly store meds and maintain the prescription regimen.  Written material given at graduation. Flowsheet Row Pulmonary Rehab from 03/01/2024 in [facility] Cardiac and Pulmonary Rehab  Date 02/09/24  Educator KB  Instruction Review Code 1- Verbalizes Understanding    Other: -Provides group and verbal instruction on various topics (see comments)   Knowledge Questionnaire Score:  Knowledge Questionnaire Score - 12/20/23 1118       Knowledge  Questionnaire Score   Pre Score 14/18           Core Components/Risk Factors/Patient Goals at Admission:  Personal Goals and Risk Factors at Admission - 12/20/23 1118       Core Components/Risk Factors/Patient Goals on Admission   Improve shortness of breath with ADL's Yes    Intervention Provide education, individualized exercise plan and daily activity instruction to help decrease symptoms of SOB with activities of daily living.    Expected Outcomes Short Term: Improve cardiorespiratory fitness to achieve a reduction of symptoms when performing ADLs;Long Term: Be able to perform more ADLs without symptoms or delay the onset of symptoms    Hypertension Yes    Intervention Provide education on lifestyle modifcations including regular physical activity/exercise, weight management, moderate sodium restriction and increased consumption of fresh fruit, vegetables, and low fat dairy, alcohol moderation, and smoking cessation.;Monitor prescription use compliance.    Expected Outcomes Short Term: Continued assessment and intervention until BP is < 140/46mm HG in hypertensive participants. < 130/17mm HG in hypertensive participants with diabetes, heart failure or chronic kidney disease.;Long Term: Maintenance of blood pressure at goal levels.          Education:Diabetes - Individual verbal and written instruction to review signs/symptoms of diabetes, desired ranges of glucose level fasting, after meals and with exercise. Acknowledge that pre and post exercise glucose checks will be done for 3 sessions at entry of program.   Know Your Numbers and Heart Failure: - Group verbal and visual instruction to discuss disease risk factors for cardiac and pulmonary disease and treatment options.  Reviews associated critical values for Overweight/Obesity, Hypertension, Cholesterol, and Diabetes.  Discusses basics of heart failure: signs/symptoms and treatments.  Introduces Heart Failure Zone chart for action  plan for heart failure. Written material provided at class time. Flowsheet Row Pulmonary Rehab from 03/01/2024 in [facility] Cardiac and Pulmonary Rehab  Date 02/16/24  Educator kb  Instruction Review Code 1- Verbalizes Understanding    Core Components/Risk Factors/Patient Goals Review:   Goals and Risk Factor Review     Row Name 01/03/24 1044 01/10/24 1134 02/28/24 1136         Core Components/Risk Factors/Patient Goals Review   Personal Goals Review Improve shortness of breath with ADL's Other Other;Improve shortness of breath with ADL's     Review Spoke to patient about their shortness of breath and what they can do to improve. Patient has been informed of breathing techniques when starting the program. Patient is informed to tell staff if they have had any med changes and that certain meds they are taking or not taking can be causing shortness of breath. Diaphragmatic and PLB breathing explained and performed with patient. Patient has a better understanding of how to do these exercises to help with breathing performance and relaxation. Patient performed breathing techniques adequately and to practice further at home. Bradin wants to walk a little more to see if he can walk at home. Informed him to check his oxygen and use portable oxygen when walking. He is looking into buying a portable concentrator.     Expected Outcomes Short: Attend [facility] regularly to improve shortness of breath with ADL's. Long: maintain independence with ADL's Diaphragmatic and PLB breathing explained and performed with patient. Patient has a better understanding of how to do these exercises to help with breathing performance and relaxation. Patient performed breathing techniques adequately and to practice further at home. Short: walk at home. Long: maintain waling and oxygen saturation independently.        Core Components/Risk Factors/Patient Goals at Discharge (Final Review):   Goals and Risk Factor Review - 02/28/24  1136       Core Components/Risk Factors/Patient Goals Review   Personal Goals Review Other;Improve shortness of breath with ADL's    Review Kalen wants to walk a little more to see if he can walk at home. Informed him to check his oxygen and use portable oxygen when walking. He is looking into buying a portable concentrator.    Expected Outcomes Short: walk at home. Long: maintain waling and oxygen saturation independently.          ITP Comments:  ITP Comments     Row Name 12/17/23 1434 12/20/23 1104 12/24/23 1054 01/05/24 0816 01/17/24 1109   ITP Comments Initial phone call completed. Diagnosis can be found in [facility] 5/6. EP Orientation scheduled for Monday 7/14 at 9am. Completed and gym orientation for pulmonary rehab. Initial ITP created and sent for review to Dr. Faud Aleskerov, Medical Director. First full day of exercise!  Patient was oriented to gym and equipment including functions, settings, policies, and procedures.  Patient's individual exercise prescription and treatment plan were reviewed.  All starting workloads were established based on the results of the 6 minute walk test done at initial orientation visit.  The plan for exercise progression was also introduced and progression will be customized based on patient's performance and goals. 30 Day review completed. Medical Director ITP review done, changes made as directed, and signed approval by Medical Director. New to program. Placed Paola on oxygen today since he was not feeling well and his oxygen has been dipping into the mid 80s the last few sessions.    Row Name 02/02/24 [phone] 03/01/24 1101  ITP Comments 30 Day review completed. Medical Director ITP review done; changes made as directed and signed approval by Medical Director. 30 Day review completed. Medical Director ITP review done, changes made as directed, and signed approval by Medical Director.         Comments: 30 day review

## 2024-03-01 NOTE — Progress Notes (Signed)
 Daily Session Note  Patient Details  Name: Logan Morgan MRN: [id number] Date of Birth: 1940/10/20 Referring Provider:   Flowsheet Row Pulmonary Rehab from 12/20/2023 in [facility] Cardiac and Pulmonary Rehab  Referring Provider Dr. Halina Picking    Encounter Date: 03/01/2024  Check In:  Session Check In - 03/01/24 1017       Check-In   Supervising physician immediately available to respond to emergencies See telemetry face sheet for immediately available ER MD    Location [facility]-Cardiac & Pulmonary Rehab    Staff Present Burnard Davenport [facility] Peggi, RN, DNP, NE-BC;Maxon Conetta BS, Exercise Physiologist;Joseph [employer], MS, Exercise Physiologist    Virtual Visit No    Medication changes reported     No    Fall or balance concerns reported    No    Tobacco Cessation No Change    Warm-up and Cool-down Performed on first and last piece of equipment    Resistance Training Performed Yes    VAD Patient? No    PAD/SET Patient? No      Pain Assessment   Currently in Pain? No/denies             Social History   Tobacco Use  Smoking Status Former   Current packs/day: 0.00   Types: Cigarettes   Start date: 67   Quit date: 1998   Years since quitting: 27.7  Smokeless Tobacco Never    Goals Met:  Proper associated with RPD/PD & O2 Sat Independence with exercise equipment Using PLB without cueing & demonstrates good technique Exercise tolerated well No report of concerns or symptoms today Strength training completed today  Goals Unmet:  Not Applicable  Comments: Pt able to follow exercise prescription today without complaint.  Will continue to monitor for progression.    Dr. Oneil Pinal is Medical Director for  [facility] Cardiac Rehabilitation.  Dr. Fuad Aleskerov is Medical Director for [facility] Pulmonary Rehabilitation.

## 2024-03-03 ENCOUNTER — Encounter

## 2024-03-03 VITALS — Ht 68.5 in | Wt 155.0 lb

## 2024-03-03 DIAGNOSIS — J449 Chronic obstructive pulmonary disease, unspecified: Secondary | ICD-10-CM

## 2024-03-03 NOTE — Patient Instructions (Signed)
 Discharge Patient Instructions  Patient Details  Name: Logan Morgan MRN: [id number] Date of Birth: January 05, 1941 Referring Provider:  Sadie Manna, MD  Number of Visits: 36/36  Reason for Discharge:  Patient reached a stable level of exercise. Patient independent in their exercise. Patient has met program and personal goals.  Diagnosis:  Chronic obstructive pulmonary disease, unspecified COPD type (HCC)  Initial Exercise Prescription:  Initial Exercise Prescription - 12/20/23 1100       Date of Initial Exercise RX and Referring Provider   Date 12/20/23    Referring Provider Dr. Halina Picking      Oxygen   Oxygen Continuous    Liters 0-1L   As needed   Maintain Oxygen Saturation 88% or higher      Treadmill   MPH 2    Grade 0    Minutes 15    METs 2.53      NuStep   Level 2    SPM 80    Minutes 15    METs 2      Arm Ergometer   Level 1    Watts 25    RPM 25    Minutes 15    METs 2      REL-XR   Level 2    Watts 25    Speed 50    Minutes 15    METs 2      T5 Nustep   Level 2    SPM 80    Minutes 15    METs 2      Track   Laps 27    Minutes 15    METs 2      Prescription Details   Duration Progress to 30 minutes of continuous aerobic without signs/symptoms of physical distress      Intensity   THRR 40-80% of Max Heartrate 101-125    Ratings of Perceived Exertion 11-13    Perceived Dyspnea 0-4      Progression   Progression Continue to progress workloads to maintain intensity without signs/symptoms of physical distress.      Resistance Training   Training Prescription Yes    Weight 5lb    Reps 10-15         Discharge Exercise Prescription (Final Exercise Prescription Changes):  Exercise Prescription Changes - 02/24/24 0800       Response to Exercise   Blood Pressure (Admit) 118/54    Blood Pressure (Exit) 108/58    Heart Rate (Admit) 80 bpm    Heart Rate (Exercise) 107 bpm    Heart Rate (Exit) 70 bpm    Oxygen Saturation  (Admit) 91 %    Oxygen Saturation (Exercise) 87 %   came up to 88   Oxygen Saturation (Exit) 92 %    Rating of Perceived Exertion (Exercise) 14    Perceived Dyspnea (Exercise) 2    Symptoms none    Duration Progress to 30 minutes of  aerobic without signs/symptoms of physical distress    Intensity THRR unchanged      Progression   Progression Continue to progress workloads to maintain intensity without signs/symptoms of physical distress.    Average METs 2.41      Resistance Training   Training Prescription Yes    Weight 5lb    Reps 10-15      Interval Training   Interval Training No      Oxygen   Oxygen Continuous    Liters 2L      Recumbant  Bike   Level 5    Watts 26    Minutes 15    METs 3.13      NuStep   Level 4    Minutes 15    METs 2.4      Arm Ergometer   Level 1    Watts 15    Minutes 15    METs 2.09      T5 Nustep   Level 4    Minutes 15    METs 1.9      Home Exercise Plan   Plans to continue exercise at Home (comment)   walk   Frequency Add 2 additional days to program exercise sessions.    Initial Home Exercises Provided 02/09/24      Oxygen   Maintain Oxygen Saturation 88% or higher         Functional Capacity:  6 Minute Walk     Row Name 12/20/23 1105 03/03/24 1118       6 Minute Walk   Phase Initial Discharge    Distance 1020 feet 900 feet    Distance % Change -- -11 %    Distance Feet Change -- -120 ft    Walk Time 6 minutes --    # of Rest Breaks 0 1    MPH 1.93 1.8    METS 2.07 2.2    RPE 13 15    Perceived Dyspnea  2 3    VO2 Peak 7.24 7.69    Symptoms No No    Resting HR 77 bpm 88 bpm    Resting BP 94/52 110/60    Resting Oxygen Saturation  90 % 90 %    Exercise Oxygen Saturation  during 6 min walk 86 % 84 %    Max Ex. HR 102 bpm 117 bpm    Max Ex. BP 146/56 160/64    2 Minute Post BP 104/52 120/68      Interval HR   1 Minute HR 95 96    2 Minute HR 97 102    3 Minute HR 100 109    4 Minute HR 101 117    5  Minute HR 102 112    6 Minute HR 99 113    2 Minute Post HR 70 94    Interval Heart Rate? Yes Yes      Interval Oxygen   Interval Oxygen? Yes Yes    Baseline Oxygen Saturation % 90 % 90 %    1 Minute Oxygen Saturation % 92 % 87 %    1 Minute Liters of Oxygen 0 L 0 L    2 Minute Oxygen Saturation % 90 % 84 %    2 Minute Liters of Oxygen 0 L 0 L    3 Minute Oxygen Saturation % 88 % 84 %    3 Minute Liters of Oxygen 0 L 0 L    4 Minute Oxygen Saturation % 87 % 85 %    4 Minute Liters of Oxygen 0 L 0 L    5 Minute Oxygen Saturation % 87 % 84 %    5 Minute Liters of Oxygen 0 L 0 L    6 Minute Oxygen Saturation % 86 % 86 %    6 Minute Liters of Oxygen 0 L 0 L    2 Minute Post Oxygen Saturation % 97 % 93 %    2 Minute Post Liters of Oxygen 0 L 0 L  Nutrition & Weight - Outcomes:  Pre Biometrics - 12/20/23 1112       Pre Biometrics   Height 5' 8.5 (1.74 m)    Weight 159 lb 6.4 oz (72.3 kg)    Waist Circumference 35.5 inches    Hip Circumference 39 inches    Waist to Hip Ratio 0.91 %    BMI (Calculated) 23.88    Single Leg Stand 3 seconds          Post Biometrics - 03/03/24 1123        Post  Biometrics   Height 5' 8.5 (1.74 m)    Weight 155 lb (70.3 kg)    Waist Circumference 35 inches    Hip Circumference 39 inches    Waist to Hip Ratio 0.9 %    BMI (Calculated) 23.22    Single Leg Stand 3 seconds

## 2024-03-03 NOTE — Progress Notes (Signed)
 Daily Session Note  Patient Details  Name: Logan Morgan MRN: [id number] Date of Birth: 1940/07/29 Referring Provider:   Flowsheet Row Pulmonary Rehab from 12/20/2023 in [facility] Cardiac and Pulmonary Rehab  Referring Provider Dr. Halina Picking    Encounter Date: 03/03/2024  Check In:  Session Check In - 03/03/24 1107       Check-In   Supervising physician immediately available to respond to emergencies See telemetry face sheet for immediately available ER MD    Location [facility]-Cardiac & Pulmonary Rehab    Staff Present Burnard Davenport RN,BSN,MPA;Maxon Conetta BS, Exercise Physiologist;Joseph Hood RCP,RRT,BSRT;Noah Tickle, [place], Exercise Physiologist    Virtual Visit No    Medication changes reported     No    Fall or balance concerns reported    No    Tobacco Cessation No Change    Warm-up and Cool-down Performed on first and last piece of equipment    Resistance Training Performed Yes    VAD Patient? No    PAD/SET Patient? No      Pain Assessment   Currently in Pain? No/denies             Social History   Tobacco Use  Smoking Status Former   Current packs/day: 0.00   Types: Cigarettes   Start date: 69   Quit date: 1998   Years since quitting: 27.7  Smokeless Tobacco Never    Goals Met:  Proper associated with RPD/PD & O2 Sat Independence with exercise equipment Using PLB without cueing & demonstrates good technique Exercise tolerated well No report of concerns or symptoms today Strength training completed today  Goals Unmet:  Not Applicable  Comments:   6 Minute Walk     Row Name 12/20/23 1105 03/03/24 1118       6 Minute Walk   Phase Initial Discharge    Distance 1020 feet 900 feet    Distance % Change -- -11 %    Distance Feet Change -- -120 ft    Walk Time 6 minutes --    # of Rest Breaks 0 1    MPH 1.93 1.8    METS 2.07 2.2    RPE 13 15    Perceived Dyspnea  2 3    VO2 Peak 7.24 7.69    Symptoms No No    Resting HR 77 bpm 88 bpm     Resting BP 94/52 110/60    Resting Oxygen Saturation  90 % 90 %    Exercise Oxygen Saturation  during 6 min walk 86 % 84 %    Max Ex. HR 102 bpm 117 bpm    Max Ex. BP 146/56 160/64    2 Minute Post BP 104/52 120/68      Interval HR   1 Minute HR 95 96    2 Minute HR 97 102    3 Minute HR 100 109    4 Minute HR 101 117    5 Minute HR 102 112    6 Minute HR 99 113    2 Minute Post HR 70 94    Interval Heart Rate? Yes Yes      Interval Oxygen   Interval Oxygen? Yes Yes    Baseline Oxygen Saturation % 90 % 90 %    1 Minute Oxygen Saturation % 92 % 87 %    1 Minute Liters of Oxygen 0 L 0 L    2 Minute Oxygen Saturation % 90 % 84 %  2 Minute Liters of Oxygen 0 L 0 L    3 Minute Oxygen Saturation % 88 % 84 %    3 Minute Liters of Oxygen 0 L 0 L    4 Minute Oxygen Saturation % 87 % 85 %    4 Minute Liters of Oxygen 0 L 0 L    5 Minute Oxygen Saturation % 87 % 84 %    5 Minute Liters of Oxygen 0 L 0 L    6 Minute Oxygen Saturation % 86 % 86 %    6 Minute Liters of Oxygen 0 L 0 L    2 Minute Post Oxygen Saturation % 97 % 93 %    2 Minute Post Liters of Oxygen 0 L 0 L      Pt able to follow exercise prescription today without complaint.  Will continue to monitor for progression.    Dr. Oneil Pinal is Medical Director for [facility] Cardiac Rehabilitation.  Dr. Fuad Aleskerov is Medical Director for [facility] Pulmonary Rehabilitation.

## 2024-03-06 ENCOUNTER — Encounter

## 2024-03-06 DIAGNOSIS — J449 Chronic obstructive pulmonary disease, unspecified: Secondary | ICD-10-CM | POA: Diagnosis not present

## 2024-03-06 NOTE — Progress Notes (Signed)
 Daily Session Note  Patient Details  Name: Logan Morgan MRN: [id number] Date of Birth: 02-24-1941 Referring Provider:   Flowsheet Row Pulmonary Rehab from 12/20/2023 in [facility] Cardiac and Pulmonary Rehab  Referring Provider Dr. Halina Picking    Encounter Date: 03/06/2024  Check In:  Session Check In - 03/06/24 1057       Check-In   Supervising physician immediately available to respond to emergencies See telemetry face sheet for immediately available ER MD    Location [facility]-Cardiac & Pulmonary Rehab    Staff Present Burnard Davenport [facility] Peggi, RN, DNP, NE-BC;Joseph Hood RCP,RRT,BSRT;Laura Cates RN,BSN;Deseri Loss [place] BS, ACSM CEP, Exercise Physiologist    Virtual Visit No    Medication changes reported     No    Fall or balance concerns reported    No    Tobacco Cessation No Change    Warm-up and Cool-down Performed on first and last piece of equipment    Resistance Training Performed Yes    VAD Patient? No    PAD/SET Patient? No      Pain Assessment   Currently in Pain? No/denies             Social History   Tobacco Use  Smoking Status Former   Current packs/day: 0.00   Types: Cigarettes   Start date: 62   Quit date: 1998   Years since quitting: 27.7  Smokeless Tobacco Never    Goals Met:  Proper associated with RPD/PD & O2 Sat Independence with exercise equipment Using PLB without cueing & demonstrates good technique Exercise tolerated well No report of concerns or symptoms today Strength training completed today  Goals Unmet:  Not Applicable  Comments: Pt able to follow exercise prescription today without complaint.  Will continue to monitor for progression.    Dr. Oneil Pinal is Medical Director for [facility] Cardiac Rehabilitation.  Dr. Fuad Aleskerov is Medical Director for [facility] Pulmonary Rehabilitation.

## 2024-03-07 ENCOUNTER — Ambulatory Visit: Admitting: Physician Assistant

## 2024-03-07 VITALS — BP 109/64 | HR 77 | Ht 68.0 in | Wt 158.0 lb

## 2024-03-07 DIAGNOSIS — N3941 Urge incontinence: Secondary | ICD-10-CM | POA: Diagnosis not present

## 2024-03-07 LAB — BLADDER SCAN AMB NON-IMAGING

## 2024-03-07 MED ORDER — TROSPIUM CHLORIDE ER 60 MG PO CP24: 1.0000 | ORAL_CAPSULE | Freq: Every day | ORAL | 1 refills | Status: DC

## 2024-03-08 ENCOUNTER — Encounter: Attending: Pulmonary Disease

## 2024-03-08 DIAGNOSIS — J449 Chronic obstructive pulmonary disease, unspecified: Secondary | ICD-10-CM | POA: Insufficient documentation

## 2024-03-08 NOTE — Progress Notes (Signed)
 Daily Session Note  Patient Details  Name: CHRISTINE SCHIEFELBEIN MRN: [id number] Date of Birth: 07-15-1940 Referring Provider:   Flowsheet Row Pulmonary Rehab from 12/20/2023 in [facility] Cardiac and Pulmonary Rehab  Referring Provider Dr. Halina Picking    Encounter Date: 03/08/2024  Check In:  Session Check In - 03/08/24 1137       Check-In   Supervising physician immediately available to respond to emergencies See telemetry face sheet for immediately available ER MD    Location [facility]-Cardiac & Pulmonary Rehab    Staff Present Burnard Davenport [facility] Peggi, RN, DNP, NE-BC;Joseph [facility] RN,BSN;Margaret Best, MS, Exercise Physiologist    Virtual Visit No    Medication changes reported     No    Fall or balance concerns reported    No    Tobacco Cessation No Change    Warm-up and Cool-down Performed on first and last piece of equipment    Resistance Training Performed Yes    VAD Patient? No    PAD/SET Patient? No      Pain Assessment   Currently in Pain? No/denies             Social History   Tobacco Use  Smoking Status Former   Current packs/day: 0.00   Types: Cigarettes   Start date: 44   Quit date: 1998   Years since quitting: 27.7  Smokeless Tobacco Never    Goals Met:  Proper associated with RPD/PD & O2 Sat Independence with exercise equipment Using PLB without cueing & demonstrates good technique Exercise tolerated well No report of concerns or symptoms today Strength training completed today  Goals Unmet:  Not Applicable  Comments: Pt able to follow exercise prescription today without complaint.  Will continue to monitor for progression.    Dr. Oneil Pinal is Medical Director for [facility] Cardiac Rehabilitation.  Dr. Fuad Aleskerov is Medical Director for [facility] Pulmonary Rehabilitation.

## 2024-03-10 ENCOUNTER — Encounter

## 2024-03-10 DIAGNOSIS — J449 Chronic obstructive pulmonary disease, unspecified: Secondary | ICD-10-CM

## 2024-03-10 NOTE — Progress Notes (Signed)
 Daily Session Note  Patient Details  Name: Logan Morgan MRN: [id number] Date of Birth: July 13, 1940 Referring Provider:   Flowsheet Row Pulmonary Rehab from 12/20/2023 in [facility] Cardiac and Pulmonary Rehab  Referring Provider Dr. Halina Picking    Encounter Date: 03/10/2024  Check In:  Session Check In - 03/10/24 1110       Check-In   Supervising physician immediately available to respond to emergencies See telemetry face sheet for immediately available ER MD    Location [facility]-Cardiac & Pulmonary Rehab    Staff Present Burnard Davenport RN,BSN,MPA;Joseph Rolinda RCP,RRT,BSRT;Laura Cates RN,BSN;Maxon [place] BS, Exercise Physiologist    Virtual Visit No    Medication changes reported     No    Fall or balance concerns reported    No    Tobacco Cessation No Change    Warm-up and Cool-down Performed on first and last piece of equipment    Resistance Training Performed Yes    VAD Patient? No    PAD/SET Patient? No      Pain Assessment   Currently in Pain? No/denies             Social History   Tobacco Use  Smoking Status Former   Current packs/day: 0.00   Types: Cigarettes   Start date: 46   Quit date: 1998   Years since quitting: 27.7  Smokeless Tobacco Never    Goals Met:  Proper associated with RPD/PD & O2 Sat Independence with exercise equipment Using PLB without cueing & demonstrates good technique Exercise tolerated well No report of concerns or symptoms today Strength training completed today  Goals Unmet:  Not Applicable  Comments: Pt able to follow exercise prescription today without complaint.  Will continue to monitor for progression.    Dr. Oneil Pinal is Medical Director for [facility] Cardiac Rehabilitation.  Dr. Fuad Aleskerov is Medical Director for [facility] Pulmonary Rehabilitation.

## 2024-03-12 NOTE — Progress Notes (Signed)
 03/07/2024 2:06 PM   Logan Morgan Sep 18, 1940 [id number]  CC: Chief Complaint  Patient presents with   Follow-up   Over Active Bladder   HPI: Logan Morgan is a 83 y.o. male with PMH prostate cancer s/p brachytherapy with undetectable PSA, small right renal mass on active surveillance, BPH with incomplete bladder emptying on Flomax , and urge incontinence who failed Gemtesa who presents today for symptom recheck on trospium  IR 20 mg twice daily.   Today he reports no urge incontinence on trospium , however he has developed bothersome dry mouth and constipation.  He is going up to 5 days without a BM and trying to manage this with stool softeners.  PVR , previously 309 mL.  PMH: Past Medical History:  Diagnosis Date   Atrial fibrillation (HCC)    COPD (chronic obstructive pulmonary disease) (HCC)    Dyspnea    Pt reports due to inactivity   Dysrhythmia    Emphysema lung (HCC)    Prostate cancer [facility])     Surgical History: Past Surgical History:  Procedure Laterality Date   CATARACT EXTRACTION W/PHACO Left 09/15/2021   Procedure: CATARACT EXTRACTION PHACO AND INTRAOCULAR LENS PLACEMENT (IOC) LEFT 12.74 01:04.8;  Surgeon: Myrna Adine Anes, MD;  Location: [facility];  Service: Ophthalmology;  Laterality: Left;   CATARACT EXTRACTION W/PHACO Right 09/29/2021   Procedure: CATARACT EXTRACTION PHACO AND INTRAOCULAR LENS PLACEMENT (IOC) RIGHT;  Surgeon: Myrna Adine Anes, MD;  Location: [facility];  Service: Ophthalmology;  Laterality: Right;  14.81 01:15.8   COLONOSCOPY     COLONOSCOPY WITH PROPOFOL  N/A 10/28/2021   Procedure: COLONOSCOPY WITH PROPOFOL ;  Surgeon: Jinny Carmine, MD;  Location: [facility] END[facility]COPY;  Service: Endoscopy;  Laterality: N/A;   ESOPHAGOGASTRODUODENOSCOPY N/A 10/28/2021   Procedure: ESOPHAGOGASTRODUODENOSCOPY (EGD);  Surgeon: Jinny Carmine, MD;  Location: [facility] ENDOSCOPY;  Service: Endoscopy;  Laterality: N/A;   EYE SURGERY     GIVENS  CAPSULE STUDY N/A 11/24/2021   Procedure: GIVENS CAPSULE STUDY;  Surgeon: Jinny Carmine, MD;  Location: [facility] END[facility]COPY;  Service: Endoscopy;  Laterality: N/A;   HAND SURGERY     x3. as child   RADIOACTIVE SEED IMPLANT     TONSILLECTOMY     as child    Home Medications:  Allergies as of 03/07/2024   No Known Allergies      Medication List        Accurate as of March 07, 2024 11:59 PM. If you have any questions, ask your nurse or doctor.          STOP taking these medications    trospium  20 MG tablet Commonly known as: SANCTURA  Replaced by: Trospium  Chloride 60 MG Cp24       TAKE these medications    albuterol  108 (90 Base) MCG/ACT inhaler Commonly known as: VENTOLIN  HFA Inhale 2 puffs into the lungs every 6 (six) hours as needed for wheezing or shortness of breath. DX: J43.2   amiodarone  200 MG tablet Commonly known as: PACERONE  Take 200 mg by mouth daily.   atorvastatin 20 MG tablet Commonly known as: LIPITOR Take 20 mg by mouth daily.   Bevespi  Aerosphere 9-4.8 MCG/ACT Aero Generic drug: Glycopyrrolate-Formoterol  Inhale 2 puffs into the lungs 2 (two) times daily.   ciprofloxacin  250 MG tablet Commonly known as: Cipro  Take 1 tablet (250 mg total) by mouth 2 (two) times daily.   ferrous sulfate 325 (65 FE) MG tablet Take 325 mg by mouth daily.   Fish Oil 1000 MG Caps  Take by mouth daily.   furosemide 20 MG tablet Commonly known as: LASIX Take 20 mg by mouth daily.   glucosamine-chondroitin 500-400 MG tablet Take 1 tablet by mouth 2 (two) times daily.   metoprolol  succinate 25 MG 24 hr tablet Commonly known as: TOPROL -XL Take 25 mg by mouth daily.   olmesartan 40 MG tablet Commonly known as: BENICAR Take by mouth.   ONE-A-DAY MENS PO Take 1 tablet by mouth daily.   potassium chloride  SA 20 MEQ tablet Commonly known as: KLOR-CON  M Take 1 tablet (20 mEq total) by mouth daily. What changed: when to take this   pravastatin  40 MG  tablet Commonly known as: PRAVACHOL  Take 40 mg by mouth daily.   rivaroxaban  20 MG Tabs tablet Commonly known as: XARELTO  Take 20 mg by mouth daily with supper.   sertraline  100 MG tablet Commonly known as: ZOLOFT  Take 150 mg by mouth daily.   tamsulosin  0.4 MG Caps capsule Commonly known as: FLOMAX  Take 0.4 mg by mouth daily.   Trospium  Chloride 60 MG Cp24 Take 1 capsule (60 mg total) by mouth daily. Replaces: trospium  20 MG tablet        Allergies:  No Known Allergies  Family History: Family History  Problem Relation Age of Onset   COPD Mother     Social History:   reports that he quit smoking about 27 years ago. His smoking use included cigarettes. He started smoking about 65 years ago. He has never used smokeless tobacco. He reports current alcohol use of about 18.0 standard drinks of alcohol per week. He reports that he does not use drugs.  Physical Exam: BP 109/64 (BP Location: Left Arm, Patient Position: Sitting, Cuff Size: Normal)   Pulse 77   Ht 5' 8 (1.727 m)   Wt 158 lb (71.7 kg)   SpO2 92%   BMI 24.02 kg/m   Constitutional:  Alert and oriented, no acute distress, nontoxic appearing HEENT: [place], AT Cardiovascular: No clubbing, cyanosis, or edema Respiratory: Normal respiratory effort, no increased work of breathing Skin: No rashes, bruises or suspicious lesions Neurologic: Grossly intact, no focal deficits, moving all 4 extremities Psychiatric: Normal mood and affect  Laboratory Data: Results for orders placed or performed in visit on 03/07/24  Bladder Scan (Post Void Residual) in office   Collection Time: 03/07/24 10:13 AM  Result Value Ref Range   Scan Result    Assessment & Plan:   1. Urge incontinence (Primary) PVR stable.  Significant improvement in urge incontinence on trospium  IR, however he has had developed bothersome anticholinergic side effects.  Will switch to trospium  ER 60 mg daily and see him back in 6 weeks for symptom  recheck.  May consider Myrbetriq or nonpharmacologic options including PTNS as an alternative. - Bladder Scan (Post Void Residual) in office - Trospium  Chloride 60 MG CP24; Take 1 capsule (60 mg total) by mouth daily.  Dispense: 30 capsule; Refill: 1   Return in about 6 weeks (around 04/18/2024) for Symptom recheck with PVR.  Lucie Hones, PA-C  [facility] [place] [address], [address] [place], [place] [address] [phone]

## 2024-03-13 ENCOUNTER — Encounter

## 2024-03-13 DIAGNOSIS — J449 Chronic obstructive pulmonary disease, unspecified: Secondary | ICD-10-CM | POA: Diagnosis not present

## 2024-03-13 NOTE — Progress Notes (Signed)
 Discharge Summary Patient: Logan Morgan DOB: 1940-09-01  Kirubel graduated today from  rehab with 34 sessions completed.  Details of the patient's exercise prescription and what He needs to do in order to continue the prescription and progress were discussed with patient.  Patient was given a copy of prescription and goals.  Patient verbalized understanding. Kierre plans to continue to exercise by walking at home.   6 Minute Walk     Row Name 12/20/23 1105 03/03/24 1118       6 Minute Walk   Phase Initial Discharge    Distance 1020 feet 900 feet    Distance % Change -- -11 %    Distance Feet Change -- -120 ft    Walk Time 6 minutes --    # of Rest Breaks 0 1    MPH 1.93 1.8    METS 2.07 2.2    RPE 13 15    Perceived Dyspnea  2 3    VO2 Peak 7.24 7.69    Symptoms No No    Resting HR 77 bpm 88 bpm    Resting BP 94/52 110/60    Resting Oxygen Saturation  90 % 90 %    Exercise Oxygen Saturation  during 6 min walk 86 % 84 %    Max Ex. HR 102 bpm 117 bpm    Max Ex. BP 146/56 160/64    2 Minute Post BP 104/52 120/68      Interval HR   1 Minute HR 95 96    2 Minute HR 97 102    3 Minute HR 100 109    4 Minute HR 101 117    5 Minute HR 102 112    6 Minute HR 99 113    2 Minute Post HR 70 94    Interval Heart Rate? Yes Yes      Interval Oxygen   Interval Oxygen? Yes Yes    Baseline Oxygen Saturation % 90 % 90 %    1 Minute Oxygen Saturation % 92 % 87 %    1 Minute Liters of Oxygen 0 L 0 L    2 Minute Oxygen Saturation % 90 % 84 %    2 Minute Liters of Oxygen 0 L 0 L    3 Minute Oxygen Saturation % 88 % 84 %    3 Minute Liters of Oxygen 0 L 0 L    4 Minute Oxygen Saturation % 87 % 85 %    4 Minute Liters of Oxygen 0 L 0 L    5 Minute Oxygen Saturation % 87 % 84 %    5 Minute Liters of Oxygen 0 L 0 L    6 Minute Oxygen Saturation % 86 % 86 %    6 Minute Liters of Oxygen 0 L 0 L    2 Minute Post Oxygen Saturation % 97 % 93 %    2 Minute Post Liters of Oxygen 0 L 0 L

## 2024-03-13 NOTE — Progress Notes (Signed)
 Pulmonary Individual Treatment Plan  Patient Details  Name: Logan Morgan MRN: [id number] Date of Birth: April 01, 1941 Referring Provider:   Flowsheet Row Pulmonary Rehab from 12/20/2023 in [facility] Cardiac and Pulmonary Rehab  Referring Provider Dr. Halina Picking    Initial Encounter Date:  Flowsheet Row Pulmonary Rehab from 12/20/2023 in [facility] Cardiac and Pulmonary Rehab  Date 12/20/23    Visit Diagnosis: Chronic obstructive pulmonary disease, unspecified COPD type (HCC)  Patient's Home Medications on Admission:  Current Outpatient Medications:    albuterol  (VENTOLIN  HFA) 108 (90 Base) MCG/ACT inhaler, Inhale 2 puffs into the lungs every 6 (six) hours as needed for wheezing or shortness of breath. DX: J43.2, Disp: 24 g, Rfl: 2   amiodarone  (PACERONE ) 200 MG tablet, Take 200 mg by mouth daily., Disp: , Rfl:    atorvastatin (LIPITOR) 20 MG tablet, Take 20 mg by mouth daily., Disp: , Rfl:    ciprofloxacin  (CIPRO ) 250 MG tablet, Take 1 tablet (250 mg total) by mouth 2 (two) times daily., Disp: 14 tablet, Rfl: 0   ferrous sulfate 325 (65 FE) MG tablet, Take 325 mg by mouth daily., Disp: , Rfl:    furosemide (LASIX) 20 MG tablet, Take 20 mg by mouth daily., Disp: , Rfl:    glucosamine-chondroitin 500-400 MG tablet, Take 1 tablet by mouth 2 (two) times daily. , Disp: , Rfl:    Glycopyrrolate-Formoterol  (BEVESPI  AEROSPHERE) 9-4.8 MCG/ACT AERO, Inhale 2 puffs into the lungs 2 (two) times daily., Disp: 32.1 g, Rfl: 3   metoprolol  succinate (TOPROL -XL) 25 MG 24 hr tablet, Take 25 mg by mouth daily., Disp: , Rfl:    Multiple Vitamin (ONE-A-DAY MENS PO), Take 1 tablet by mouth daily., Disp: , Rfl:    olmesartan (BENICAR) 40 MG tablet, Take by mouth., Disp: , Rfl:    Omega-3 Fatty Acids (FISH OIL) 1000 MG CAPS, Take by mouth daily., Disp: , Rfl:    potassium chloride  SA (K-DUR,KLOR-CON ) 20 MEQ tablet, Take 1 tablet (20 mEq total) by mouth daily. (Patient taking differently: Take 20 mEq by mouth 2 (two)  times daily.), Disp: 30 tablet, Rfl: 6   pravastatin  (PRAVACHOL ) 40 MG tablet, Take 40 mg by mouth daily., Disp: , Rfl:    rivaroxaban  (XARELTO ) 20 MG TABS tablet, Take 20 mg by mouth daily with supper., Disp: , Rfl:    sertraline  (ZOLOFT ) 100 MG tablet, Take 150 mg by mouth daily., Disp: , Rfl:    tamsulosin  (FLOMAX ) 0.4 MG CAPS capsule, Take 0.4 mg by mouth daily., Disp: , Rfl:    Trospium  Chloride 60 MG CP24, Take 1 capsule (60 mg total) by mouth daily., Disp: 30 capsule, Rfl: 1  Past Medical History: Past Medical History:  Diagnosis Date   Atrial fibrillation (HCC)    COPD (chronic obstructive pulmonary disease) (HCC)    Dyspnea    Pt reports due to inactivity   Dysrhythmia    Emphysema lung (HCC)    Prostate cancer (HCC)     Tobacco Use: Social History   Tobacco Use  Smoking Status Former   Current packs/day: 0.00   Types: Cigarettes   Start date: 88   Quit date: 1998   Years since quitting: 27.7  Smokeless Tobacco Never    Labs: Review Flowsheet       Latest Ref Rng & Units 07/29/2016  Labs for ITP Cardiac and Pulmonary Rehab  Cholestrol 0 - 200 mg/dL 834   LDL (calc) 0 - 99 mg/dL 93   HDL-C >59 mg/dL 51  Trlycerides <150 mg/dL 896      Pulmonary Assessment Scores:  Pulmonary Assessment Scores     Row Name 12/20/23 1113 03/03/24 1124 03/06/24 1113     ADL UCSD   ADL Phase Entry Exit Exit   SOB Score total 11 -- 53   Rest 0 -- 0   Walk 2 -- 3   Stairs 4 -- 4   Bath 1 -- 0   Dress 2 -- 0   Shop 2 -- 1     CAT Score   CAT Score 18 -- 12     mMRC Score   mMRC Score 2 2 --      UCSD: Self-administered rating of dyspnea associated with activities of daily living (ADLs) 6-point scale (0 = not at all to 5 = maximal or unable to do because of breathlessness)  Scoring Scores range from 0 to 120.  Minimally important difference is 5 units  CAT: CAT can identify the health impairment of COPD patients and is better correlated with disease  progression.  CAT has a scoring range of zero to 40. The CAT score is classified into four groups of low (less than 10), medium (10 - 20), high (21-30) and very high (31-40) based on the impact level of disease on health status. A CAT score over 10 suggests significant symptoms.  A worsening CAT score could be explained by an exacerbation, poor medication adherence, poor inhaler technique, or progression of COPD or comorbid conditions.  CAT MCID is 2 points  mMRC: mMRC (Modified Medical Research Council) Dyspnea Scale is used to assess the degree of baseline functional disability in patients of respiratory disease due to dyspnea. No minimal important difference is established. A decrease in score of 1 point or greater is considered a positive change.   Pulmonary Function Assessment:   Exercise Target Goals: Exercise Program Goal: Individual exercise prescription set using results from initial 6 min walk test and THRR while considering  patient's activity barriers and safety.   Exercise Prescription Goal: Initial exercise prescription builds to 30-45 minutes a day of aerobic activity, 2-3 days per week.  Home exercise guidelines will be given to patient during program as part of exercise prescription that the participant will acknowledge.  Education: Aerobic Exercise: - Group verbal and visual presentation on the components of exercise prescription. Introduces F.I.T.T principle from ACSM for exercise prescriptions.  Reviews F.I.T.T. principles of aerobic exercise including progression. Written material provided at class time.   Education: Resistance Exercise: - Group verbal and visual presentation on the components of exercise prescription. Introduces F.I.T.T principle from ACSM for exercise prescriptions  Reviews F.I.T.T. principles of resistance exercise including progression. Written material provided at class time. Flowsheet Row Pulmonary Rehab from 03/01/2024 in [facility] Cardiac and Pulmonary  Rehab  Date 01/05/24  Educator nt  Instruction Review Code 1- TEFL teacher Understanding     Education: Exercise & Equipment Safety: - Individual verbal instruction and demonstration of equipment use and safety with use of the equipment. Flowsheet Row Pulmonary Rehab from 03/01/2024 in [facility] Cardiac and Pulmonary Rehab  Date 12/20/23  Educator [facility]  Instruction Review Code 1- Verbalizes Understanding    Education: Exercise Physiology & General Exercise Guidelines: - Group verbal and written instruction with models to review the exercise physiology of the cardiovascular system and associated critical values. Provides general exercise guidelines with specific guidelines to those with heart or lung disease.  Flowsheet Row Pulmonary Rehab from 03/01/2024 in [facility] Cardiac and Pulmonary Rehab  Education  need identified 12/20/23  Date 12/29/23  Educator nt  Instruction Review Code 1- [employer] Understanding    Education: Flexibility, Balance, Mind/Body Relaxation: - Group verbal and visual presentation with interactive activity on the components of exercise prescription. Introduces F.I.T.T principle from ACSM for exercise prescriptions. Reviews F.I.T.T. principles of flexibility and balance exercise training including progression. Also discusses the mind body connection.  Reviews various relaxation techniques to help reduce and manage stress (i.e. Deep breathing, progressive muscle relaxation, and visualization). Balance handout provided to take home. Written material provided at class time. Flowsheet Row Pulmonary Rehab from 03/01/2024 in [facility] Cardiac and Pulmonary Rehab  Date 01/05/24  Educator nt  Instruction Review Code 1- Verbalizes Understanding    Activity Barriers & Risk Stratification:  Activity Barriers & Cardiac Risk Stratification - 12/20/23 1107       Activity Barriers & Cardiac Risk Stratification   Activity Barriers Muscular Weakness;Shortness of Breath;Balance Concerns           6 Minute Walk:  6 Minute Walk     Row Name 12/20/23 1105 03/03/24 1118       6 Minute Walk   Phase Initial Discharge    Distance 1020 feet 900 feet    Distance % Change -- -11 %    Distance Feet Change -- -120 ft    Walk Time 6 minutes --    # of Rest Breaks 0 1    MPH 1.93 1.8    METS 2.07 2.2    RPE 13 15    Perceived Dyspnea  2 3    VO2 Peak 7.24 7.69    Symptoms No No    Resting HR 77 bpm 88 bpm    Resting BP 94/52 110/60    Resting Oxygen Saturation  90 % 90 %    Exercise Oxygen Saturation  during 6 min walk 86 % 84 %    Max Ex. HR 102 bpm 117 bpm    Max Ex. BP 146/56 160/64    2 Minute Post BP 104/52 120/68      Interval HR   1 Minute HR 95 96    2 Minute HR 97 102    3 Minute HR 100 109    4 Minute HR 101 117    5 Minute HR 102 112    6 Minute HR 99 113    2 Minute Post HR 70 94    Interval Heart Rate? Yes Yes      Interval Oxygen   Interval Oxygen? Yes Yes    Baseline Oxygen Saturation % 90 % 90 %    1 Minute Oxygen Saturation % 92 % 87 %    1 Minute Liters of Oxygen 0 L 0 L    2 Minute Oxygen Saturation % 90 % 84 %    2 Minute Liters of Oxygen 0 L 0 L    3 Minute Oxygen Saturation % 88 % 84 %    3 Minute Liters of Oxygen 0 L 0 L    4 Minute Oxygen Saturation % 87 % 85 %    4 Minute Liters of Oxygen 0 L 0 L    5 Minute Oxygen Saturation % 87 % 84 %    5 Minute Liters of Oxygen 0 L 0 L    6 Minute Oxygen Saturation % 86 % 86 %    6 Minute Liters of Oxygen 0 L 0 L    2 Minute Post Oxygen Saturation % 97 %  93 %    2 Minute Post Liters of Oxygen 0 L 0 L      Oxygen Initial Assessment:  Oxygen Initial Assessment - 12/17/23 1411       Home Oxygen   Home Oxygen Device None    Sleep Oxygen Prescription Continuous    Home Exercise Oxygen Prescription None   prn   Home Resting Oxygen Prescription None    Compliance with Home Oxygen Use Yes      Intervention   Short Term Goals To learn and understand importance of monitoring SPO2 with pulse  oximeter and demonstrate accurate use of the pulse oximeter.;To learn and understand importance of maintaining oxygen saturations>88%;To learn and demonstrate proper pursed lip breathing techniques or other breathing techniques. ;To learn and demonstrate proper use of respiratory medications    Long  Term Goals Verbalizes importance of monitoring SPO2 with pulse oximeter and return demonstration;Maintenance of O2 saturations>88%;Compliance with respiratory medication;Demonstrates proper use of MDI's;Exhibits proper breathing techniques, such as pursed lip breathing or other method taught during program session          Oxygen Re-Evaluation:  Oxygen Re-Evaluation     Row Name 12/24/23 1055 01/10/24 1129 01/17/24 1110 02/28/24 1116       Program Oxygen Prescription   Program Oxygen Prescription -- None Continuous Continuous    Liters per minute -- -- 2 2    Comments -- -- on Exertion and prn. on Exertion and prn.      Home Oxygen   Home Oxygen Device None None Home Concentrator Home Concentrator    Sleep Oxygen Prescription Continuous Continuous Continuous Continuous    Liters per minute -- 2  prn 2 2    Home Exercise Oxygen Prescription None None Continuous Continuous    Liters per minute -- -- 2 2    Home Resting Oxygen Prescription None None None None    Compliance with Home Oxygen Use Yes Yes Yes Yes      Goals/Expected Outcomes   Short Term Goals To learn and understand importance of monitoring SPO2 with pulse oximeter and demonstrate accurate use of the pulse oximeter.;To learn and understand importance of maintaining oxygen saturations>88%;To learn and demonstrate proper pursed lip breathing techniques or other breathing techniques. ;To learn and demonstrate proper use of respiratory medications;To learn and exhibit compliance with exercise, home and travel O2 prescription To learn and understand importance of maintaining oxygen saturations>88%;To learn and understand importance of  monitoring SPO2 with pulse oximeter and demonstrate accurate use of the pulse oximeter. To learn and demonstrate proper use of respiratory medications;To learn and understand importance of maintaining oxygen saturations>88% Other    Long  Term Goals Verbalizes importance of monitoring SPO2 with pulse oximeter and return demonstration;Maintenance of O2 saturations>88%;Compliance with respiratory medication;Demonstrates proper use of MDI's;Exhibits proper breathing techniques, such as pursed lip breathing or other method taught during program session;Exhibits compliance with exercise, home  and travel O2 prescription Maintenance of O2 saturations>88%;Verbalizes importance of monitoring SPO2 with pulse oximeter and return demonstration Compliance with respiratory medication;Maintenance of O2 saturations>88% Other    Comments Reviewed PLB technique with pt.  Talked about how it works and it's importance in maintaining their exercise saturations. Tomy has a pulse oximeter to check his oxygen saturation at home. Informed and explained why it is important to have one. Reviewed that oxygen saturations should be 88 percent and above. Patient verbalizes understanding. Dade has been desating in rehab when on the treadmill and has been often below 88  percent and gets as low as 85 percent without coming back up unless he is resting. PLB did not bring his oxygen up while on the treadmill. He may not need oxygen on sit down machines and will continue to monitor. Informed Camille that he should bee 88 percent and above with his oxygen when he exercises post [employer]. Explained how he should use an RPE scale to determine how hard he is working along with his heart rate.    Goals/Expected Outcomes Short: Become more profiecient at using PLB. Long: Become independent at using PLB. Short: monitor oxygen at home with exertion. Long: maintain oxygen saturations above 88 percent independently. Short: maintain o2 sats above 88 percent on  exertion. Long: use o2 as need in rehab and at home. Short: continue to monitor vitals post [employer]. Long: maintain oxygen and exrcise independently.       Oxygen Discharge (Final Oxygen Re-Evaluation):  Oxygen Re-Evaluation - 02/28/24 1116       Program Oxygen Prescription   Program Oxygen Prescription Continuous    Liters per minute 2    Comments on Exertion and prn.      Home Oxygen   Home Oxygen Device Home Concentrator    Sleep Oxygen Prescription Continuous    Liters per minute 2    Home Exercise Oxygen Prescription Continuous    Liters per minute 2    Home Resting Oxygen Prescription None    Compliance with Home Oxygen Use Yes      Goals/Expected Outcomes   Short Term Goals Other    Long  Term Goals Other    Comments Informed Zariah that he should bee 88 percent and above with his oxygen when he exercises post [employer]. Explained how he should use an RPE scale to determine how hard he is working along with his heart rate.    Goals/Expected Outcomes Short: continue to monitor vitals post [employer]. Long: maintain oxygen and exrcise independently.          Initial Exercise Prescription:  Initial Exercise Prescription - 12/20/23 1100       Date of Initial Exercise RX and Referring Provider   Date 12/20/23    Referring Provider Dr. Fuad Aleskerov      Oxygen   Oxygen Continuous    Liters 0-1L   As needed   Maintain Oxygen Saturation 88% or higher      Treadmill   MPH 2    Grade 0    Minutes 15    METs 2.53      NuStep   Level 2    SPM 80    Minutes 15    METs 2      Arm Ergometer   Level 1    Watts 25    RPM 25    Minutes 15    METs 2      REL-XR   Level 2    Watts 25    Speed 50    Minutes 15    METs 2      T5 Nustep   Level 2    SPM 80    Minutes 15    METs 2      Track   Laps 27    Minutes 15    METs 2      Prescription Details   Duration Progress to 30 minutes of continuous aerobic without signs/symptoms of physical distress       Intensity   THRR 40-80% of Max Heartrate 101-125  Ratings of Perceived Exertion 11-13    Perceived Dyspnea 0-4      Progression   Progression Continue to progress workloads to maintain intensity without signs/symptoms of physical distress.      Resistance Training   Training Prescription Yes    Weight 5lb    Reps 10-15          Perform Capillary Blood Glucose checks as needed.  Exercise Prescription Changes:   Exercise Prescription Changes     Row Name 12/20/23 1100 12/29/23 0700 01/13/24 1800 01/27/24 0900 02/09/24 1200     Response to Exercise   Blood Pressure (Admit) 94/52 118/60 116/52 108/54 --   Blood Pressure (Exercise) 146/56 132/82 156/60 124/60 --   Blood Pressure (Exit) 104/52 102/62 98/52 112/50 --   Heart Rate (Admit) 77 bpm 69 bpm 85 bpm 76 bpm --   Heart Rate (Exercise) 102 bpm 81 bpm 97 bpm 99 bpm --   Heart Rate (Exit) 70 bpm 70 bpm 87 bpm 77 bpm --   Oxygen Saturation (Admit) 90 % 92 % 90 % 90 % --   Oxygen Saturation (Exercise) 86 % 90 % 87 % 87 % --   Oxygen Saturation (Exit) 97 % 92 % 89 % 92 % --   Rating of Perceived Exertion (Exercise) 13 15 15 15  --   Perceived Dyspnea (Exercise) 2 1 3 3  --   Symptoms SOB none none none --   Comments results first 2 weeks of exercise -- -- --   Duration -- Progress to 30 minutes of  aerobic without signs/symptoms of physical distress Progress to 30 minutes of  aerobic without signs/symptoms of physical distress Progress to 30 minutes of  aerobic without signs/symptoms of physical distress Progress to 30 minutes of  aerobic without signs/symptoms of physical distress   Intensity -- THRR unchanged THRR unchanged THRR unchanged THRR unchanged     Progression   Progression -- Continue to progress workloads to maintain intensity without signs/symptoms of physical distress. Continue to progress workloads to maintain intensity without signs/symptoms of physical distress. Continue to progress workloads to  maintain intensity without signs/symptoms of physical distress. Continue to progress workloads to maintain intensity without signs/symptoms of physical distress.   Average METs -- 2.2 2.43 2.4 2.4     Resistance Training   Training Prescription -- Yes Yes Yes Yes   Weight -- 5lb 5lb 5lb 5lb   Reps -- 10-15 10-15 10-15 10-15     Interval Training   Interval Training -- No No No No     Oxygen   Oxygen -- Continuous -- Continuous Continuous   Liters -- 0-1L  As needed -- 2L 2L     Treadmill   MPH -- -- 2 1.5 1.5   Grade -- -- 0 0 0   Minutes -- -- 15 15 15    METs -- -- 2.53 2.15 2.15     Recumbant Bike   Level -- -- 2 4 4    RPM -- -- 50 -- --   Watts -- -- 25 26 26    Minutes -- -- 15 15 15    METs -- -- 2.7 3.12 3.12     NuStep   Level -- 2 5 4 4    Minutes -- 15 15 15 15    METs -- 2.4 2.5 2.2 2.2     Arm Ergometer   Level -- 1 1 1 1    Watts -- 15 15 -- --   Minutes -- 15 15  15 15   METs -- 2.09 2.2 2.09 2.09     REL-XR   Level -- -- 2 3 3    Minutes -- -- 15 15 15      T5 Nustep   Level -- -- 3 -- --   Minutes -- -- 15 -- --   METs -- -- 2.2 -- --     Home Exercise Plan   Plans to continue exercise at -- -- -- -- Home (comment)  walk   Frequency -- -- -- -- Add 2 additional days to program exercise sessions.   Initial Home Exercises Provided -- -- -- -- 02/09/24     Oxygen   Maintain Oxygen Saturation -- 88% or higher 88% or higher 88% or higher 88% or higher    Row Name 02/10/24 1600 02/24/24 0800 03/06/24 1000         Response to Exercise   Blood Pressure (Admit) 108/58 118/54 110/60     Blood Pressure (Exit) 112/60 108/58 98/62     Heart Rate (Admit) 90 bpm 80 bpm 87 bpm     Heart Rate (Exercise) 106 bpm 107 bpm 106 bpm     Heart Rate (Exit) 97 bpm 70 bpm 96 bpm     Oxygen Saturation (Admit) 91 % 91 % 89 %     Oxygen Saturation (Exercise) 87 % 87 %  came up to 88 89 %     Oxygen Saturation (Exit) 93 % 92 % 89 %     Rating of Perceived Exertion  (Exercise) 13 14 13      Perceived Dyspnea (Exercise) 3 2 3      Symptoms none none none     Duration Progress to 30 minutes of  aerobic without signs/symptoms of physical distress Progress to 30 minutes of  aerobic without signs/symptoms of physical distress Progress to 30 minutes of  aerobic without signs/symptoms of physical distress     Intensity THRR unchanged THRR unchanged THRR unchanged       Progression   Progression Continue to progress workloads to maintain intensity without signs/symptoms of physical distress. Continue to progress workloads to maintain intensity without signs/symptoms of physical distress. Continue to progress workloads to maintain intensity without signs/symptoms of physical distress.     Average METs 2.4 2.41 2.6       Resistance Training   Training Prescription Yes Yes Yes     Weight 5lb 5lb 5lb     Reps 10-15 10-15 10-15       Interval Training   Interval Training No No No       Oxygen   Oxygen Continuous Continuous Continuous     Liters 2L 2L 2L       Treadmill   MPH 1.6 -- 1.7     Grade 0 -- 0     Minutes 15 -- 15     METs 2.23 -- 2.3       Recumbant Bike   Level 4 5 5      Watts 23 26 22      Minutes 15 15 15      METs 3.13 3.13 2.98       NuStep   Level 1 4 5      Minutes 15 15 15      METs 1.8 2.4 3       Arm Ergometer   Level -- 1 3     Watts -- 15 15     Minutes -- 15 15     METs --  2.09 2.1       REL-XR   Level -- -- 4     Minutes -- -- 15     METs -- -- 3.9       T5 Nustep   Level 4 4 --     Minutes 15 15 --     METs 2 1.9 --       Track   Laps -- -- 32     Minutes -- -- 15     METs -- -- 2.74       Home Exercise Plan   Plans to continue exercise at Home (comment)  walk Home (comment)  walk Home (comment)  walk     Frequency Add 2 additional days to program exercise sessions. Add 2 additional days to program exercise sessions. Add 2 additional days to program exercise sessions.     Initial Home Exercises Provided  02/09/24 02/09/24 02/09/24       Oxygen   Maintain Oxygen Saturation 88% or higher 88% or higher 88% or higher        Exercise Comments:   Exercise Comments     Row Name 12/24/23 1054 03/13/24 1117         Exercise Comments First full day of exercise!  Patient was oriented to gym and equipment including functions, settings, policies, and procedures.  Patient's individual exercise prescription and treatment plan were reviewed.  All starting workloads were established based on the results of the 6 minute walk test done at initial orientation visit.  The plan for exercise progression was also introduced and progression will be customized based on patient's performance and goals. Takeru graduated today from  rehab with 34 sessions completed.  Details of the patient's exercise prescription and what He needs to do in order to continue the prescription and progress were discussed with patient.  Patient was given a copy of prescription and goals.  Patient verbalized understanding. Tashi plans to continue to exercise by walking at home.         Exercise Goals and Review:   Exercise Goals     Row Name 12/20/23 1111             Exercise Goals   Increase Physical Activity Yes       Intervention Provide advice, education, support and counseling about physical activity/exercise needs.;Develop an individualized exercise prescription for aerobic and resistive training based on initial evaluation findings, risk stratification, comorbidities and participant's personal goals.       Expected Outcomes Short Term: Attend rehab on a regular basis to increase amount of physical activity.;Long Term: Add in home exercise to make exercise part of routine and to increase amount of physical activity.;Long Term: Exercising regularly at least 3-5 days a week.       Increase Strength and Stamina Yes       Intervention Provide advice, education, support and counseling about physical activity/exercise needs.;Develop an  individualized exercise prescription for aerobic and resistive training based on initial evaluation findings, risk stratification, comorbidities and participant's personal goals.       Expected Outcomes Short Term: Increase workloads from initial exercise prescription for resistance, speed, and METs.;Long Term: Improve cardiorespiratory fitness, muscular endurance and strength as measured by increased METs and functional capacity ( );Short Term: Perform resistance training exercises routinely during rehab and add in resistance training at home       Able to understand and use rate of perceived exertion (RPE) scale Yes  Intervention Provide education and explanation on how to use RPE scale       Expected Outcomes Short Term: Able to use RPE daily in rehab to express subjective intensity level;Long Term:  Able to use RPE to guide intensity level when exercising independently       Able to understand and use Dyspnea scale Yes       Intervention Provide education and explanation on how to use Dyspnea scale       Expected Outcomes Short Term: Able to use Dyspnea scale daily in rehab to express subjective sense of shortness of breath during exertion;Long Term: Able to use Dyspnea scale to guide intensity level when exercising independently       Knowledge and understanding of Target Heart Rate Range (THRR) Yes       Intervention Provide education and explanation of THRR including how the numbers were predicted and where they are located for reference       Expected Outcomes Short Term: Able to state/look up THRR;Short Term: Able to use daily as guideline for intensity in rehab;Long Term: Able to use THRR to govern intensity when exercising independently       Able to check pulse independently Yes       Intervention Provide education and demonstration on how to check pulse in carotid and radial arteries.;Review the importance of being able to check your own pulse for safety during independent exercise        Expected Outcomes Short Term: Able to explain why pulse checking is important during independent exercise;Long Term: Able to check pulse independently and accurately       Understanding of Exercise Prescription Yes       Intervention Provide education, explanation, and written materials on patient's individual exercise prescription       Expected Outcomes Short Term: Able to explain program exercise prescription;Long Term: Able to explain home exercise prescription to exercise independently          Exercise Goals Re-Evaluation :  Exercise Goals Re-Evaluation     Row Name 12/24/23 1055 12/29/23 0747 01/13/24 1809 01/27/24 0929 02/09/24 1204     Exercise Goal Re-Evaluation   Exercise Goals Review Increase Physical Activity;Able to understand and use rate of perceived exertion (RPE) scale;Knowledge and understanding of Target Heart Rate Range (THRR);Understanding of Exercise Prescription;Increase Strength and Stamina;Able to understand and use Dyspnea scale;Able to check pulse independently Increase Physical Activity;Increase Strength and Stamina;Understanding of Exercise Prescription Increase Physical Activity;Increase Strength and Stamina;Understanding of Exercise Prescription Increase Physical Activity;Increase Strength and Stamina;Understanding of Exercise Prescription Increase Physical Activity;Able to understand and use rate of perceived exertion (RPE) scale;Knowledge and understanding of Target Heart Rate Range (THRR);Understanding of Exercise Prescription;Increase Strength and Stamina;Able to understand and use Dyspnea scale;Able to check pulse independently   Comments Reviewed RPE and dyspnea scale, THR and program prescription with pt today.  Pt voiced understanding and was given a copy of goals to take home. Pearley is doing well in rehab. He was able to attend his first and only session during this review period. During his one session he was able to use the T4 nustep at level 2, and the  arm ergometer at level 1. We will continue to monitor his progress in the program. Jeffry is doing well in rehab. He increased his level on the T4 nustep to 5 and increased to level 3 on the T5 nustep. He added the recumbent bike at level 2. He maintained level 2 on the XR and maintained  a speed of 2 mph with no incline on the treadmill. We will continue to monitor his progress in the program. Mandeep continues to do well in rehab. He was recently able to increase from level 2 to 4 on the recumbent bike. He was also able to increase from level 2 to 3 on the XR. We will continue to monitor his progress in the program. Reviewed home exercise with pt today from 11:30am to 11:40am.  Pt plans to walk for exercise.  Reviewed THR, pulse, RPE, sign and symptoms, pulse oximetery and when to call 911 or MD.  Also discussed weather considerations and indoor options.  Pt voiced understanding.   Expected Outcomes Short: Use RPE daily to regulate intensity. Long: Follow program prescription in THR. Short: Continue to follow exercise prescription. Long: Continue exercise to improve strength and stamina. Short: Continue to progressively increase treadmill workload. Long: Continue exercise to improve strength and stamina. Short: Continue to follow exercise prescription. Long: Continue exercise to improve strength and stamina. Short: add 1-2 days a week of exercise at home on off days of rehab. Long: maintain independet exercise routine upon graduation from pumonary rehab.    Row Name 02/10/24 1621 02/24/24 0805 03/06/24 1055         Exercise Goal Re-Evaluation   Exercise Goals Review Increase Physical Activity;Increase Strength and Stamina;Understanding of Exercise Prescription Increase Physical Activity;Increase Strength and Stamina;Understanding of Exercise Prescription Increase Physical Activity;Increase Strength and Stamina;Understanding of Exercise Prescription     Comments Bobby continues to do well in rehab. He was able to  increase from level 4 to 5 on the T5 nustep, and increase his speed on the treadmill from 1.5 to 1.27mph. We will continue to monitor and encourage his progress in the program. Key continues to do well in rehab. He increased to level 5 on the recumbent bike and level 4 on the T4 nustep. He maintained level 4 on the T5 nustep and level 1 on the arm ergometer. We will continue to monitor and encourage his progress in the program. Joel is doing well in rehab. He recently completed his post and decreased by 165feet. He was able to increase his speed on the treadmill from 1. to 1. , and increase from level 3 to 4 on the XR. We will continue to monitor his progress in the program.     Expected Outcomes Short: Continue to increase treadmill workload. Long: Continue exercise to improve strength and stamina. Short: Continue to progressively increase nustep and recumbent bike workloads. Long: Continue exercise to improve strength and stamina. Short: Graduate. Long: Continue exercise to improve strength and stamina.        Discharge Exercise Prescription (Final Exercise Prescription Changes):  Exercise Prescription Changes - 03/06/24 1000       Response to Exercise   Blood Pressure (Admit) 110/60    Blood Pressure (Exit) 98/62    Heart Rate (Admit) 87 bpm    Heart Rate (Exercise) 106 bpm    Heart Rate (Exit) 96 bpm    Oxygen Saturation (Admit) 89 %    Oxygen Saturation (Exercise) 89 %    Oxygen Saturation (Exit) 89 %    Rating of Perceived Exertion (Exercise) 13    Perceived Dyspnea (Exercise) 3    Symptoms none    Duration Progress to 30 minutes of  aerobic without signs/symptoms of physical distress    Intensity THRR unchanged      Progression   Progression Continue to progress workloads to maintain intensity  without signs/symptoms of physical distress.    Average METs 2.6      Resistance Training   Training Prescription Yes    Weight 5lb    Reps 10-15      Interval Training    Interval Training No      Oxygen   Oxygen Continuous    Liters 2L      Treadmill   MPH 1.7    Grade 0    Minutes 15    METs 2.3      Recumbant Bike   Level 5    Watts 22    Minutes 15    METs 2.98      NuStep   Level 5    Minutes 15    METs 3      Arm Ergometer   Level 3    Watts 15    Minutes 15    METs 2.1      REL-XR   Level 4    Minutes 15    METs 3.9      Track   Laps 32    Minutes 15    METs 2.74      Home Exercise Plan   Plans to continue exercise at Home (comment)   walk   Frequency Add 2 additional days to program exercise sessions.    Initial Home Exercises Provided 02/09/24      Oxygen   Maintain Oxygen Saturation 88% or higher          Nutrition:  Target Goals: Understanding of nutrition guidelines, daily intake of sodium 1500mg , cholesterol 200mg , calories 30% from fat and 7% or less from saturated fats, daily to have 5 or more servings of fruits and vegetables.  Education: Nutrition 1 -Group instruction provided by verbal, written material, interactive activities, discussions, models, and posters to present general guidelines for heart healthy nutrition including macronutrients, label reading, and promoting whole foods over processed counterparts. Education serves as Pensions consultant of discussion of heart healthy eating for all. Written material provided at class time.     Education: Nutrition 2 -Group instruction provided by verbal, written material, interactive activities, discussions, models, and posters to present general guidelines for heart healthy nutrition including sodium, cholesterol, and saturated fat. Providing guidance of habit forming to improve blood pressure, cholesterol, and body weight. Written material provided at class time. Flowsheet Row Pulmonary Rehab from 03/01/2024 in [facility] Cardiac and Pulmonary Rehab  Date 01/26/24  Educator jg  Instruction Review Code 1- Verbalizes Understanding      Biometrics:  Pre Biometrics  - 12/20/23 1112       Pre Biometrics   Height 5' 8.5 (1.74 m)    Weight 159 lb 6.4 oz (72.3 kg)    Waist Circumference 35.5 inches    Hip Circumference 39 inches    Waist to Hip Ratio 0.91 %    BMI (Calculated) 23.88    Single Leg Stand 3 seconds          Post Biometrics - 03/03/24 1123        Post  Biometrics   Height 5' 8.5 (1.74 m)    Weight 155 lb (70.3 kg)    Waist Circumference 35 inches    Hip Circumference 39 inches    Waist to Hip Ratio 0.9 %    BMI (Calculated) 23.22    Single Leg Stand 3 seconds          Nutrition Therapy Plan and Nutrition Goals:  Nutrition Therapy & Goals -  01/03/24 1038       Nutrition Therapy   Diet Cardiac, Low Na    Protein (specify units) 70-90    Fiber 30 grams    Whole Grain Foods 3 servings    Saturated Fats 15 max. grams    Fruits and Vegetables 5 servings/day    Sodium 2 grams      Personal Nutrition Goals   Nutrition Goal Read labels and reduce sodium intake to below 2300mg . Ideally 1500mg  per day.    Personal Goal #2 Eat 15-30gProtein and 30-60gCarbs at each meal.    Personal Goal #3 Reduce saturated fat, less than 12g per day. Replace bad fats for more heart healthy fats.    Comments Patient eats 2 meals per day mostly. He likes veggies and tries to include them at most of his meals. He uses salt at some of his meals. Spoke with him about cutting back on sodium, with goal of less than 1500mg  per day, or a less strict goal of less than 2300mg  per day. Reviewed Mediterranean diet handout. Educated on types of fats, sources, and how to read facts labels. Provided goal of less than 12g of saturated fat per day. Brainstormed a few meals and snacks with foods he likes and will eat.      Intervention Plan   Intervention Prescribe, educate and counsel regarding individualized specific dietary modifications aiming towards targeted core components such as weight, hypertension, lipid management, diabetes, heart failure and other  comorbidities.;Nutrition handout(s) given to patient.    Expected Outcomes Short Term Goal: Understand basic principles of dietary content, such as calories, fat, sodium, cholesterol and nutrients.;Short Term Goal: A plan has been developed with personal nutrition goals set during dietitian appointment.;Long Term Goal: Adherence to prescribed nutrition plan.          Nutrition Assessments:  MEDIFICTS Score Key: >=70 Need to make dietary changes  40-70 Heart Healthy Diet <= 40 Therapeutic Level Cholesterol Diet  Flowsheet Row Pulmonary Rehab from 03/06/2024 in [facility] Cardiac and Pulmonary Rehab  Picture Your Plate Total Score on Admission 61  Picture Your Plate Total Score on Discharge 59   Picture Your Plate Scores: <59 Unhealthy dietary pattern with much room for improvement. 41-50 Dietary pattern unlikely to meet recommendations for good health and room for improvement. 51-60 More healthful dietary pattern, with some room for improvement.  >60 Healthy dietary pattern, although there may be some specific behaviors that could be improved.   Nutrition Goals Re-Evaluation:  Nutrition Goals Re-Evaluation     Row Name 01/10/24 1126             Goals   Comment Patient was informed on why it is important to maintain a balanced diet when dealing with Respiratory issues. Explained that it takes a lot of energy to breath and when they are short of breath often they will need to have a good diet to help keep up with the calories they are expending for breathing.       Expected Outcome Short: Choose and plan snacks accordingly to patients caloric intake to improve breathing. Long: Maintain a diet independently that meets their caloric intake to aid in daily shortness of breath.          Nutrition Goals Discharge (Final Nutrition Goals Re-Evaluation):  Nutrition Goals Re-Evaluation - 01/10/24 1126       Goals   Comment Patient was informed on why it is important to maintain a balanced  diet when dealing with Respiratory issues.  Explained that it takes a lot of energy to breath and when they are short of breath often they will need to have a good diet to help keep up with the calories they are expending for breathing.    Expected Outcome Short: Choose and plan snacks accordingly to patients caloric intake to improve breathing. Long: Maintain a diet independently that meets their caloric intake to aid in daily shortness of breath.          Psychosocial: Target Goals: Acknowledge presence or absence of significant depression and/or stress, maximize coping skills, provide positive support system. Participant is able to verbalize types and ability to use techniques and skills needed for reducing stress and depression.   Education: Stress, Anxiety, and Depression - Group verbal and visual presentation to define topics covered.  Reviews how body is impacted by stress, anxiety, and depression.  Also discusses healthy ways to reduce stress and to treat/manage anxiety and depression.  Written material provided at class time. Flowsheet Row Pulmonary Rehab from 03/01/2024 in [facility] Cardiac and Pulmonary Rehab  Date 03/01/24  Educator kb  Instruction Review Code 1- [employer] Understanding    Education: Sleep Hygiene -Provides group verbal and written instruction about how sleep can affect your health.  Define sleep hygiene, discuss sleep cycles and impact of sleep habits. Review good sleep hygiene tips.    Initial Review & Psychosocial Screening:  Initial Psych Review & Screening - 12/17/23 1427       Initial Review   Current issues with Current Stress Concerns;History of Depression    Source of Stress Concerns Family      Family Dynamics   Good Support System? Yes      Barriers   Psychosocial barriers to participate in program There are no identifiable barriers or psychosocial needs.;The patient should benefit from training in stress management and relaxation.      Screening  Interventions   Interventions Encouraged to exercise;To provide support and resources with identified psychosocial needs;Provide feedback about the scores to participant    Expected Outcomes Short Term goal: Utilizing psychosocial counselor, staff and physician to assist with identification of specific Stressors or current issues interfering with healing process. Setting desired goal for each stressor or current issue identified.;Long Term Goal: Stressors or current issues are controlled or eliminated.;Short Term goal: Identification and review with participant of any Quality of Life or Depression concerns found by scoring the questionnaire.;Long Term goal: The participant improves quality of Life and PHQ9 Scores as seen by post scores and/or verbalization of changes          Quality of Life Scores:  Scores of 19 and below usually indicate a poorer quality of life in these areas.  A difference of  2-3 points is a clinically meaningful difference.  A difference of 2-3 points in the total score of the Quality of Life Index has been associated with significant improvement in overall quality of life, self-image, physical symptoms, and general health in studies assessing change in quality of life.  PHQ-9: Review Flowsheet       03/06/2024 12/20/2023  Depression screen PHQ 2/9  Decreased Interest 1 0  Down, Depressed, Hopeless 0 0  PHQ - 2 Score 1 0  Altered sleeping 0 0  Tired, decreased energy 1 1  Change in appetite 0 0  Feeling bad or failure about yourself  0 0  Trouble concentrating 0 0  Moving slowly or fidgety/restless 0 0  Suicidal thoughts 0 1  PHQ-9 Score 2 2  Difficult doing work/chores Not difficult at all Not difficult at all   Interpretation of Total Score  Total Score Depression Severity:  1-4 = Minimal depression, 5-9 = Mild depression, 10-14 = Moderate depression, 15-19 = Moderately severe depression, 20-27 = Severe depression   Psychosocial Evaluation and Intervention:   Psychosocial Evaluation - 12/17/23 1427       Psychosocial Evaluation & Interventions   Interventions Encouraged to exercise with the program and follow exercise prescription;Relaxation education;Stress management education    Comments Mr. Dennin is coming to pulmonary rehab with COPD. He notes when he gets very short of breath and/or anxious he loses control of his bladder, so he tries to schedule his activities to prevent that form happening if possible. He is following with a urologist for this issue. When asked about stress, he mentions that his wife has been in the memory care unit for 3 years and he visits almost daily. He also states that he has never been the same mentally since his son and daughter-in-law were murdered during the night  while Mr. Zheng and his wife were staying at their house. His wife and he followed with a therapist for years and he mentions he knows life will never be the same. He wants to keep up his independence and work on his stamina while in the program.    Expected Outcomes Short: attend cardiac rehab for education and exercise Long: develop and maintain positive self care habits.    Continue Psychosocial Services  Follow up required by staff          Psychosocial Re-Evaluation:  Psychosocial Re-Evaluation     Row Name 01/03/24 1045 01/10/24 1127 02/28/24 1123 02/28/24 1132       Psychosocial Re-Evaluation   Current issues with None Identified None Identified None Identified None Identified    Comments Patient reports no issues with their current mental states, sleep, stress, depression or anxiety. Will follow up with patient in a few weeks for any changes. Patient reports no issues with their current mental states, sleep, stress, depression or anxiety. Will follow up with patient in a few weeks for any changes. Patient reports no issues with their current mental states, sleep, stress, depression or anxiety. Patient is graduating soon and wants to continue  working out post [facility]. Burnett reports no issues with his diet at this time.    Expected Outcomes Short: Continue to exercise regularly to support mental health and notify staff of any changes. Long: maintain mental health and well being through teaching of rehab or prescribed medications independently. Short: Continue to exercise regularly to support mental health and notify staff of any changes. Long: maintain mental health and well being through teaching of rehab or prescribed medications independently. Short: Continue to exercise regularly to support mental health and notify staff of any changes. Long: maintain mental health and well being through teaching of rehab or prescribed medications independently. Short: Continue to exercise regularly to support mental health and notify staff of any changes. Long: maintain mental health and well being through teaching of rehab or prescribed medications independently.    Interventions Encouraged to attend Pulmonary Rehabilitation for the exercise Encouraged to attend Pulmonary Rehabilitation for the exercise Encouraged to attend Pulmonary Rehabilitation for the exercise Encouraged to attend Pulmonary Rehabilitation for the exercise    Continue Psychosocial Services  Follow up required by staff Follow up required by staff Follow up required by staff Follow up required by staff       Psychosocial  Discharge (Final Psychosocial Re-Evaluation):  Psychosocial Re-Evaluation - 02/28/24 1132       Psychosocial Re-Evaluation   Current issues with None Identified    Comments Shayan reports no issues with his diet at this time.    Expected Outcomes Short: Continue to exercise regularly to support mental health and notify staff of any changes. Long: maintain mental health and well being through teaching of rehab or prescribed medications independently.    Interventions Encouraged to attend Pulmonary Rehabilitation for the exercise    Continue Psychosocial Services   Follow up required by staff          Education: Education Goals: Education classes will be provided on a weekly basis, covering required topics. Participant will state understanding/return demonstration of topics presented.  Learning Barriers/Preferences:  Learning Barriers/Preferences - 12/17/23 1414       Learning Barriers/Preferences   Learning Barriers None    Learning Preferences None          General Pulmonary Education Topics:  Infection Prevention: - Provides verbal and written material to individual with discussion of infection control including proper hand washing and proper equipment cleaning during exercise session. Flowsheet Row Pulmonary Rehab from 03/01/2024 in [facility] Cardiac and Pulmonary Rehab  Date 12/20/23  Educator [facility]  Instruction Review Code 1- Verbalizes Understanding    Falls Prevention: - Provides verbal and written material to individual with discussion of falls prevention and safety. Flowsheet Row Pulmonary Rehab from 03/01/2024 in [facility] Cardiac and Pulmonary Rehab  Date 12/20/23  Educator [facility]  Instruction Review Code 1- Verbalizes Understanding    Chronic Lung Disease Review: - Group verbal instruction with posters, models, PowerPoint presentations and videos,  to review new updates, new respiratory medications, new advancements in procedures and treatments. Providing information on websites and 800 numbers for continued self-education. Includes information about supplement oxygen, available portable oxygen systems, continuous and intermittent flow rates, oxygen safety, concentrators, and Medicare reimbursement for oxygen. Explanation of Pulmonary Drugs, including class, frequency, complications, importance of spacers, rinsing mouth after steroid MDI's, and proper cleaning methods for nebulizers. Review of basic lung anatomy and physiology related to function, structure, and complications of lung disease. Review of risk factors. Discussion about methods  for diagnosing sleep apnea and types of masks and machines for OSA. Includes a review of the use of types of environmental controls: home humidity, furnaces, filters, dust mite/pet prevention, HEPA vacuums. Discussion about weather changes, air quality and the benefits of nasal washing. Instruction on Warning signs, infection symptoms, calling MD promptly, preventive modes, and value of vaccinations. Review of effective airway clearance, coughing and/or vibration techniques. Emphasizing that all should Create an Action Plan. Written material provided at class time. Flowsheet Row Pulmonary Rehab from 03/01/2024 in [facility] Cardiac and Pulmonary Rehab  Education need identified 12/20/23  Date 02/23/24  Educator jh  Instruction Review Code 1- Verbalizes Understanding    AED/CPR: - Group verbal and written instruction with the use of models to demonstrate the basic use of the AED with the basic ABC's of resuscitation.    Tests and Procedures:  - Group verbal and visual presentation and models provide information about basic cardiac anatomy and function. Reviews the testing methods done to diagnose heart disease and the outcomes of the test results. Describes the treatment choices: Medical Management, Angioplasty, or Coronary Bypass Surgery for treating various heart conditions including Myocardial Infarction, Angina, Valve Disease, and Cardiac Arrhythmias.  Written material provided at class time.   Medication Safety: - Group verbal and visual  instruction to review commonly prescribed medications for heart and lung disease. Reviews the medication, class of the drug, and side effects. Includes the steps to properly store meds and maintain the prescription regimen.  Written material given at graduation. Flowsheet Row Pulmonary Rehab from 03/01/2024 in [facility] Cardiac and Pulmonary Rehab  Date 02/09/24  Educator KB  Instruction Review Code 1- Verbalizes Understanding    Other: -Provides group and verbal  instruction on various topics (see comments)   Knowledge Questionnaire Score:  Knowledge Questionnaire Score - 03/06/24 1110       Knowledge Questionnaire Score   Pre Score 14/18    Post Score 16/18           Core Components/Risk Factors/Patient Goals at Admission:  Personal Goals and Risk Factors at Admission - 12/20/23 1118       Core Components/Risk Factors/Patient Goals on Admission   Improve shortness of breath with ADL's Yes    Intervention Provide education, individualized exercise plan and daily activity instruction to help decrease symptoms of SOB with activities of daily living.    Expected Outcomes Short Term: Improve cardiorespiratory fitness to achieve a reduction of symptoms when performing ADLs;Long Term: Be able to perform more ADLs without symptoms or delay the onset of symptoms    Hypertension Yes    Intervention Provide education on lifestyle modifcations including regular physical activity/exercise, weight management, moderate sodium restriction and increased consumption of fresh fruit, vegetables, and low fat dairy, alcohol moderation, and smoking cessation.;Monitor prescription use compliance.    Expected Outcomes Short Term: Continued assessment and intervention until BP is < 140/60mm HG in hypertensive participants. < 130/38mm HG in hypertensive participants with diabetes, heart failure or chronic kidney disease.;Long Term: Maintenance of blood pressure at goal levels.          Education:Diabetes - Individual verbal and written instruction to review signs/symptoms of diabetes, desired ranges of glucose level fasting, after meals and with exercise. Acknowledge that pre and post exercise glucose checks will be done for 3 sessions at entry of program.   Know Your Numbers and Heart Failure: - Group verbal and visual instruction to discuss disease risk factors for cardiac and pulmonary disease and treatment options.  Reviews associated critical values for  Overweight/Obesity, Hypertension, Cholesterol, and Diabetes.  Discusses basics of heart failure: signs/symptoms and treatments.  Introduces Heart Failure Zone chart for action plan for heart failure. Written material provided at class time. Flowsheet Row Pulmonary Rehab from 03/01/2024 in [facility] Cardiac and Pulmonary Rehab  Date 02/16/24  Educator kb  Instruction Review Code 1- Verbalizes Understanding    Core Components/Risk Factors/Patient Goals Review:   Goals and Risk Factor Review     Row Name 01/03/24 1044 01/10/24 1134 02/28/24 1136         Core Components/Risk Factors/Patient Goals Review   Personal Goals Review Improve shortness of breath with ADL's Other Other;Improve shortness of breath with ADL's     Review Spoke to patient about their shortness of breath and what they can do to improve. Patient has been informed of breathing techniques when starting the program. Patient is informed to tell staff if they have had any med changes and that certain meds they are taking or not taking can be causing shortness of breath. Diaphragmatic and PLB breathing explained and performed with patient. Patient has a better understanding of how to do these exercises to help with breathing performance and relaxation. Patient performed breathing techniques adequately and to practice further at home. Dewaine wants  to walk a little more to see if he can walk at home. Informed him to check his oxygen and use portable oxygen when walking. He is looking into buying a portable concentrator.     Expected Outcomes Short: Attend [facility] regularly to improve shortness of breath with ADL's. Long: maintain independence with ADL's Diaphragmatic and PLB breathing explained and performed with patient. Patient has a better understanding of how to do these exercises to help with breathing performance and relaxation. Patient performed breathing techniques adequately and to practice further at home. Short: walk at home. Long:  maintain waling and oxygen saturation independently.        Core Components/Risk Factors/Patient Goals at Discharge (Final Review):   Goals and Risk Factor Review - 02/28/24 1136       Core Components/Risk Factors/Patient Goals Review   Personal Goals Review Other;Improve shortness of breath with ADL's    Review Camdon wants to walk a little more to see if he can walk at home. Informed him to check his oxygen and use portable oxygen when walking. He is looking into buying a portable concentrator.    Expected Outcomes Short: walk at home. Long: maintain waling and oxygen saturation independently.          ITP Comments:  ITP Comments     Row Name 12/17/23 1434 12/20/23 1104 12/24/23 1054 01/05/24 0816 01/17/24 1109   ITP Comments Initial phone call completed. Diagnosis can be found in [facility] 5/6. EP Orientation scheduled for Monday 7/14 at 9am. Completed and gym orientation for pulmonary rehab. Initial ITP created and sent for review to Dr. Faud Aleskerov, Medical Director. First full day of exercise!  Patient was oriented to gym and equipment including functions, settings, policies, and procedures.  Patient's individual exercise prescription and treatment plan were reviewed.  All starting workloads were established based on the results of the 6 minute walk test done at initial orientation visit.  The plan for exercise progression was also introduced and progression will be customized based on patient's performance and goals. 30 Day review completed. Medical Director ITP review done, changes made as directed, and signed approval by Medical Director. New to program. Placed Keilen on oxygen today since he was not feeling well and his oxygen has been dipping into the mid 80s the last few sessions.    Row Name 02/02/24 0850 03/01/24 1101 03/13/24 1117       ITP Comments 30 Day review completed. Medical Director ITP review done; changes made as directed and signed approval by Medical Director. 30  Day review completed. Medical Director ITP review done, changes made as directed, and signed approval by Medical Director. Bexley graduated today from  rehab with 34 sessions completed.  Details of the patient's exercise prescription and what He needs to do in order to continue the prescription and progress were discussed with patient.  Patient was given a copy of prescription and goals.  Patient verbalized understanding. Arin plans to continue to exercise by walking at home.        Comments: discharge ITP

## 2024-03-13 NOTE — Progress Notes (Signed)
 Daily Session Note  Patient Details  Name: Logan Morgan MRN: [id number] Date of Birth: 07-20-1940 Referring Provider:   Flowsheet Row Pulmonary Rehab from 12/20/2023 in [facility] Cardiac and Pulmonary Rehab  Referring Provider Dr. Halina Picking    Encounter Date: 03/13/2024  Check In:  Session Check In - 03/13/24 1116       Check-In   Supervising physician immediately available to respond to emergencies See telemetry face sheet for immediately available ER MD    Location [facility]-Cardiac & Pulmonary Rehab    Staff Present Burnard Davenport RN,BSN,MPA;Joseph Rolinda RCP,RRT,BSRT;Laura Cates RN,BSN;Vaughn Beaumier Dyane BS, ACSM CEP, Exercise Physiologist    Virtual Visit No    Medication changes reported     No    Fall or balance concerns reported    No    Tobacco Cessation No Change    Warm-up and Cool-down Performed on first and last piece of equipment    Resistance Training Performed Yes    VAD Patient? No    PAD/SET Patient? No      Pain Assessment   Currently in Pain? No/denies             Social History   Tobacco Use  Smoking Status Former   Current packs/day: 0.00   Types: Cigarettes   Start date: 31   Quit date: 1998   Years since quitting: 27.7  Smokeless Tobacco Never    Goals Met:  Proper associated with RPD/PD & O2 Sat Independence with exercise equipment Using PLB without cueing & demonstrates good technique Exercise tolerated well No report of concerns or symptoms today Strength training completed today  Goals Unmet:  Not Applicable  Comments:  Oneil graduated today from  rehab with 34 sessions completed.  Details of the patient's exercise prescription and what He needs to do in order to continue the prescription and progress were discussed with patient.  Patient was given a copy of prescription and goals.  Patient verbalized understanding. Hence plans to continue to exercise by walking at home.    Dr. Oneil Pinal is Medical Director for [facility] Cardiac  Rehabilitation.  Dr. Fuad Aleskerov is Medical Director for [facility] Pulmonary Rehabilitation.

## 2024-03-15 ENCOUNTER — Encounter

## 2024-03-17 ENCOUNTER — Ambulatory Visit

## 2024-03-20 ENCOUNTER — Ambulatory Visit

## 2024-03-30 ENCOUNTER — Other Ambulatory Visit: Payer: Self-pay | Admitting: Physician Assistant

## 2024-03-30 DIAGNOSIS — N3941 Urge incontinence: Secondary | ICD-10-CM

## 2024-04-18 ENCOUNTER — Ambulatory Visit: Admitting: Physician Assistant

## 2024-04-18 VITALS — BP 121/64 | HR 96 | Ht 68.0 in | Wt 150.0 lb

## 2024-04-18 DIAGNOSIS — R339 Retention of urine, unspecified: Secondary | ICD-10-CM

## 2024-04-18 DIAGNOSIS — R8271 Bacteriuria: Secondary | ICD-10-CM | POA: Diagnosis not present

## 2024-04-18 DIAGNOSIS — N3941 Urge incontinence: Secondary | ICD-10-CM

## 2024-04-18 LAB — BLADDER SCAN AMB NON-IMAGING

## 2024-04-18 MED ORDER — MIRABEGRON ER 50 MG PO TB24: 50.0000 mg | ORAL_TABLET | Freq: Every day | ORAL | 1 refills | Status: DC

## 2024-04-18 NOTE — Progress Notes (Signed)
 04/18/2024 10:28 AM   Logan Morgan 1941-02-10 [id number]  CC: Chief Complaint  Patient presents with   Follow-up   Urinary Incontinence   HPI: Logan Morgan is a 83 y.o. male with PMH prostate cancer s/p brachytherapy with undetectable PSA, small right renal mass on active surveillance, BPH with incomplete bladder emptying on Flomax , and urge incontinence who failed Gemtesa (inefficacy) and trospium  IR (dry mouth and constipation) who presents today for symptom recheck on trospium  ER 60mg .   Today he reports continued dry mouth and constipation on trospium  ER.  He has remained dry, but still has some urge episodes.  He expresses frustration today.  He states that his PCP has been treating him for recurrent UTIs and he was just informed that this could be due to his incomplete emptying.  He feels that this was not well explained in the past by either his primary or urology teams.  He wonders why he is not seeing an MD today and has questions about my credentials.  He describes 1 to 2 weeks of discomfort with initiation.  He hesitates to call it burning.  On chart review, he has grown E faecalis on every urine culture he has had since 2017.  He picked up amoxicillin per his PCP this morning, but has not yet started it.  He states this discomfort is not typical for him at baseline and that he has a remote history of UTIs 20 to 30 years ago, but has not had any irritative voiding symptoms since that time.  PVR 292 mL, previously 288 mL.  PMH: Past Medical History:  Diagnosis Date   Atrial fibrillation (HCC)    COPD (chronic obstructive pulmonary disease) (HCC)    Dyspnea    Pt reports due to inactivity   Dysrhythmia    Emphysema lung (HCC)    Prostate cancer [facility])     Surgical History: Past Surgical History:  Procedure Laterality Date   CATARACT EXTRACTION W/PHACO Left 09/15/2021   Procedure: CATARACT EXTRACTION PHACO AND INTRAOCULAR LENS PLACEMENT (IOC) LEFT 12.74 01:04.8;   Surgeon: Myrna Adine Anes, MD;  Location: [facility];  Service: Ophthalmology;  Laterality: Left;   CATARACT EXTRACTION W/PHACO Right 09/29/2021   Procedure: CATARACT EXTRACTION PHACO AND INTRAOCULAR LENS PLACEMENT (IOC) RIGHT;  Surgeon: Myrna Adine Anes, MD;  Location: [facility];  Service: Ophthalmology;  Laterality: Right;  14.81 01:15.8   COLONOSCOPY     COLONOSCOPY WITH PROPOFOL  N/A 10/28/2021   Procedure: COLONOSCOPY WITH PROPOFOL ;  Surgeon: Jinny Carmine, MD;  Location: [facility] ENDOSCOPY;  Service: Endoscopy;  Laterality: N/A;   ESOPHAGOGASTRODUODENOSCOPY N/A 10/28/2021   Procedure: ESOPHAGOGASTRODUODENOSCOPY (EGD);  Surgeon: Jinny Carmine, MD;  Location: [facility] ENDOSCOPY;  Service: Endoscopy;  Laterality: N/A;   EYE SURGERY     GIVENS CAPSULE STUDY N/A 11/24/2021   Procedure: GIVENS CAPSULE STUDY;  Surgeon: Jinny Carmine, MD;  Location: [facility] ENDOSCOPY;  Service: Endoscopy;  Laterality: N/A;   HAND SURGERY     x3. as child   RADIOACTIVE SEED IMPLANT     TONSILLECTOMY     as child    Home Medications:  Allergies as of 04/18/2024   No Known Allergies      Medication List        Accurate as of April 18, 2024 10:28 AM. If you have any questions, ask your nurse or doctor.          STOP taking these medications    ciprofloxacin  250 MG tablet Commonly known  as: Cipro    furosemide 20 MG tablet Commonly known as: LASIX       TAKE these medications    albuterol  108 (90 Base) MCG/ACT inhaler Commonly known as: VENTOLIN  HFA Inhale 2 puffs into the lungs every 6 (six) hours as needed for wheezing or shortness of breath. DX: J43.2   amiodarone  200 MG tablet Commonly known as: PACERONE  Take 200 mg by mouth daily.   atorvastatin 20 MG tablet Commonly known as: LIPITOR Take 20 mg by mouth daily.   Bevespi  Aerosphere 9-4.8 MCG/ACT Aero Generic drug: Glycopyrrolate-Formoterol  Inhale 2 puffs into the lungs 2 (two) times daily.   ferrous sulfate 325  (65 FE) MG tablet Take 325 mg by mouth daily.   Fish Oil 1000 MG Caps Take by mouth daily.   glucosamine-chondroitin 500-400 MG tablet Take 1 tablet by mouth 2 (two) times daily.   metoprolol  succinate 25 MG 24 hr tablet Commonly known as: TOPROL -XL Take 25 mg by mouth daily.   olmesartan 40 MG tablet Commonly known as: BENICAR Take by mouth.   ONE-A-DAY MENS PO Take 1 tablet by mouth daily.   potassium chloride  SA 20 MEQ tablet Commonly known as: KLOR-CON  M Take 1 tablet (20 mEq total) by mouth daily. What changed: when to take this   pravastatin  40 MG tablet Commonly known as: PRAVACHOL  Take 40 mg by mouth daily.   rivaroxaban  20 MG Tabs tablet Commonly known as: XARELTO  Take 20 mg by mouth daily with supper.   sertraline  100 MG tablet Commonly known as: ZOLOFT  Take 150 mg by mouth daily.   tamsulosin  0.4 MG Caps capsule Commonly known as: FLOMAX  Take 0.4 mg by mouth daily.   Trospium  Chloride 60 MG Cp24 Take 1 capsule (60 mg total) by mouth daily.        Allergies:  No Known Allergies  Family History: Family History  Problem Relation Age of Onset   COPD Mother     Social History:   reports that he quit smoking about 27 years ago. His smoking use included cigarettes. He started smoking about 65 years ago. He has never used smokeless tobacco. He reports current alcohol use of about 18.0 standard drinks of alcohol per week. He reports that he does not use drugs.  Physical Exam: BP 121/64 (BP Location: Left Arm, Patient Position: Sitting, Cuff Size: Normal)   Pulse 96   Ht 5' 8 (1.727 m)   Wt 150 lb (68 kg)   SpO2 93%   BMI 22.81 kg/m   Constitutional:  Alert and oriented, no acute distress, nontoxic appearing HEENT: Goodnight, AT Cardiovascular: No clubbing, cyanosis, or edema Respiratory: Normal respiratory effort, no increased work of breathing Skin: No rashes, bruises or suspicious lesions Neurologic: Grossly intact, no focal deficits, moving all  4 extremities Psychiatric: Normal mood and affect  Laboratory Data: Results for orders placed or performed in visit on 04/18/24  Bladder Scan (Post Void Residual) in office   Collection Time: 04/18/24 10:22 AM  Result Value Ref Range   Scan Result    Assessment & Plan:   1. Urge incontinence (Primary) Dry on trospium , though with bothersome anticholinergic side effects.  I am stopping trospium .  Will try Myrbetriq 50 mg as an alternative, and if this is either ineffective or cost prohibitive, we discussed PTNS as an alternative. - Bladder Scan (Post Void Residual) in office - mirabegron ER (MYRBETRIQ) 50 MG TB24 tablet; Take 1 tablet (50 mg total) by mouth daily.  Dispense: 30 tablet;  Refill: 1  2. Incomplete bladder emptying Stable.  Possibly contributory to #3 below.  Will continue to monitor.  We did discuss that this is a chronic finding and that we have been monitoring it closely with serial bladder scans. - Bladder Scan (Post Void Residual) in office  3. Bacteriuria He is chronically colonized with E faecalis.  I do not recommend antibiotic therapy unless he is having acute irritative voiding symptoms.  He can discuss this further with Dr. Francisca at follow-up.  I offered him an ID referral to get their thoughts as well, but he prefers to defer this for now.  We did discuss that I feel it would be appropriate for him to take the amoxicillin he was just prescribed in light of his reports of 1 to 2 weeks of discomfort with voiding.  Alternatively, we discussed holding off to see if his symptoms worsen in the coming days, at which point he could start it at that time.  Return in about 6 weeks (around 05/30/2024) for Symptom recheck with PVR with Dr. Francisca.  Lucie Hones, PA-C  [facility] [place] [address] [place], [place] [address] [phone]

## 2024-05-24 ENCOUNTER — Ambulatory Visit: Admitting: Urology

## 2024-05-24 VITALS — BP 121/73 | HR 81 | Ht 68.0 in | Wt 155.0 lb

## 2024-05-24 DIAGNOSIS — R350 Frequency of micturition: Secondary | ICD-10-CM | POA: Diagnosis not present

## 2024-05-24 DIAGNOSIS — R339 Retention of urine, unspecified: Secondary | ICD-10-CM | POA: Diagnosis not present

## 2024-05-24 DIAGNOSIS — C61 Malignant neoplasm of prostate: Secondary | ICD-10-CM | POA: Diagnosis not present

## 2024-05-24 DIAGNOSIS — N3941 Urge incontinence: Secondary | ICD-10-CM | POA: Diagnosis not present

## 2024-05-24 DIAGNOSIS — N2889 Other specified disorders of kidney and ureter: Secondary | ICD-10-CM

## 2024-05-24 LAB — BLADDER SCAN AMB NON-IMAGING

## 2024-05-24 MED ORDER — MIRABEGRON ER 50 MG PO TB24: 50.0000 mg | ORAL_TABLET | Freq: Every day | ORAL | 11 refills | Status: DC

## 2024-05-24 MED ORDER — TAMSULOSIN HCL 0.4 MG PO CAPS: 0.4000 mg | ORAL_CAPSULE | Freq: Every day | ORAL | 3 refills | Status: AC

## 2024-05-24 MED ORDER — MIRABEGRON ER 50 MG PO TB24: 50.0000 mg | ORAL_TABLET | Freq: Every day | ORAL | 3 refills | Status: AC

## 2024-05-24 NOTE — Patient Instructions (Addendum)
 We currently would like you to be on Myrbetriq (mirabegron ) and Flomax (tamsulosin ) for your urinary symptoms.  You previously trialed Gemtesa(no improvement) and trospium (side effects of dry mouth and constipation), which were both overactive bladder medications.  You should not be still taking those.

## 2024-05-24 NOTE — Progress Notes (Signed)
° °  05/24/2024 12:08 PM   Logan Morgan September 06, 1940 [id number]  Reason for visit: Follow up urinary symptoms/OAB, small right renal mass, microscopic hematuria, history of prostate cancer, chronic bacteriuria  History: Previously followed by Dr. Penne, seen by me initially July 2025 after she left the practice Distant history of prostate cancer over 15 years ago treated with brachytherapy, PSA has remained undetectable Very small 1.4 cm right upper pole renal mass stable on surveillance since January 2022 Rare episodes of urge incontinence approximately 5-10 times per month that are very bothersome Regarding urinary symptoms, no improvement on Gemtesa, improvement but bothersome dry mouth/constipation on trospium , currently on Myrbetriq  Has been on Flomax  long-term Multiple urine cultures over the last 10 years positive for Enterococcus without symptoms of dysuria, suspect chronic colonization Microscopic hematuria workup negative with normal cystoscopy July 2025  Physical Exam: BP 121/73 (BP Location: Left Arm, Patient Position: Sitting, Cuff Size: Normal)   Pulse 81   Ht 5' 8 (1.727 m)   Wt 155 lb (70.3 kg)   SpO2 90%   BMI 23.57 kg/m   Imaging/labs: PSA undetectable I personally viewed and interpreted the MRI from January 2025 showing a stable 1.4 cm right renal lesion  Today: Remains frustrated with different medicines for his urinary symptoms, but sounds like he is overall doing well on the Myrbetriq  Bladder scan today , but denies urge to void and has been a few hours since he urinated No dysuria or gross hematuria, no flank pain  Plan:   Renal mass: Small and stable, continue active surveillance, will repeat MRI or CT next year based on stability over the last 3 years Prostate cancer: PSA remains undetectable, reassurance provided Microscopic hematuria: Negative cystoscopy July 2025 Overactive bladder: Symptoms seem to be well-controlled on daily Myrbetriq  at  this time, he also feels like his symptoms of urge incontinence a few times a month are associated with flares of emphysema.  Need to have some realistic expectations RTC 1 year PVR, MRI abdomen or CT urogram prior   Logan JAYSON Burnet, MD  [facility] [address], [address] [place], [place] [address] [phone]

## 2025-05-24 ENCOUNTER — Ambulatory Visit: Admitting: Urology
